# Patient Record
Sex: Male | Born: 1935 | Race: White | Hispanic: No | Marital: Married | Smoking: Former smoker
Health system: Southern US, Community
[De-identification: ages and names within clinical notes are randomized; demographics above are authoritative.]

## PROBLEM LIST (undated history)

## (undated) DIAGNOSIS — I5022 Chronic systolic (congestive) heart failure: Secondary | ICD-10-CM

## (undated) DIAGNOSIS — E785 Hyperlipidemia, unspecified: Secondary | ICD-10-CM

## (undated) DIAGNOSIS — I255 Ischemic cardiomyopathy: Secondary | ICD-10-CM

## (undated) DIAGNOSIS — N183 Chronic kidney disease, stage 3 unspecified: Secondary | ICD-10-CM

## (undated) DIAGNOSIS — M1990 Unspecified osteoarthritis, unspecified site: Secondary | ICD-10-CM

## (undated) DIAGNOSIS — Z95810 Presence of automatic (implantable) cardiac defibrillator: Secondary | ICD-10-CM

## (undated) DIAGNOSIS — E119 Type 2 diabetes mellitus without complications: Secondary | ICD-10-CM

## (undated) DIAGNOSIS — I2510 Atherosclerotic heart disease of native coronary artery without angina pectoris: Secondary | ICD-10-CM

## (undated) DIAGNOSIS — D649 Anemia, unspecified: Secondary | ICD-10-CM

## (undated) DIAGNOSIS — K635 Polyp of colon: Secondary | ICD-10-CM

## (undated) DIAGNOSIS — J449 Chronic obstructive pulmonary disease, unspecified: Secondary | ICD-10-CM

## (undated) DIAGNOSIS — T8859XA Other complications of anesthesia, initial encounter: Secondary | ICD-10-CM

## (undated) DIAGNOSIS — I10 Essential (primary) hypertension: Secondary | ICD-10-CM

## (undated) DIAGNOSIS — T4145XA Adverse effect of unspecified anesthetic, initial encounter: Secondary | ICD-10-CM

## (undated) DIAGNOSIS — I4891 Unspecified atrial fibrillation: Secondary | ICD-10-CM

## (undated) DIAGNOSIS — K219 Gastro-esophageal reflux disease without esophagitis: Secondary | ICD-10-CM

## (undated) DIAGNOSIS — I340 Nonrheumatic mitral (valve) insufficiency: Secondary | ICD-10-CM

## (undated) HISTORY — DX: Chronic kidney disease, stage 3 (moderate): N18.3

## (undated) HISTORY — DX: Ischemic cardiomyopathy: I25.5

## (undated) HISTORY — DX: Chronic kidney disease, stage 3 unspecified: N18.30

## (undated) HISTORY — DX: Unspecified atrial fibrillation: I48.91

## (undated) HISTORY — PX: CARDIAC DEFIBRILLATOR PLACEMENT: SHX171

## (undated) HISTORY — PX: INGUINAL HERNIA REPAIR: SUR1180

## (undated) HISTORY — DX: Atherosclerotic heart disease of native coronary artery without angina pectoris: I25.10

## (undated) HISTORY — DX: Gastro-esophageal reflux disease without esophagitis: K21.9

## (undated) HISTORY — PX: MITRAL VALVE REPAIR: SHX2039

## (undated) HISTORY — DX: Polyp of colon: K63.5

## (undated) HISTORY — DX: Chronic systolic (congestive) heart failure: I50.22

## (undated) HISTORY — PX: CERVICAL DISCECTOMY: SHX98

## (undated) HISTORY — DX: Hyperlipidemia, unspecified: E78.5

## (undated) HISTORY — DX: Anemia, unspecified: D64.9

## (undated) HISTORY — PX: ANTERIOR FUSION CERVICAL SPINE: SUR626

## (undated) HISTORY — PX: EXCISIONAL HEMORRHOIDECTOMY: SHX1541

---

## 1993-03-06 HISTORY — PX: CORONARY ARTERY BYPASS GRAFT: SHX141

## 1999-06-03 ENCOUNTER — Ambulatory Visit: Admission: RE | Admit: 1999-06-03 | Discharge: 1999-06-03 | Payer: Self-pay | Admitting: Cardiology

## 2000-03-06 HISTORY — PX: CORONARY ARTERY BYPASS GRAFT: SHX141

## 2000-03-27 ENCOUNTER — Ambulatory Visit (HOSPITAL_COMMUNITY): Admission: RE | Admit: 2000-03-27 | Discharge: 2000-03-28 | Payer: Self-pay | Admitting: Cardiology

## 2000-03-30 ENCOUNTER — Ambulatory Visit (HOSPITAL_COMMUNITY): Admission: RE | Admit: 2000-03-30 | Discharge: 2000-03-31 | Payer: Self-pay | Admitting: Cardiology

## 2001-05-30 ENCOUNTER — Inpatient Hospital Stay (HOSPITAL_COMMUNITY): Admission: AD | Admit: 2001-05-30 | Discharge: 2001-06-02 | Payer: Self-pay | Admitting: Cardiology

## 2001-11-04 ENCOUNTER — Inpatient Hospital Stay (HOSPITAL_COMMUNITY): Admission: EM | Admit: 2001-11-04 | Discharge: 2001-11-08 | Payer: Self-pay | Admitting: Emergency Medicine

## 2001-11-04 ENCOUNTER — Encounter: Payer: Self-pay | Admitting: Emergency Medicine

## 2001-11-06 ENCOUNTER — Encounter: Payer: Self-pay | Admitting: Cardiology

## 2001-12-11 ENCOUNTER — Ambulatory Visit (HOSPITAL_COMMUNITY): Admission: RE | Admit: 2001-12-11 | Discharge: 2001-12-12 | Payer: Self-pay | Admitting: Internal Medicine

## 2001-12-12 ENCOUNTER — Encounter: Payer: Self-pay | Admitting: Internal Medicine

## 2002-02-03 ENCOUNTER — Ambulatory Visit (HOSPITAL_COMMUNITY): Admission: RE | Admit: 2002-02-03 | Discharge: 2002-02-03 | Payer: Self-pay | Admitting: Cardiology

## 2002-02-03 ENCOUNTER — Encounter: Payer: Self-pay | Admitting: Cardiology

## 2002-08-19 ENCOUNTER — Ambulatory Visit (HOSPITAL_COMMUNITY): Admission: RE | Admit: 2002-08-19 | Discharge: 2002-08-19 | Payer: Self-pay | Admitting: Cardiology

## 2002-08-19 ENCOUNTER — Encounter: Payer: Self-pay | Admitting: Cardiology

## 2003-03-31 ENCOUNTER — Ambulatory Visit (HOSPITAL_COMMUNITY): Admission: RE | Admit: 2003-03-31 | Discharge: 2003-03-31 | Payer: Self-pay | Admitting: Cardiology

## 2003-06-04 ENCOUNTER — Inpatient Hospital Stay (HOSPITAL_COMMUNITY): Admission: EM | Admit: 2003-06-04 | Discharge: 2003-06-06 | Payer: Self-pay | Admitting: Emergency Medicine

## 2003-10-01 ENCOUNTER — Other Ambulatory Visit: Payer: Self-pay

## 2003-10-22 ENCOUNTER — Encounter: Admission: RE | Admit: 2003-10-22 | Discharge: 2003-10-22 | Payer: Self-pay | Admitting: Emergency Medicine

## 2004-04-07 ENCOUNTER — Ambulatory Visit: Payer: Self-pay | Admitting: Cardiology

## 2004-04-07 ENCOUNTER — Ambulatory Visit: Payer: Self-pay | Admitting: Internal Medicine

## 2004-06-08 ENCOUNTER — Ambulatory Visit: Payer: Self-pay | Admitting: Cardiology

## 2004-06-08 ENCOUNTER — Inpatient Hospital Stay (HOSPITAL_COMMUNITY): Admission: EM | Admit: 2004-06-08 | Discharge: 2004-06-20 | Payer: Self-pay | Admitting: *Deleted

## 2004-06-10 ENCOUNTER — Encounter: Payer: Self-pay | Admitting: Cardiology

## 2004-06-21 ENCOUNTER — Ambulatory Visit: Payer: Self-pay | Admitting: Cardiology

## 2004-06-23 ENCOUNTER — Ambulatory Visit: Payer: Self-pay | Admitting: Cardiology

## 2004-06-28 ENCOUNTER — Ambulatory Visit: Payer: Self-pay | Admitting: *Deleted

## 2004-06-29 ENCOUNTER — Ambulatory Visit: Payer: Self-pay | Admitting: Cardiology

## 2004-07-07 ENCOUNTER — Ambulatory Visit: Payer: Self-pay | Admitting: Internal Medicine

## 2004-07-14 ENCOUNTER — Inpatient Hospital Stay (HOSPITAL_COMMUNITY): Admission: EM | Admit: 2004-07-14 | Discharge: 2004-07-19 | Payer: Self-pay | Admitting: Emergency Medicine

## 2004-07-14 ENCOUNTER — Ambulatory Visit: Payer: Self-pay | Admitting: Internal Medicine

## 2004-07-15 ENCOUNTER — Ambulatory Visit: Payer: Self-pay | Admitting: Cardiology

## 2004-07-15 ENCOUNTER — Encounter: Payer: Self-pay | Admitting: Cardiology

## 2004-07-25 ENCOUNTER — Ambulatory Visit: Payer: Self-pay | Admitting: Cardiology

## 2004-08-18 ENCOUNTER — Ambulatory Visit: Payer: Self-pay | Admitting: Internal Medicine

## 2004-09-26 ENCOUNTER — Ambulatory Visit: Payer: Self-pay | Admitting: Cardiology

## 2004-12-22 ENCOUNTER — Ambulatory Visit: Payer: Self-pay | Admitting: Internal Medicine

## 2004-12-22 ENCOUNTER — Ambulatory Visit: Payer: Self-pay | Admitting: Cardiology

## 2005-04-25 ENCOUNTER — Ambulatory Visit: Payer: Self-pay | Admitting: Internal Medicine

## 2005-05-18 ENCOUNTER — Ambulatory Visit: Payer: Self-pay | Admitting: Cardiology

## 2005-06-13 ENCOUNTER — Ambulatory Visit (HOSPITAL_COMMUNITY): Admission: RE | Admit: 2005-06-13 | Discharge: 2005-06-13 | Payer: Self-pay | Admitting: Neurosurgery

## 2005-06-15 ENCOUNTER — Ambulatory Visit (HOSPITAL_COMMUNITY): Admission: RE | Admit: 2005-06-15 | Discharge: 2005-06-15 | Payer: Self-pay | Admitting: Neurosurgery

## 2005-07-10 ENCOUNTER — Ambulatory Visit: Payer: Self-pay | Admitting: Cardiology

## 2005-07-19 ENCOUNTER — Ambulatory Visit: Payer: Self-pay | Admitting: Cardiology

## 2005-07-28 ENCOUNTER — Ambulatory Visit (HOSPITAL_COMMUNITY): Admission: RE | Admit: 2005-07-28 | Discharge: 2005-07-28 | Payer: Self-pay | Admitting: Internal Medicine

## 2005-08-02 ENCOUNTER — Ambulatory Visit: Payer: Self-pay | Admitting: Internal Medicine

## 2005-08-02 ENCOUNTER — Inpatient Hospital Stay (HOSPITAL_COMMUNITY): Admission: RE | Admit: 2005-08-02 | Discharge: 2005-08-02 | Payer: Self-pay | Admitting: Internal Medicine

## 2005-08-21 ENCOUNTER — Ambulatory Visit: Payer: Self-pay | Admitting: Internal Medicine

## 2005-11-22 ENCOUNTER — Ambulatory Visit: Payer: Self-pay | Admitting: Cardiology

## 2006-03-01 ENCOUNTER — Ambulatory Visit: Payer: Self-pay | Admitting: Internal Medicine

## 2006-03-01 ENCOUNTER — Ambulatory Visit: Payer: Self-pay | Admitting: Cardiology

## 2006-03-08 ENCOUNTER — Ambulatory Visit: Payer: Self-pay | Admitting: Cardiology

## 2006-03-12 ENCOUNTER — Ambulatory Visit: Payer: Self-pay | Admitting: Cardiology

## 2006-03-19 ENCOUNTER — Ambulatory Visit: Payer: Self-pay | Admitting: Internal Medicine

## 2006-03-19 ENCOUNTER — Ambulatory Visit: Payer: Self-pay

## 2006-03-27 ENCOUNTER — Ambulatory Visit: Payer: Self-pay | Admitting: Internal Medicine

## 2006-03-27 ENCOUNTER — Ambulatory Visit: Payer: Self-pay | Admitting: *Deleted

## 2006-04-10 ENCOUNTER — Ambulatory Visit: Payer: Self-pay | Admitting: Cardiology

## 2006-04-10 ENCOUNTER — Ambulatory Visit: Payer: Self-pay | Admitting: Internal Medicine

## 2006-04-10 LAB — CONVERTED CEMR LAB
CO2: 29 meq/L (ref 19–32)
Chloride: 108 meq/L (ref 96–112)
Creatinine, Ser: 1.2 mg/dL (ref 0.4–1.5)
Eosinophils Relative: 1.2 % (ref 0.0–5.0)
Glucose, Bld: 160 mg/dL — ABNORMAL HIGH (ref 70–99)
HCT: 46.1 % (ref 39.0–52.0)
Hemoglobin: 15.7 g/dL (ref 13.0–17.0)
Neutrophils Relative %: 68 % (ref 43.0–77.0)
RBC: 4.79 M/uL (ref 4.22–5.81)
RDW: 16.1 % — ABNORMAL HIGH (ref 11.5–14.6)
Sodium: 141 meq/L (ref 135–145)
WBC: 5.8 10*3/uL (ref 4.5–10.5)

## 2006-04-17 ENCOUNTER — Ambulatory Visit (HOSPITAL_COMMUNITY): Admission: RE | Admit: 2006-04-17 | Discharge: 2006-04-17 | Payer: Self-pay | Admitting: Internal Medicine

## 2006-04-24 ENCOUNTER — Ambulatory Visit: Payer: Self-pay | Admitting: Cardiology

## 2006-05-08 ENCOUNTER — Ambulatory Visit: Payer: Self-pay | Admitting: Internal Medicine

## 2006-05-15 ENCOUNTER — Ambulatory Visit: Payer: Self-pay | Admitting: Internal Medicine

## 2006-05-15 ENCOUNTER — Ambulatory Visit (HOSPITAL_COMMUNITY): Admission: RE | Admit: 2006-05-15 | Discharge: 2006-05-15 | Payer: Self-pay | Admitting: Internal Medicine

## 2006-06-20 ENCOUNTER — Ambulatory Visit: Payer: Self-pay | Admitting: Pain Medicine

## 2006-06-26 ENCOUNTER — Ambulatory Visit: Payer: Self-pay | Admitting: Cardiology

## 2006-06-26 LAB — CONVERTED CEMR LAB
CO2: 30 meq/L (ref 19–32)
Creatinine, Ser: 1.2 mg/dL (ref 0.4–1.5)
Glucose, Bld: 133 mg/dL — ABNORMAL HIGH (ref 70–99)
Potassium: 3.9 meq/L (ref 3.5–5.1)
Sodium: 141 meq/L (ref 135–145)

## 2006-07-04 ENCOUNTER — Ambulatory Visit: Payer: Self-pay | Admitting: Pain Medicine

## 2006-07-05 ENCOUNTER — Ambulatory Visit: Payer: Self-pay | Admitting: Internal Medicine

## 2006-07-05 LAB — CONVERTED CEMR LAB
Chloride: 104 meq/L (ref 96–112)
Creatinine, Ser: 1.1 mg/dL (ref 0.4–1.5)
Glucose, Bld: 108 mg/dL — ABNORMAL HIGH (ref 70–99)
Magnesium: 2 mg/dL (ref 1.5–2.5)
Prothrombin Time: 21.5 s — ABNORMAL HIGH (ref 10.0–14.0)
Sodium: 141 meq/L (ref 135–145)

## 2006-07-10 ENCOUNTER — Inpatient Hospital Stay (HOSPITAL_COMMUNITY): Admission: AD | Admit: 2006-07-10 | Discharge: 2006-07-14 | Payer: Self-pay | Admitting: Internal Medicine

## 2006-07-10 ENCOUNTER — Ambulatory Visit: Payer: Self-pay | Admitting: Internal Medicine

## 2006-08-09 ENCOUNTER — Ambulatory Visit: Payer: Self-pay | Admitting: Cardiovascular Disease

## 2006-09-04 ENCOUNTER — Ambulatory Visit: Payer: Self-pay | Admitting: Cardiology

## 2006-10-02 ENCOUNTER — Ambulatory Visit: Payer: Self-pay | Admitting: Cardiology

## 2006-11-06 ENCOUNTER — Ambulatory Visit: Payer: Self-pay | Admitting: Internal Medicine

## 2006-12-24 ENCOUNTER — Ambulatory Visit: Payer: Self-pay | Admitting: Cardiology

## 2007-01-02 ENCOUNTER — Inpatient Hospital Stay (HOSPITAL_COMMUNITY): Admission: AD | Admit: 2007-01-02 | Discharge: 2007-01-05 | Payer: Self-pay | Admitting: Cardiology

## 2007-01-02 ENCOUNTER — Inpatient Hospital Stay (HOSPITAL_BASED_OUTPATIENT_CLINIC_OR_DEPARTMENT_OTHER): Admission: RE | Admit: 2007-01-02 | Discharge: 2007-01-02 | Payer: Self-pay | Admitting: Cardiology

## 2007-01-02 ENCOUNTER — Ambulatory Visit: Payer: Self-pay | Admitting: Cardiology

## 2007-01-10 ENCOUNTER — Ambulatory Visit: Payer: Self-pay | Admitting: Cardiology

## 2007-01-29 ENCOUNTER — Ambulatory Visit: Payer: Self-pay | Admitting: Cardiology

## 2007-02-12 ENCOUNTER — Ambulatory Visit: Payer: Self-pay | Admitting: Cardiology

## 2007-02-12 LAB — CONVERTED CEMR LAB
INR: 1.4 — ABNORMAL HIGH (ref 0.8–1.0)
Prothrombin Time: 14.4 s — ABNORMAL HIGH (ref 10.9–13.3)

## 2007-02-20 ENCOUNTER — Ambulatory Visit: Payer: Self-pay | Admitting: Cardiology

## 2007-02-20 LAB — CONVERTED CEMR LAB: aPTT: 32.3 s — ABNORMAL HIGH (ref 21.7–29.8)

## 2007-02-25 ENCOUNTER — Ambulatory Visit: Payer: Self-pay

## 2007-03-11 ENCOUNTER — Ambulatory Visit: Payer: Self-pay | Admitting: Cardiology

## 2007-03-29 ENCOUNTER — Ambulatory Visit: Payer: Self-pay | Admitting: Cardiology

## 2007-03-29 LAB — CONVERTED CEMR LAB
Basophils Relative: 0.7 % (ref 0.0–1.0)
CO2: 30 meq/L (ref 19–32)
Eosinophils Relative: 1.4 % (ref 0.0–5.0)
GFR calc Af Amer: 85 mL/min
Glucose, Bld: 153 mg/dL — ABNORMAL HIGH (ref 70–99)
Hemoglobin: 15.3 g/dL (ref 13.0–17.0)
Lymphocytes Relative: 31.8 % (ref 12.0–46.0)
Monocytes Absolute: 0.6 10*3/uL (ref 0.2–0.7)
Monocytes Relative: 12.8 % — ABNORMAL HIGH (ref 3.0–11.0)
Neutro Abs: 2.3 10*3/uL (ref 1.4–7.7)
Potassium: 3.9 meq/L (ref 3.5–5.1)
Prothrombin Time: 20.7 s — ABNORMAL HIGH (ref 10.9–13.3)
Sodium: 136 meq/L (ref 135–145)

## 2007-04-22 ENCOUNTER — Ambulatory Visit: Payer: Self-pay | Admitting: Cardiology

## 2007-05-20 ENCOUNTER — Ambulatory Visit: Payer: Self-pay | Admitting: Cardiology

## 2007-05-20 LAB — CONVERTED CEMR LAB
GFR calc Af Amer: 85 mL/min
GFR calc non Af Amer: 71 mL/min
Glucose, Bld: 141 mg/dL — ABNORMAL HIGH (ref 70–99)
Sodium: 141 meq/L (ref 135–145)

## 2007-07-03 ENCOUNTER — Encounter: Admission: RE | Admit: 2007-07-03 | Discharge: 2007-07-03 | Payer: Self-pay | Admitting: Neurosurgery

## 2007-07-17 ENCOUNTER — Ambulatory Visit (HOSPITAL_COMMUNITY): Admission: RE | Admit: 2007-07-17 | Discharge: 2007-07-17 | Payer: Self-pay | Admitting: Neurosurgery

## 2007-07-24 ENCOUNTER — Ambulatory Visit (HOSPITAL_COMMUNITY): Admission: RE | Admit: 2007-07-24 | Discharge: 2007-07-25 | Payer: Self-pay | Admitting: Neurosurgery

## 2007-08-05 ENCOUNTER — Ambulatory Visit: Payer: Self-pay | Admitting: Cardiology

## 2007-08-19 ENCOUNTER — Ambulatory Visit: Payer: Self-pay | Admitting: Internal Medicine

## 2007-11-05 ENCOUNTER — Ambulatory Visit: Payer: Self-pay | Admitting: Cardiology

## 2007-11-05 LAB — CONVERTED CEMR LAB
Calcium: 9.2 mg/dL (ref 8.4–10.5)
Creatinine, Ser: 1.2 mg/dL (ref 0.4–1.5)
GFR calc non Af Amer: 64 mL/min

## 2007-11-18 ENCOUNTER — Ambulatory Visit: Payer: Self-pay | Admitting: Cardiology

## 2007-12-05 ENCOUNTER — Encounter: Admission: RE | Admit: 2007-12-05 | Discharge: 2007-12-05 | Payer: Self-pay | Admitting: Otolaryngology

## 2007-12-26 ENCOUNTER — Ambulatory Visit: Payer: Self-pay | Admitting: Cardiology

## 2007-12-27 ENCOUNTER — Inpatient Hospital Stay (HOSPITAL_COMMUNITY): Admission: EM | Admit: 2007-12-27 | Discharge: 2007-12-31 | Payer: Self-pay | Admitting: Emergency Medicine

## 2008-01-07 ENCOUNTER — Ambulatory Visit: Payer: Self-pay | Admitting: Cardiology

## 2008-01-07 LAB — CONVERTED CEMR LAB
BUN: 33 mg/dL — ABNORMAL HIGH (ref 6–23)
Chloride: 101 meq/L (ref 96–112)
Eosinophils Absolute: 0.1 10*3/uL (ref 0.0–0.7)
Eosinophils Relative: 1.8 % (ref 0.0–5.0)
GFR calc non Af Amer: 64 mL/min
MCV: 97.1 fL (ref 78.0–100.0)
Monocytes Relative: 9 % (ref 3.0–12.0)
Neutrophils Relative %: 64.3 % (ref 43.0–77.0)
Platelets: 145 10*3/uL — ABNORMAL LOW (ref 150–400)
Potassium: 4.3 meq/L (ref 3.5–5.1)
RDW: 14.3 % (ref 11.5–14.6)
Sodium: 138 meq/L (ref 135–145)
WBC: 4.5 10*3/uL (ref 4.5–10.5)

## 2008-02-17 ENCOUNTER — Ambulatory Visit: Payer: Self-pay | Admitting: Cardiology

## 2008-04-22 ENCOUNTER — Encounter: Payer: Self-pay | Admitting: Internal Medicine

## 2008-05-04 ENCOUNTER — Ambulatory Visit: Payer: Self-pay | Admitting: Internal Medicine

## 2008-07-11 DIAGNOSIS — I2589 Other forms of chronic ischemic heart disease: Secondary | ICD-10-CM | POA: Insufficient documentation

## 2008-07-11 DIAGNOSIS — N259 Disorder resulting from impaired renal tubular function, unspecified: Secondary | ICD-10-CM | POA: Insufficient documentation

## 2008-07-11 DIAGNOSIS — I4891 Unspecified atrial fibrillation: Secondary | ICD-10-CM

## 2008-07-11 DIAGNOSIS — E785 Hyperlipidemia, unspecified: Secondary | ICD-10-CM | POA: Insufficient documentation

## 2008-07-11 DIAGNOSIS — E119 Type 2 diabetes mellitus without complications: Secondary | ICD-10-CM

## 2008-07-21 ENCOUNTER — Telehealth: Payer: Self-pay | Admitting: Cardiology

## 2008-07-22 ENCOUNTER — Encounter: Payer: Self-pay | Admitting: Physician Assistant

## 2008-07-22 ENCOUNTER — Inpatient Hospital Stay (HOSPITAL_COMMUNITY): Admission: AD | Admit: 2008-07-22 | Discharge: 2008-07-24 | Payer: Self-pay | Admitting: Cardiology

## 2008-07-22 ENCOUNTER — Ambulatory Visit: Payer: Self-pay | Admitting: Cardiology

## 2008-07-22 DIAGNOSIS — I25810 Atherosclerosis of coronary artery bypass graft(s) without angina pectoris: Secondary | ICD-10-CM | POA: Insufficient documentation

## 2008-07-23 ENCOUNTER — Encounter: Payer: Self-pay | Admitting: Cardiology

## 2008-08-17 ENCOUNTER — Ambulatory Visit: Payer: Self-pay | Admitting: Internal Medicine

## 2008-08-26 ENCOUNTER — Encounter: Payer: Self-pay | Admitting: Physician Assistant

## 2008-08-26 ENCOUNTER — Ambulatory Visit: Payer: Self-pay | Admitting: Internal Medicine

## 2008-08-27 ENCOUNTER — Encounter: Payer: Self-pay | Admitting: Cardiology

## 2008-09-10 LAB — CONVERTED CEMR LAB
CO2: 30 meq/L (ref 19–32)
Chloride: 102 meq/L (ref 96–112)
Creatinine, Ser: 1.4 mg/dL (ref 0.4–1.5)
Leukocytes, UA: NEGATIVE
Nitrite: NEGATIVE
Potassium: 4.4 meq/L (ref 3.5–5.1)
Pro B Natriuretic peptide (BNP): 161 pg/mL — ABNORMAL HIGH (ref 0.0–100.0)
Specific Gravity, Urine: 1.015 (ref 1.000–1.030)
Total Protein, Urine: NEGATIVE mg/dL
pH: 5 (ref 5.0–8.0)

## 2008-10-08 ENCOUNTER — Ambulatory Visit: Payer: Self-pay | Admitting: Cardiology

## 2008-10-13 LAB — CONVERTED CEMR LAB
CO2: 26 meq/L (ref 19–32)
Chloride: 103 meq/L (ref 96–112)
Potassium: 4.3 meq/L (ref 3.5–5.1)
Sodium: 141 meq/L (ref 135–145)

## 2008-11-23 ENCOUNTER — Ambulatory Visit: Payer: Self-pay | Admitting: Internal Medicine

## 2008-11-23 ENCOUNTER — Encounter: Payer: Self-pay | Admitting: Internal Medicine

## 2009-01-02 ENCOUNTER — Emergency Department: Payer: Medicare Other | Admitting: Unknown Physician Specialty

## 2009-01-20 ENCOUNTER — Ambulatory Visit: Payer: Self-pay | Admitting: Cardiology

## 2009-02-15 ENCOUNTER — Encounter: Payer: Self-pay | Admitting: Internal Medicine

## 2009-02-15 ENCOUNTER — Ambulatory Visit: Payer: Self-pay | Admitting: Internal Medicine

## 2009-02-17 LAB — CONVERTED CEMR LAB
BUN: 22 mg/dL (ref 6–23)
Chloride: 105 meq/L (ref 96–112)
GFR calc non Af Amer: 63.43 mL/min (ref >60)
Glucose, Bld: 258 mg/dL — ABNORMAL HIGH (ref 70–99)
Potassium: 4.1 meq/L (ref 3.5–5.1)
Sodium: 141 meq/L (ref 135–145)

## 2009-04-29 ENCOUNTER — Ambulatory Visit: Payer: Self-pay | Admitting: Cardiology

## 2009-05-11 LAB — CONVERTED CEMR LAB
CO2: 30 meq/L (ref 19–32)
Calcium: 9.6 mg/dL (ref 8.4–10.5)
Chloride: 102 meq/L (ref 96–112)
Creatinine, Ser: 1.2 mg/dL (ref 0.4–1.5)
Glucose, Bld: 178 mg/dL — ABNORMAL HIGH (ref 70–99)

## 2009-05-24 ENCOUNTER — Encounter: Payer: Self-pay | Admitting: Internal Medicine

## 2009-05-24 ENCOUNTER — Ambulatory Visit: Payer: Self-pay | Admitting: Internal Medicine

## 2009-07-28 ENCOUNTER — Ambulatory Visit: Payer: Self-pay | Admitting: Cardiology

## 2009-08-25 ENCOUNTER — Ambulatory Visit: Payer: Self-pay | Admitting: Internal Medicine

## 2009-08-25 DIAGNOSIS — I5022 Chronic systolic (congestive) heart failure: Secondary | ICD-10-CM

## 2009-11-16 ENCOUNTER — Encounter: Payer: Self-pay | Admitting: Internal Medicine

## 2009-11-16 ENCOUNTER — Ambulatory Visit: Payer: Self-pay | Admitting: Cardiology

## 2009-11-26 LAB — CONVERTED CEMR LAB
BUN: 19 mg/dL (ref 6–23)
Basophils Absolute: 0 10*3/uL (ref 0.0–0.1)
CO2: 29 meq/L (ref 19–32)
Calcium: 8.9 mg/dL (ref 8.4–10.5)
Eosinophils Absolute: 0.1 10*3/uL (ref 0.0–0.7)
GFR calc non Af Amer: 71.46 mL/min (ref >60)
Glucose, Bld: 189 mg/dL — ABNORMAL HIGH (ref 70–99)
HCT: 46.2 % (ref 39.0–52.0)
Hemoglobin: 15.7 g/dL (ref 13.0–17.0)
Lymphs Abs: 1.3 10*3/uL (ref 0.7–4.0)
MCHC: 34 g/dL (ref 30.0–36.0)
Neutro Abs: 3.7 10*3/uL (ref 1.4–7.7)
Platelets: 146 10*3/uL — ABNORMAL LOW (ref 150.0–400.0)
Potassium: 4.1 meq/L (ref 3.5–5.1)
RDW: 14.3 % (ref 11.5–14.6)
Sodium: 140 meq/L (ref 135–145)

## 2009-12-07 ENCOUNTER — Ambulatory Visit: Payer: Medicare Other | Admitting: Otolaryngology

## 2010-02-04 ENCOUNTER — Ambulatory Visit: Payer: Self-pay | Admitting: Internal Medicine

## 2010-02-04 ENCOUNTER — Encounter: Payer: Self-pay | Admitting: Internal Medicine

## 2010-02-18 ENCOUNTER — Encounter: Payer: Self-pay | Admitting: Cardiology

## 2010-02-18 ENCOUNTER — Ambulatory Visit: Payer: Self-pay | Admitting: Cardiology

## 2010-02-21 LAB — CONVERTED CEMR LAB
Basophils Absolute: 0 10*3/uL (ref 0.0–0.1)
CO2: 31 meq/L (ref 19–32)
Calcium: 9.4 mg/dL (ref 8.4–10.5)
Chloride: 96 meq/L (ref 96–112)
Eosinophils Relative: 1.9 % (ref 0.0–5.0)
Lymphocytes Relative: 19.3 % (ref 12.0–46.0)
Monocytes Relative: 6.9 % (ref 3.0–12.0)
Neutrophils Relative %: 71.6 % (ref 43.0–77.0)
Platelets: 167 10*3/uL (ref 150.0–400.0)
Potassium: 5 meq/L (ref 3.5–5.1)
RDW: 13.8 % (ref 11.5–14.6)
Sodium: 135 meq/L (ref 135–145)
WBC: 8.1 10*3/uL (ref 4.5–10.5)

## 2010-03-26 ENCOUNTER — Encounter: Payer: Self-pay | Admitting: Cardiology

## 2010-03-28 ENCOUNTER — Encounter: Payer: Self-pay | Admitting: Cardiology

## 2010-03-28 ENCOUNTER — Ambulatory Visit
Admission: RE | Admit: 2010-03-28 | Discharge: 2010-03-28 | Payer: Self-pay | Source: Home / Self Care | Attending: Cardiology | Admitting: Cardiology

## 2010-03-28 ENCOUNTER — Other Ambulatory Visit: Payer: Self-pay | Admitting: Cardiology

## 2010-03-28 LAB — BASIC METABOLIC PANEL
Calcium: 9.2 mg/dL (ref 8.4–10.5)
Chloride: 94 mEq/L — ABNORMAL LOW (ref 96–112)
Creatinine, Ser: 1.3 mg/dL (ref 0.4–1.5)
Sodium: 133 mEq/L — ABNORMAL LOW (ref 135–145)

## 2010-04-07 NOTE — Assessment & Plan Note (Signed)
Summary: f69m   Visit Type:  Follow-up Primary Provider:  Mila Merry  CC:  no complaints.  History of Present Illness: Doing alot better.  No chest pain.  Losing some weight.  Not sure why that is.  Has seen Dr. Mila Merry at [facility].  Has some throat pain, but that has improved.   Sometimes stays thirsty.  He does not check his sugars very much.  Not drinking alcohol in over twelve years.    Current Medications (verified): 1)  Benazepril Hcl 20 Mg Tabs (Benazepril Hcl) .... Take One Daily 2)  Furosemide 40 Mg Tabs (Furosemide) .... Take Two Times A Day 3)  Tikosyn 500 Mcg Caps (Dofetilide) .... Take Two Times A Day 4)  Klor-Con M20 20 Meq Cr-Tabs (Potassium Chloride Crys Cr) .... Take 11/2 Daily 5)  Digoxin 0.25 Mg Tabs (Digoxin) .... Take One Daily 6)  Glimepiride 4 Mg Tabs (Glimepiride) .... Take One Daily 7)  Isosorbide Dinitrate 30 Mg Tabs (Isosorbide Dinitrate) .... 1/2 By Mouth Daily 8)  Plavix 75 Mg Tabs (Clopidogrel Bisulfate) .... Take One Daily 9)  Bayer Aspirin 325 Mg Tabs (Aspirin) .... Take One Daily 10)  Metoprolol Tartrate 50 Mg Tabs (Metoprolol Tartrate) .Marland Kitchen.. 1 By Mouth Daily 11)  Triazolam 0.25 Mg Tabs (Triazolam) .... As Needed  Allergies (verified): 1)  ! Loni Muse  Past History:  Past Medical History: Last updated: 07/11/2008 ICD - IN SITU (ICD-V45.02) HYPERLIPIDEMIA-MIXED (ICD-272.4) DIABETES MELLITUS (ICD-250.00) RENAL INSUFFICIENCY (ICD-588.9) ATRIAL FIBRILLATION (ICD-427.31) CARDIOMYOPATHY, ISCHEMIC (ICD-414.8)    Vital Signs:  Patient profile:   75 year old male Height:      69 inches Weight:      196 pounds BMI:     29.05 Pulse rate:   59 / minute Resp:     16 per minute BP sitting:   144 / 88  (left arm)  Vitals Entered By: Marrion Coy, CNA (Jul 28, 2009 10:08 AM)  Physical Exam  General:  Well developed, well nourished, in no acute distress. Head:  normocephalic and atraumatic Eyes:  PERRLA/EOM intact; conjunctiva and  lids normal. Lungs:  Clear bilaterally to auscultation and percussion. Heart:  PMI.  Normal S1 and S2.  Pos S4.  No rub.  No sig murmur.   Abdomen:  Bowel sounds positive; abdomen soft and non-tender without masses, organomegaly, or hernias noted. No hepatosplenomegaly. Pulses:  pulses normal in all 4 extremities Extremities:  No clubbing or cyanosis.  No edema. Neurologic:  Alert and oriented x 3.   EKG  Procedure date:  07/28/2009  Findings:      atrioventricular pacing.   ICD Specifications Following MD:  Lewayne Bunting, MD     ICD Vendor:  [facility] [employer]     ICD Model Number:  [phone]     ICD Serial Number:  [id number] ICD DOI:  08/02/2005     ICD Implanting MD:  Lewayne Bunting, MD  Lead 1:    Location: RA     DOI: 12/11/2001     Model #: [id number]     Serial #: [id number]     Status: active Lead 2:    Location: RV     DOI: 12/11/2001     Model #: 3086     Serial #: [id number]     Status: active Lead 3:    Location: LV     DOI: 12/11/2001     Model #: 6295     Serial #: [id number]     Status: active  Indications::  ICM, CHF  Explantation Comments: 08-02-05 BSX H135 EXPLANTED  ICD Follow Up ICD Dependent:  No      Episodes Coumadin:  No  Brady Parameters Mode DDD     Lower Rate Limit:  60     Upper Rate Limit 115 PAV 160      Tachy Zones VF:  210     VT:  170     VT1:  130     Impression & Recommendations:  Problem # 1:  CAD, ARTERY BYPASS GRAFT (ICD-414.04) Symptoms better controlled at this point.    Denies chest pain.  Tolerating medications. His updated medication list for this problem includes:    Benazepril Hcl 20 Mg Tabs (Benazepril hcl) .Marland Kitchen... Take one daily    Isosorbide Dinitrate 30 Mg Tabs (Isosorbide dinitrate) .Marland Kitchen... 1/2 by mouth daily    Plavix 75 Mg Tabs (Clopidogrel bisulfate) .Marland Kitchen... Take one daily    Bayer Aspirin 325 Mg Tabs (Aspirin) .Marland Kitchen... Take one daily    Metoprolol Tartrate 50 Mg Tabs (Metoprolol tartrate) .Marland Kitchen... 1 by mouth daily  Problem # 2:  ATRIAL FIBRILLATION  (ICD-427.31) controlled at present time.  In NSR.  Refuses warfarin.  On DAPT.  Dr [name] controls Tikosyn. His updated medication list for this problem includes:    Tikosyn 500 Mcg Caps (Dofetilide) .Marland Kitchen... Take two times a day    Digoxin 0.25 Mg Tabs (Digoxin) .Marland Kitchen... Take one daily    Plavix 75 Mg Tabs (Clopidogrel bisulfate) .Marland Kitchen... Take one daily    Bayer Aspirin 325 Mg Tabs (Aspirin) .Marland Kitchen... Take one daily    Metoprolol Tartrate 50 Mg Tabs (Metoprolol tartrate) .Marland Kitchen... 1 by mouth daily  Orders: EKG w/ Interpretation (93000)  Problem # 3:  HYPERLIPIDEMIA-MIXED (ICD-272.4) Not on treatment.    Problem # 4:  DIABETES MELLITUS (ICD-250.00) Not well controlled, and patient not really on top of treatment.  May be moderately hyperglycemic.   His updated medication list for this problem includes:    Benazepril Hcl 20 Mg Tabs (Benazepril hcl) .Marland Kitchen... Take one daily    Glimepiride 4 Mg Tabs (Glimepiride) .Marland Kitchen... Take one daily    Bayer Aspirin 325 Mg Tabs (Aspirin) .Marland Kitchen... Take one daily  Patient Instructions: 1)  Your physician recommends that you schedule a follow-up appointment in: 4 months with Dr.Lorenia Hoston 2)  Dr.Taylor in [place] in [place] for AICD check.

## 2010-04-07 NOTE — Cardiovascular Report (Signed)
Summary: Office Visit   Office Visit   Imported By: Roderic Ovens 11/22/2009 15:48:51  _____________________________________________________________________  External Attachment:    Type:   Image     Comment:   External Document

## 2010-04-07 NOTE — Assessment & Plan Note (Signed)
Summary: f1y   Visit Type:  Follow-up Primary Provider:  Mila Merry   History of Present Illness: Corey [name] returns today for followup.  He is a very pleasant male with ischemic cardiomyopathy, congestive heart failure, and VT.  He has a history of ICD implantation, status post BiV ICD insertion most recently 3 years ago.  The patient returns today for followup. His only complaint today is that he is experiencing erectile dysfunction which was made worse with initiation of Isosorbide.   He has had no intercurrent ICD therapies.  Current Medications (verified): 1)  Benazepril Hcl 20 Mg Tabs (Benazepril Hcl) .... Take One Daily 2)  Furosemide 40 Mg Tabs (Furosemide) .... Take Two Times A Day 3)  Tikosyn 500 Mcg Caps (Dofetilide) .... Take Two Times A Day 4)  Klor-Con M20 20 Meq Cr-Tabs (Potassium Chloride Crys Cr) .... Take 11/2 Daily 5)  Digoxin 0.25 Mg Tabs (Digoxin) .... Take One Daily 6)  Glimepiride 4 Mg Tabs (Glimepiride) .... Take One Daily 7)  Isosorbide Dinitrate 30 Mg Tabs (Isosorbide Dinitrate) .... 1/2 By Mouth Daily 8)  Plavix 75 Mg Tabs (Clopidogrel Bisulfate) .... Take One Daily 9)  Aspirin 81 Mg Tbec (Aspirin) 10)  Metoprolol Tartrate 50 Mg Tabs (Metoprolol Tartrate) .... 1/2 Two Times A Day 11)  Triazolam 0.25 Mg Tabs (Triazolam) .... As Needed  Allergies (verified): 1)  ! Loni Muse  Past History:  Past Medical History: Last updated: 07/11/2008 ICD - IN SITU (ICD-V45.02) HYPERLIPIDEMIA-MIXED (ICD-272.4) DIABETES MELLITUS (ICD-250.00) RENAL INSUFFICIENCY (ICD-588.9) ATRIAL FIBRILLATION (ICD-427.31) CARDIOMYOPATHY, ISCHEMIC (ICD-414.8)    Past Surgical History: Last updated: 07/11/2008 CABG redo CABG mitral repair with an angioplasty ring inguinal hernia repair C3-C4 diskectomy.  ICD - Guidant - 07/2005  Family History: Last updated: 07/11/2008 Family History of Hypertension:   Social History: Last updated: 07/11/2008 Retired  Single  Tobacco  Use - Former.  Alcohol Use - no  Risk Factors: Smoking Status: quit (07/11/2008)  Review of Systems  The patient denies chest pain, syncope, dyspnea on exertion, and peripheral edema.    Vital Signs:  Patient profile:   75 year old male Height:      69 inches Pulse rate:   59 / minute BP sitting:   110 / 72  (left arm) Cuff size:   regular  Vitals Entered By: Bishop Dublin, CMA (August 25, 2009 12:04 PM)  Physical Exam  General:  Well developed, well nourished, in no acute distress. Head:  normocephalic and atraumatic Eyes:  PERRLA/EOM intact; conjunctiva and lids normal. Neck:  Some tenderness over posterior back.   Chest Wall:  MS scar. Well healed ICD incision. Lungs:  Clear bilaterally to auscultation with no wheezes, rales, or rhonchi. Heart:  PMI.  Normal S1 and S2.  Pos S4.  No rub.  No sig murmur.   Abdomen:  Bowel sounds positive; abdomen soft and non-tender without masses, organomegaly, or hernias noted. No hepatosplenomegaly. Msk:  Back normal, normal gait. Muscle strength and tone normal. Pulses:  pulses normal in all 4 extremities Extremities:  No clubbing or cyanosis.  No edema. Neurologic:  Alert and oriented x 3.    ICD Specifications Following MD:  Lewayne Bunting, MD     ICD Vendor:  [facility] [employer]     ICD Model Number:  [phone]     ICD Serial Number:  [id number] ICD DOI:  08/02/2005     ICD Implanting MD:  Lewayne Bunting, MD  Lead 1:    Location: RA  DOI: 12/11/2001     Model #: 1610     Serial #: [id number]     Status: active Lead 2:    Location: RV     DOI: 12/11/2001     Model #: 0981     Serial #: [id number]     Status: active Lead 3:    Location: LV     DOI: 12/11/2001     Model #: 4513     Serial #: [id number]     Status: active  Indications::  ICM, CHF  Explantation Comments: 08-02-05 BSX H135 EXPLANTED  ICD Follow Up Remote Check?  No Battery Voltage:  2.58 V     Charge Time:  8.7 seconds     Battery Est. Longevity:  MOL2 Underlying rhythm:  SR ICD  Dependent:  No       ICD Device Measurements Atrium:  Amplitude: 5.0 mV, Impedance: 646 ohms, Threshold: 1.4 V at 0.4 msec Right Ventricle:  Amplitude: 20.1 mV, Impedance: 845 ohms, Threshold: 0.4 V at 0.4 msec Left Ventricle:  Amplitude: 13.3 mV, Impedance: 819 ohms, Threshold: 2.4 V at 1.0 msec Shock Impedance: 49 ohms   Episodes MS Episodes:  0     Percent Mode Switch:  0     Coumadin:  No Shock:  0     ATP:  0     Nonsustained:  1     Atrial Pacing:  35%     Ventricular Pacing:  98%  Brady Parameters Mode DDD     Lower Rate Limit:  60     Upper Rate Limit 115 PAV 160      Tachy Zones VF:  210     VT:  170     VT1:  130     Tech Comments:  No parameter changes. Device function normal.  VT1 episodes 1:1.  ROV 3 months [facility]. Altha Harm, LPN  August 25, 2009 12:01 PM  MD Comments:  Agree with above.  Impression & Recommendations:  Problem # 1:  ICD - IN SITU (ICD-V45.02) His device is working normally and is at [employer]  Will recheck in several months.  Problem # 2:  CAD, ARTERY BYPASS GRAFT (ICD-414.04) He denies anginal symptoms.  Continue meds as below.  I will discuss continuation of Isosorbide with Dr. [name]. His updated medication list for this problem includes:    Benazepril Hcl 20 Mg Tabs (Benazepril hcl) .Marland Kitchen... Take one daily    Isosorbide Dinitrate 30 Mg Tabs (Isosorbide dinitrate) .Marland Kitchen... 1/2 by mouth daily    Plavix 75 Mg Tabs (Clopidogrel bisulfate) .Marland Kitchen... Take one daily    Aspirin 81 Mg Tbec (Aspirin)    Metoprolol Tartrate 50 Mg Tabs (Metoprolol tartrate) .Marland Kitchen... 1/2 two times a day  Problem # 3:  HYPERLIPIDEMIA-MIXED (ICD-272.4) Continue a low fat diet.  Problem # 4:  CHRONIC SYSTOLIC HEART FAILURE (ICD-428.22) He remains class 2 on meds below.  A low sodium diet is recommended. His updated medication list for this problem includes:    Benazepril Hcl 20 Mg Tabs (Benazepril hcl) .Marland Kitchen... Take one daily    Furosemide 40 Mg Tabs (Furosemide) .Marland Kitchen... Take two  times a day    Tikosyn 500 Mcg Caps (Dofetilide) .Marland Kitchen... Take two times a day    Digoxin 0.25 Mg Tabs (Digoxin) .Marland Kitchen... Take one daily    Isosorbide Dinitrate 30 Mg Tabs (Isosorbide dinitrate) .Marland Kitchen... 1/2 by mouth daily    Plavix 75 Mg Tabs (Clopidogrel bisulfate) .Marland Kitchen... Take one daily  Aspirin 81 Mg Tbec (Aspirin)    Metoprolol Tartrate 50 Mg Tabs (Metoprolol tartrate) .Marland Kitchen... 1/2 two times a day  Problem # 5:  ATRIAL FIBRILLATION (ICD-427.31) Continue Tikosyn and other meds.  He has maintained NSR on Tikosyn. His updated medication list for this problem includes:    Tikosyn 500 Mcg Caps (Dofetilide) .Marland Kitchen... Take two times a day    Digoxin 0.25 Mg Tabs (Digoxin) .Marland Kitchen... Take one daily    Plavix 75 Mg Tabs (Clopidogrel bisulfate) .Marland Kitchen... Take one daily    Aspirin 81 Mg Tbec (Aspirin)    Metoprolol Tartrate 50 Mg Tabs (Metoprolol tartrate) .Marland Kitchen... 1/2 two times a day

## 2010-04-07 NOTE — Procedures (Signed)
Summary: [facility]   Current Medications (verified): 1)  Benazepril Hcl 20 Mg Tabs (Benazepril Hcl) .... Take One Daily 2)  Furosemide 40 Mg Tabs (Furosemide) .... Take Two Times A Day 3)  Tikosyn 500 Mcg Caps (Dofetilide) .... Take Two Times A Day 4)  Klor-Con M20 20 Meq Cr-Tabs (Potassium Chloride Crys Cr) .... Take 11/2 Daily 5)  Digoxin 0.25 Mg Tabs (Digoxin) .... Take One Daily 6)  Glimepiride 4 Mg Tabs (Glimepiride) .... Take One Daily 7)  Plavix 75 Mg Tabs (Clopidogrel Bisulfate) .... Take One Daily 8)  Aspirin 81 Mg Tbec (Aspirin) 9)  Metoprolol Tartrate 50 Mg Tabs (Metoprolol Tartrate) .... 1/2 Two Times A Day 10)  Triazolam 0.25 Mg Tabs (Triazolam) .... As Needed  Allergies (verified): 1)  ! Loni Muse    ICD Specifications Following MD:  Lewayne Bunting, MD     ICD Vendor:  [facility] [employer]     ICD Model Number:  [phone]     ICD Serial Number:  [id number] ICD DOI:  08/02/2005     ICD Implanting MD:  Lewayne Bunting, MD  Lead 1:    Location: RA     DOI: 12/11/2001     Model #: 4401     Serial #: [id number]     Status: active Lead 2:    Location: RV     DOI: 12/11/2001     Model #: 0158     Serial #: [id number]     Status: active Lead 3:    Location: LV     DOI: 12/11/2001     Model #: 4513     Serial #: [id number]     Status: active  Indications::  ICM, CHF  Explantation Comments: 08-02-05 BSX H135 EXPLANTED  ICD Follow Up Remote Check?  No Battery Voltage:  2.57 V     Charge Time:  8.7 seconds     Battery Est. Longevity:  MOL2 Underlying rhythm:  SR ICD Dependent:  No       ICD Device Measurements Atrium:  Amplitude: 2.6 mV, Impedance: 629 ohms, Threshold: 1.4 V at 0.4 msec Right Ventricle:  Amplitude: 24 mV, Impedance: 791 ohms, Threshold: 0.4 V at 0.4 msec Left Ventricle:  Amplitude: 13.3 mV, Impedance: 793 ohms, Threshold: 2.4 V at 1.0 msec  Episodes MS Episodes:  0     Percent Mode Switch:  0     Coumadin:  No Shock:  0     ATP:  0     Nonsustained:  1     Atrial  Pacing:  38%     Ventricular Pacing:  98%  Brady Parameters Mode DDD     Lower Rate Limit:  60     Upper Rate Limit 115 PAV 160      Tachy Zones VF:  210     VT:  170     VT1:  130     Next Cardiology Appt Due:  05/05/2010 Tech Comments:  No parameter changes.  Device function normal.  ROV 3 months  clinic. Altha Harm, LPN  February 04, 2010 4:14 PM

## 2010-04-07 NOTE — Assessment & Plan Note (Signed)
Summary: f10m   Visit Type:  Follow-up Primary Provider:  Mila Merry  CC:  No complaints.  History of Present Illness: Corey [name] is doing pretty well.  He has minimal pain in the throat, his historic complaint over time.  In general, he is doing well.    Problems Prior to Update: 1)  Sinusitis, Acute  (ICD-461.9) 2)  Chronic Systolic Heart Failure  (ICD-428.22) 3)  Other Specified Urticaria  (ICD-708.8) 4)  Dysuria  (ICD-788.1) 5)  Cad, Artery Bypass Graft  (ICD-414.04) 6)  Tick Bite  (ICD-E906.4) 7)  Icd - in Situ  (ICD-V45.02) 8)  Hyperlipidemia-mixed  (ICD-272.4) 9)  Diabetes Mellitus  (ICD-250.00) 10)  Renal Insufficiency  (ICD-588.9) 11)  Atrial Fibrillation  (ICD-427.31) 12)  Cardiomyopathy, Ischemic  (ICD-414.8)  Current Medications (verified): 1)  Benazepril Hcl 20 Mg Tabs (Benazepril Hcl) .... Take One Daily 2)  Furosemide 40 Mg Tabs (Furosemide) .... Take Two Times A Day 3)  Tikosyn 500 Mcg Caps (Dofetilide) .... Take Two Times A Day 4)  Klor-Con M20 20 Meq Cr-Tabs (Potassium Chloride Crys Cr) .... Take One Tablet By Mouth Daily 5)  Digoxin 0.25 Mg Tabs (Digoxin) .... Take One Daily 6)  Glimepiride 4 Mg Tabs (Glimepiride) .... Take One Daily 7)  Plavix 75 Mg Tabs (Clopidogrel Bisulfate) .... Take One Daily 8)  Aspirin 81 Mg Tbec (Aspirin) 9)  Metoprolol Tartrate 50 Mg Tabs (Metoprolol Tartrate) .... 1/2 Two Times A Day 10)  Triazolam 0.25 Mg Tabs (Triazolam) .... As Needed  Allergies: 1)  ! * Anesthia  Vital Signs:  Patient profile:   75 year old male Height:      69 inches Weight:      201.50 pounds BMI:     29.86 Pulse rate:   60 / minute Pulse rhythm:   regular Resp:     18 per minute BP sitting:   130 / 67  (left arm) Cuff size:   large  Vitals Entered By: Vikki Ports (March 28, 2010 9:07 AM)  Physical Exam  General:  Well developed, well nourished, in no acute distress. Head:  normocephalic and atraumatic Eyes:  PERRLA/EOM intact;  conjunctiva and lids normal. Lungs:  Clear bilaterally to auscultation and percussion. Heart:  Normal S1 and S2.  Apical murmur 1-2/6.  No significant SEM.   Abdomen:  Bowel sounds positive; abdomen soft and non-tender without masses, organomegaly, or hernias noted. No hepatosplenomegaly. Pulses:  pulses normal in all 4 extremities Extremities:  No clubbing or cyanosis. Neurologic:  Alert and oriented x 3.   EKG  Procedure date:  03/28/2010  Findings:      atrial tracking and ventricular pacing.   ICD Specifications Following MD:  Lewayne Bunting, MD     ICD Vendor:  [facility] [employer]     ICD Model Number:  [phone]     ICD Serial Number:  [id number] ICD DOI:  08/02/2005     ICD Implanting MD:  Lewayne Bunting, MD  Lead 1:    Location: RA     DOI: 12/11/2001     Model #: 0347     Serial #: [id number]     Status: active Lead 2:    Location: RV     DOI: 12/11/2001     Model #: 3875     Serial #: [id number]     Status: active Lead 3:    Location: LV     DOI: 12/11/2001     Model #: 5188  Serial #: [id number]     Status: active  Indications::  ICM, CHF  Explantation Comments: 08-02-05 BSX H135 EXPLANTED  ICD Follow Up ICD Dependent:  No      Episodes Coumadin:  No  Brady Parameters Mode DDD     Lower Rate Limit:  60     Upper Rate Limit 115 PAV 160      Tachy Zones VF:  210     VT:  170     VT1:  130     Impression & Recommendations:  Problem # 1:  CAD, ARTERY BYPASS GRAFT (ICD-414.04) Continues to remain stable.  Very little symptoms at this point in time.  Will continue medical therapy.   His updated medication list for this problem includes:    Benazepril Hcl 20 Mg Tabs (Benazepril hcl) .Marland Kitchen... Take one daily    Plavix 75 Mg Tabs (Clopidogrel bisulfate) .Marland Kitchen... Take one daily    Aspirin 81 Mg Tbec (Aspirin)    Metoprolol Tartrate 50 Mg Tabs (Metoprolol tartrate) .Marland Kitchen... 1/2 two times a day  Orders: EKG w/ Interpretation (93000) TLB-BMP (Basic Metabolic Panel-BMET) (80048-METABOL)  Problem # 2:   CARDIOMYOPATHY, ISCHEMIC (ICD-414.8) remains on medical therapy, and stable. His updated medication list for this problem includes:    Benazepril Hcl 20 Mg Tabs (Benazepril hcl) .Marland Kitchen... Take one daily    Furosemide 40 Mg Tabs (Furosemide) .Marland Kitchen... Take two times a day    Tikosyn 500 Mcg Caps (Dofetilide) .Marland Kitchen... Take two times a day    Digoxin 0.25 Mg Tabs (Digoxin) .Marland Kitchen... Take one daily    Plavix 75 Mg Tabs (Clopidogrel bisulfate) .Marland Kitchen... Take one daily    Aspirin 81 Mg Tbec (Aspirin)    Metoprolol Tartrate 50 Mg Tabs (Metoprolol tartrate) .Marland Kitchen... 1/2 two times a day  Problem # 3:  ATRIAL FIBRILLATION (ICD-427.31) currently takes Tikosyn.  Refuses warfarin.  Did agree to DAPT, particularly in light of CAD. His updated medication list for this problem includes:    Tikosyn 500 Mcg Caps (Dofetilide) .Marland Kitchen... Take two times a day    Digoxin 0.25 Mg Tabs (Digoxin) .Marland Kitchen... Take one daily    Plavix 75 Mg Tabs (Clopidogrel bisulfate) .Marland Kitchen... Take one daily    Aspirin 81 Mg Tbec (Aspirin)    Metoprolol Tartrate 50 Mg Tabs (Metoprolol tartrate) .Marland Kitchen... 1/2 two times a day  Problem # 4:  RENAL INSUFFICIENCY (ICD-588.9) need to monitor carefully. Will check BMET.   Patient Instructions: 1)  Your physician recommends that you have lab work today: BMP 2)  Your physician recommends that you continue on your current medications as directed. Please refer to the Current Medication list given to you today. 3)  Your physician wants you to follow-up in:  3 MONTHS.  You will receive a reminder letter in the mail two months in advance. If you don't receive a letter, please call our office to schedule the follow-up appointment.

## 2010-04-07 NOTE — Cardiovascular Report (Signed)
Summary: Office Visit   Office Visit   Imported By: Roderic Ovens 02/14/2010 14:48:32  _____________________________________________________________________  External Attachment:    Type:   Image     Comment:   External Document

## 2010-04-07 NOTE — Assessment & Plan Note (Signed)
Summary: 3 month rov   Visit Type:  3 months follow up  CC:  No cardiac complains.  History of Present Illness: Overall patient has good days and bad days. He has some hurting in the throat, but otherwise has remained stable. Despite that, he would not want a cath at this point, and thinks that there are other things involved.  Denies any particular symptoms.               Current Medications (verified): 1)  Benazepril Hcl 20 Mg Tabs (Benazepril Hcl) .... Take One Daily 2)  Furosemide 40 Mg Tabs (Furosemide) .... Take Two Times A Day 3)  Tikosyn 500 Mcg Caps (Dofetilide) .... Take Two Times A Day 4)  Klor-Con M20 20 Meq Cr-Tabs (Potassium Chloride Crys Cr) .... Take 11/2 Daily 5)  Digoxin 0.25 Mg Tabs (Digoxin) .... Take One Daily 6)  Glimepiride 4 Mg Tabs (Glimepiride) .... Take One Daily 7)  Isosorbide Dinitrate 30 Mg Tabs (Isosorbide Dinitrate) .... Take 1/2 Daily Two Times A Day 8)  Plavix 75 Mg Tabs (Clopidogrel Bisulfate) .... Take One Daily 9)  Bayer Aspirin 325 Mg Tabs (Aspirin) .... Take One Daily 10)  Metoprolol Tartrate 50 Mg Tabs (Metoprolol Tartrate) .... 1/2 Tablet Once A Day 11)  Fish Oil 1000 Mg Caps (Omega-3 Fatty Acids) .... Take 1 Capsule By Mouth Once A Day  Allergies: 1)  ! * Anesthia  Vital Signs:  Patient profile:   75 year old male Height:      69 inches Weight:      210.25 pounds BMI:     31.16 Pulse rate:   60 / minute Pulse rhythm:   regular Resp:     18 per minute BP sitting:   132 / 76  (left arm) Cuff size:   large  Vitals Entered By: Vikki Ports (April 29, 2009 8:42 AM) CC: No cardiac complains Comments Pt. states he takes  Metoprolol tartrate 50mg  1/2 tablet daily   Physical Exam  General:  Well developed, well nourished, in no acute distress. Head:  normocephalic and atraumatic Eyes:  PERRLA/EOM intact; conjunctiva and lids normal. Neck:  Some tenderness over posterior back.   Chest Wall:  MS scar Lungs:  Clear bilaterally  to auscultation and percussion. Heart:  Mild SEM.  Normal S1 and S2.  Prom S4 gallop.  No S3 or rub Abdomen:  slight tenderness epigastrium.  No masses. Extremities:  No clubbing or cyanosis. Neurologic:  Alert and oriented x 3.   EKG  Procedure date:  04/29/2009  Findings:      atrial tracking and ventricular pacing   ICD Specifications Following MD:  Lewayne Bunting, MD     ICD Vendor:  [facility] [employer]     ICD Model Number:  [phone]     ICD Serial Number:  [id number] ICD DOI:  08/02/2005     ICD Implanting MD:  Lewayne Bunting, MD  Lead 1:    Location: RA     DOI: 12/11/2001     Model #: 0981     Serial #: [id number]     Status: active Lead 2:    Location: RV     DOI: 12/11/2001     Model #: 2956     Serial #: [id number]     Status: active Lead 3:    Location: LV     DOI: 12/11/2001     Model #: 4513     Serial #: [id number]     Status:  active  Indications::  ICM, CHF  Explantation Comments: 08-02-05 BSX H135 EXPLANTED  ICD Follow Up ICD Dependent:  No      Episodes Coumadin:  No  Brady Parameters Mode DDD     Lower Rate Limit:  60     Upper Rate Limit 115 PAV 160      Tachy Zones VF:  210     VT:  170     VT1:  130     Impression & Recommendations:  Problem # 1:  CAD, ARTERY BYPASS GRAFT (ICD-414.04) always difficult to know, dating back to late 80's, if symptoms are ischemic.  He is comfortable for now, and does not wish recath.  Continue to monitor and maintain sound medical regimen. His updated medication list for this problem includes:    Benazepril Hcl 20 Mg Tabs (Benazepril hcl) .Marland Kitchen... Take one daily    Isosorbide Dinitrate 30 Mg Tabs (Isosorbide dinitrate) .Marland Kitchen... Take 1/2 daily two times a day    Plavix 75 Mg Tabs (Clopidogrel bisulfate) .Marland Kitchen... Take one daily    Bayer Aspirin 325 Mg Tabs (Aspirin) .Marland Kitchen... Take one daily    Metoprolol Tartrate 50 Mg Tabs (Metoprolol tartrate) .Marland Kitchen... 1/2 tablet once a day  Orders: EKG w/ Interpretation (93000) TLB-BMP (Basic Metabolic Panel-BMET)  (80048-METABOL) TLB-BNP (B-Natriuretic Peptide) (83880-BNPR)  Problem # 2:  HYPERLIPIDEMIA-MIXED (ICD-272.4) No medical treatment at present.  Would favor repeat labs soon.  Problem # 3:  CARDIOMYOPATHY, ISCHEMIC (ICD-414.8) Multiple meds, currently class II symptoms. His updated medication list for this problem includes:    Benazepril Hcl 20 Mg Tabs (Benazepril hcl) .Marland Kitchen... Take one daily    Furosemide 40 Mg Tabs (Furosemide) .Marland Kitchen... Take two times a day    Tikosyn 500 Mcg Caps (Dofetilide) .Marland Kitchen... Take two times a day    Digoxin 0.25 Mg Tabs (Digoxin) .Marland Kitchen... Take one daily    Isosorbide Dinitrate 30 Mg Tabs (Isosorbide dinitrate) .Marland Kitchen... Take 1/2 daily two times a day    Plavix 75 Mg Tabs (Clopidogrel bisulfate) .Marland Kitchen... Take one daily    Bayer Aspirin 325 Mg Tabs (Aspirin) .Marland Kitchen... Take one daily    Metoprolol Tartrate 50 Mg Tabs (Metoprolol tartrate) .Marland Kitchen... 1/2 tablet once a day  Orders: TLB-BNP (B-Natriuretic Peptide) (83880-BNPR)  Problem # 4:  ATRIAL FIBRILLATION (ICD-427.31) maintaining NSR on maintenance therpay. His updated medication list for this problem includes:    Tikosyn 500 Mcg Caps (Dofetilide) .Marland Kitchen... Take two times a day    Digoxin 0.25 Mg Tabs (Digoxin) .Marland Kitchen... Take one daily    Plavix 75 Mg Tabs (Clopidogrel bisulfate) .Marland Kitchen... Take one daily    Bayer Aspirin 325 Mg Tabs (Aspirin) .Marland Kitchen... Take one daily    Metoprolol Tartrate 50 Mg Tabs (Metoprolol tartrate) .Marland Kitchen... 1/2 tablet once a day  Orders: EKG w/ Interpretation (93000) TLB-BMP (Basic Metabolic Panel-BMET) (80048-METABOL) TLB-BNP (B-Natriuretic Peptide) (83880-BNPR)  Patient Instructions: 1)  Your physician recommends that you schedule a follow-up appointment in: 3 MONTHS 2)  Your physician recommends that you have lab work today: BMP, BNP 3)  Your physician recommends that you continue on your current medications as directed. Please refer to the Current Medication list given to you today.

## 2010-04-07 NOTE — Assessment & Plan Note (Signed)
Summary: ROV   Visit Type:  Follow-up Primary Provider:  Mila Merry  CC:  Tiredness.  History of Present Illness: Patient is in for followup .  He is having some more hurting in the throat.  He had two bad spells, and took nitro.  He does not want to wait too long, but also does not want another cath.  Results of 2010 procedure reviewed in detail.  Dr. Excell Seltzer did IVUS with non obstruction changes.    Problems Prior to Update: 1)  Sinusitis, Acute  (ICD-461.9) 2)  Chronic Systolic Heart Failure  (ICD-428.22) 3)  Other Specified Urticaria  (ICD-708.8) 4)  Dysuria  (ICD-788.1) 5)  Cad, Artery Bypass Graft  (ICD-414.04) 6)  Tick Bite  (ICD-E906.4) 7)  Icd - in Situ  (ICD-V45.02) 8)  Hyperlipidemia-mixed  (ICD-272.4) 9)  Diabetes Mellitus  (ICD-250.00) 10)  Renal Insufficiency  (ICD-588.9) 11)  Atrial Fibrillation  (ICD-427.31) 12)  Cardiomyopathy, Ischemic  (ICD-414.8)  Current Medications (verified): 1)  Benazepril Hcl 20 Mg Tabs (Benazepril Hcl) .... Take One Daily 2)  Furosemide 40 Mg Tabs (Furosemide) .... Take Two Times A Day 3)  Tikosyn 500 Mcg Caps (Dofetilide) .... Take Two Times A Day 4)  Klor-Con M20 20 Meq Cr-Tabs (Potassium Chloride Crys Cr) .... Take 11/2 Daily 5)  Digoxin 0.25 Mg Tabs (Digoxin) .... Take One Daily 6)  Glimepiride 4 Mg Tabs (Glimepiride) .... Take One Daily 7)  Plavix 75 Mg Tabs (Clopidogrel Bisulfate) .... Take One Daily 8)  Aspirin 81 Mg Tbec (Aspirin) 9)  Metoprolol Tartrate 50 Mg Tabs (Metoprolol Tartrate) .... 1/2 Two Times A Day 10)  Triazolam 0.25 Mg Tabs (Triazolam) .... As Needed  Allergies: 1)  ! * Anesthia  Vital Signs:  Patient profile:   75 year old male Height:      69 inches Weight:      200.50 pounds BMI:     29.72 Pulse rate:   60 / minute Pulse rhythm:   irregular Resp:     18 per minute BP sitting:   118 / 80  (left arm) Cuff size:   large  Vitals Entered By: Vikki Ports (February 18, 2010 8:50 AM)  Physical  Exam  General:  Well developed, well nourished, in no acute distress. Head:  normocephalic and atraumatic Eyes:  PERRLA/EOM intact; conjunctiva and lids normal. Lungs:  Clear bilaterally to auscultation and percussion. Heart:  PMI non displaced.  S4 gallop.  Soft lateral murmur.  No rub.  No S3.   Abdomen:  No hepatosplenomegaly.   Pulses:  pulses normal in all 4 extremities Extremities:  No clubbing or cyanosis.  Absolutely no edema.  Neurologic:  Alert and oriented x 3.   EKG  Procedure date:  02/18/2010  Findings:      atrial tracing and ventricular pacing.   ICD Specifications Following MD:  Lewayne Bunting, MD     ICD Vendor:  [facility] [employer]     ICD Model Number:  [phone]     ICD Serial Number:  [id number] ICD DOI:  08/02/2005     ICD Implanting MD:  Lewayne Bunting, MD  Lead 1:    Location: RA     DOI: 12/11/2001     Model #: 0981     Serial #: [id number]     Status: active Lead 2:    Location: RV     DOI: 12/11/2001     Model #: 2956     Serial #: [id number]  Status: active Lead 3:    Location: LV     DOI: 12/11/2001     Model #: 1610     Serial #: [id number]     Status: active  Indications::  ICM, CHF  Explantation Comments: 08-02-05 BSX H135 EXPLANTED  ICD Follow Up ICD Dependent:  No      Episodes Coumadin:  No  Brady Parameters Mode DDD     Lower Rate Limit:  60     Upper Rate Limit 115 PAV 160      Tachy Zones VF:  210     VT:  170     VT1:  130     Impression & Recommendations:  Problem # 1:  CAD, ARTERY BYPASS GRAFT (ICD-414.04) see prior cath report.  Has significant disease, and graft deterioaration.  Discussed nitrates and recath, but he wants to defer for now.  He will be seen back in one month, or earlier if symptoms progress.  His updated medication list for this problem includes:    Benazepril Hcl 20 Mg Tabs (Benazepril hcl) .Marland Kitchen... Take one daily    Plavix 75 Mg Tabs (Clopidogrel bisulfate) .Marland Kitchen... Take one daily    Aspirin 81 Mg Tbec (Aspirin)    Metoprolol Tartrate  50 Mg Tabs (Metoprolol tartrate) .Marland Kitchen... 1/2 two times a day  Orders: EKG w/ Interpretation (93000) TLB-CBC Platelet - w/Differential (85025-CBCD) TLB-BMP (Basic Metabolic Panel-BMET) (80048-METABOL)  Problem # 2:  CARDIOMYOPATHY, ISCHEMIC (ICD-414.8) volume status looks good.  No hepatomegaly, JVD, or edema.  Looks good.  Check labs. His updated medication list for this problem includes:    Benazepril Hcl 20 Mg Tabs (Benazepril hcl) .Marland Kitchen... Take one daily    Furosemide 40 Mg Tabs (Furosemide) .Marland Kitchen... Take two times a day    Tikosyn 500 Mcg Caps (Dofetilide) .Marland Kitchen... Take two times a day    Digoxin 0.25 Mg Tabs (Digoxin) .Marland Kitchen... Take one daily    Plavix 75 Mg Tabs (Clopidogrel bisulfate) .Marland Kitchen... Take one daily    Aspirin 81 Mg Tbec (Aspirin)    Metoprolol Tartrate 50 Mg Tabs (Metoprolol tartrate) .Marland Kitchen... 1/2 two times a day  Orders: EKG w/ Interpretation (93000) TLB-CBC Platelet - w/Differential (85025-CBCD) TLB-BMP (Basic Metabolic Panel-BMET) (80048-METABOL)  Problem # 3:  HYPERLIPIDEMIA-MIXED (ICD-272.4) not on treatment by request.   Problem # 4:  ATRIAL FIBRILLATION (ICD-427.31) no recurrence, and on Tikosyn at direction of Dr. [name].  His updated medication list for this problem includes:    Tikosyn 500 Mcg Caps (Dofetilide) .Marland Kitchen... Take two times a day    Digoxin 0.25 Mg Tabs (Digoxin) .Marland Kitchen... Take one daily    Plavix 75 Mg Tabs (Clopidogrel bisulfate) .Marland Kitchen... Take one daily    Aspirin 81 Mg Tbec (Aspirin)    Metoprolol Tartrate 50 Mg Tabs (Metoprolol tartrate) .Marland Kitchen... 1/2 two times a day  Orders: EKG w/ Interpretation (93000) TLB-CBC Platelet - w/Differential (85025-CBCD) TLB-BMP (Basic Metabolic Panel-BMET) (80048-METABOL)  Patient Instructions: 1)  Your physician recommends that you schedule a follow-up appointment in: 1 MONTH 2)  Your physician recommends that you have lab work today: BMP and CBC 3)  Your physician recommends that you continue on your current medications as directed.  Please refer to the Current Medication list given to you today.

## 2010-04-07 NOTE — Assessment & Plan Note (Signed)
Summary: ROV   Visit Type:  Follow-up Primary [name]:  Corey [name]  CC:  No cardiac complains.  History of Present Illness: Doing well except for his sinus issues.  He gets lightheaded, which he thinks is from sinus.  He saw ENT yesterday, and saw Dr. Chestine Spore at [facility].  He is taking antibiotics.  He got a spray, and amoxacillin.  He has been staying at the house.  He also has severe neck pain posteriorly.  His nose runs continuously.  He was told he might have to have surgery.  He does not get lightheaded when he stands up, and no defib discharges.  He says this is all his sinuses, with head pain, pressure, etc.    Current Medications (verified): 1)  Benazepril Hcl 20 Mg Tabs (Benazepril Hcl) .... Take One Daily 2)  Furosemide 40 Mg Tabs (Furosemide) .... Take Two Times A Day 3)  Tikosyn 500 Mcg Caps (Dofetilide) .... Take Two Times A Day 4)  Klor-Con M20 20 Meq Cr-Tabs (Potassium Chloride Crys Cr) .... Take 11/2 Daily 5)  Digoxin 0.25 Mg Tabs (Digoxin) .... Take One Daily 6)  Glimepiride 4 Mg Tabs (Glimepiride) .... Take One Daily 7)  Isosorbide Dinitrate 30 Mg Tabs (Isosorbide Dinitrate) .... 1/2 Tablet Two Times A Day 8)  Plavix 75 Mg Tabs (Clopidogrel Bisulfate) .... Take One Daily 9)  Aspirin 81 Mg Tbec (Aspirin) 10)  Metoprolol Tartrate 50 Mg Tabs (Metoprolol Tartrate) .... 1/2 Two Times A Day 11)  Triazolam 0.25 Mg Tabs (Triazolam) .... As Needed  Allergies: 1)  ! * Anesthia  Vital Signs:  Patient profile:   75 year old male Height:      69 inches Weight:      200 pounds BMI:     29.64 Pulse rate:   60 / minute Pulse rhythm:   regular Resp:     18 per minute BP supine:   140 / 70 BP sitting:   134 / 67  (left arm) Cuff size:   large  Vitals Entered By: Vikki Ports (November 16, 2009 8:53 AM)  Physical Exam  General:  Well developed, well nourished, in no acute distress. Head:  normocephalic and atraumatic Eyes:  PERRLA/EOM intact; conjunctiva and  lids normal. Neck:  tenderness over back without erthyema.  NO carotid bruits.  Chest Wall:  no deformities or breast masses noted Lungs:  Clear bilaterally to auscultation and percussion. Heart:  PMI non displaced. Normal S1 and S2 with soft SEM.  No DM.  Soft apical murmur 1/6 Abdomen:  Bowel sounds positive; abdomen soft and non-tender without masses, organomegaly, or hernias noted. No hepatosplenomegaly. Msk:  See neck. Pulses:  pulses normal in all 4 extremities Extremities:  No clubbing or cyanosis. Neurologic:  Alert and oriented x 3.    ICD Specifications Following MD:  Lewayne Bunting, MD     ICD Vendor:  [facility] [employer]     ICD Model Number:  [phone]     ICD Serial Number:  [id number] ICD DOI:  08/02/2005     ICD Implanting MD:  Lewayne Bunting, MD  Lead 1:    Location: RA     DOI: 12/11/2001     Model #: 0981     Serial #: [id number]     Status: active Lead 2:    Location: RV     DOI: 12/11/2001     Model #: 2956     Serial #: [id number]     Status: active Lead 3:  Location: LV     DOI: 12/11/2001     Model #: 4513     Serial #: [id number]     Status: active  Indications::  ICM, CHF  Explantation Comments: 08-02-05 BSX H135 EXPLANTED  ICD Follow Up ICD Dependent:  No      Episodes Coumadin:  No  Brady Parameters Mode DDD     Lower Rate Limit:  60     Upper Rate Limit 115 PAV 160      Tachy Zones VF:  210     VT:  170     VT1:  130     Impression & Recommendations:  Problem # 1:  CAD, ARTERY BYPASS GRAFT (ICD-414.04)  no chest pain.  Doing well overall.  No chest pain or significant burning at this point The following medications were removed from the medication list:    Isosorbide Dinitrate 30 Mg Tabs (Isosorbide dinitrate) .Marland Kitchen... 1/2 tablet two times a day His updated medication list for this problem includes:    Benazepril Hcl 20 Mg Tabs (Benazepril hcl) .Marland Kitchen... Take one daily    Plavix 75 Mg Tabs (Clopidogrel bisulfate) .Marland Kitchen... Take one daily    Aspirin 81 Mg Tbec (Aspirin)     Metoprolol Tartrate 50 Mg Tabs (Metoprolol tartrate) .Marland Kitchen... 1/2 two times a day  Orders: EKG w/ Interpretation (93000) TLB-BMP (Basic Metabolic Panel-BMET) (80048-METABOL) TLB-CBC Platelet - w/Differential (85025-CBCD)  Problem # 2:  CHRONIC SYSTOLIC HEART FAILURE (ICD-428.22)  hemodynamically stable The following medications were removed from the medication list:    Isosorbide Dinitrate 30 Mg Tabs (Isosorbide dinitrate) .Marland Kitchen... 1/2 tablet two times a day His updated medication list for this problem includes:    Benazepril Hcl 20 Mg Tabs (Benazepril hcl) .Marland Kitchen... Take one daily    Furosemide 40 Mg Tabs (Furosemide) .Marland Kitchen... Take two times a day    Tikosyn 500 Mcg Caps (Dofetilide) .Marland Kitchen... Take two times a day    Digoxin 0.25 Mg Tabs (Digoxin) .Marland Kitchen... Take one daily    Plavix 75 Mg Tabs (Clopidogrel bisulfate) .Marland Kitchen... Take one daily    Aspirin 81 Mg Tbec (Aspirin)    Metoprolol Tartrate 50 Mg Tabs (Metoprolol tartrate) .Marland Kitchen... 1/2 two times a day  Orders: EKG w/ Interpretation (93000) TLB-BMP (Basic Metabolic Panel-BMET) (80048-METABOL) TLB-CBC Platelet - w/Differential (85025-CBCD)  Problem # 3:  ICD - IN SITU (ICD-V45.02) followed by Dr. [name].  Check done today, and ok.  Problem # 4:  HYPERLIPIDEMIA-MIXED (ICD-272.4) no meds  Problem # 5:  ATRIAL FIBRILLATION (ICD-427.31) Currently stable on Tikosyn. His updated medication list for this problem includes:    Tikosyn 500 Mcg Caps (Dofetilide) .Marland Kitchen... Take two times a day    Digoxin 0.25 Mg Tabs (Digoxin) .Marland Kitchen... Take one daily    Plavix 75 Mg Tabs (Clopidogrel bisulfate) .Marland Kitchen... Take one daily    Aspirin 81 Mg Tbec (Aspirin)    Metoprolol Tartrate 50 Mg Tabs (Metoprolol tartrate) .Marland Kitchen... 1/2 two times a day  Problem # 6:  SINUSITIS, ACUTE (ICD-461.9) Has alot of symptoms from this.    Patient Instructions: 1)  Your physician recommends that you schedule a follow-up appointment in: 3 MONTHS 2)  Your physician recommends that you have lab work  today: CBC, BMP 3)  Your physician has recommended you make the following change in your medication: STOP Isosorbide dinitrate 4)  Next device check in 3 MONTHS at [facility] office

## 2010-04-07 NOTE — Procedures (Signed)
Summary: pacer   Current Medications (verified): 1)  Benazepril Hcl 20 Mg Tabs (Benazepril Hcl) .... Take One Daily 2)  Furosemide 40 Mg Tabs (Furosemide) .... Take Two Times A Day 3)  Tikosyn 500 Mcg Caps (Dofetilide) .... Take Two Times A Day 4)  Klor-Con M20 20 Meq Cr-Tabs (Potassium Chloride Crys Cr) .... Take 11/2 Daily 5)  Digoxin 0.25 Mg Tabs (Digoxin) .... Take One Daily 6)  Glimepiride 4 Mg Tabs (Glimepiride) .... Take One Daily 7)  Isosorbide Dinitrate 30 Mg Tabs (Isosorbide Dinitrate) .... Take 1/2 Daily Two Times A Day 8)  Plavix 75 Mg Tabs (Clopidogrel Bisulfate) .... Take One Daily 9)  Bayer Aspirin 325 Mg Tabs (Aspirin) .... Take One Daily 10)  Metoprolol Tartrate 50 Mg Tabs (Metoprolol Tartrate) .... 1/2 Tablet Once A Day 11)  Fish Oil 1000 Mg Caps (Omega-3 Fatty Acids) .... Take 1 Capsule By Mouth Once A Day  Allergies (verified): 1)  ! Loni Muse    ICD Specifications Following MD:  Lewayne Bunting, MD     ICD Vendor:  [facility] [employer]     ICD Model Number:  [phone]     ICD Serial Number:  [id number] ICD DOI:  08/02/2005     ICD Implanting MD:  Lewayne Bunting, MD  Lead 1:    Location: RA     DOI: 12/11/2001     Model #: 5784     Serial #: [id number]     Status: active Lead 2:    Location: RV     DOI: 12/11/2001     Model #: 0158     Serial #: [id number]     Status: active Lead 3:    Location: LV     DOI: 12/11/2001     Model #: 4513     Serial #: [id number]     Status: active  Indications::  ICM, CHF  Explantation Comments: 08-02-05 BSX H135 EXPLANTED  ICD Follow Up Remote Check?  No Battery Voltage:  2.59 V     Charge Time:  9.2 seconds     Battery Est. Longevity:  MOL2 Underlying rhythm:  Brady @ 52 ICD Dependent:  No       ICD Device Measurements Atrium:  Amplitude: 6.5 mV, Impedance: 664 ohms, Threshold: 1.4 V at 0.4 msec Right Ventricle:  Amplitude: 24 mV, Impedance: 907 ohms, Threshold: 0.4 V at 0.4 msec Left Ventricle:  Amplitude: 12.4 mV, Impedance: 848 ohms, Threshold:  2.1 V at 1.0 msec  Episodes MS Episodes:  1     Percent Mode Switch:  2%     Coumadin:  No Shock:  0     ATP:  0     Nonsustained:  1     Atrial Pacing:  50%     Ventricular Pacing:  98%  Brady Parameters Mode DDD     Lower Rate Limit:  60     Upper Rate Limit 115 PAV 160      Tachy Zones VF:  210     VT:  170     VT1:  130     Next Cardiology Appt Due:  08/04/2009 Tech Comments:  RV 2.5@0 .4.  A-fib, total 2%, -coumadin, + plavix.  ROV 3 months with Dr. [name] in [place]. Altha Harm, LPN  May 24, 2009 9:29 AM  MD Comments:  Agree with above.

## 2010-04-07 NOTE — Cardiovascular Report (Signed)
Summary: Office Visit   Office Visit   Imported By: Roderic Ovens 06/03/2009 11:09:00  _____________________________________________________________________  External Attachment:    Type:   Image     Comment:   External Document

## 2010-04-07 NOTE — Procedures (Signed)
Summary: Cardiology Device Clinic   Allergies: 1)  ! Loni Muse   ICD Specifications Following MD:  Lewayne Bunting, MD     ICD Vendor:  [facility] [employer]     ICD Model Number:  [phone]     ICD Serial Number:  [id number] ICD DOI:  08/02/2005     ICD Implanting MD:  Lewayne Bunting, MD  Lead 1:    Location: RA     DOI: 12/11/2001     Model #: 0981     Serial #: [id number]     Status: active Lead 2:    Location: RV     DOI: 12/11/2001     Model #: 0158     Serial #: [id number]     Status: active Lead 3:    Location: LV     DOI: 12/11/2001     Model #: 4513     Serial #: [id number]     Status: active  Indications::  ICM, CHF  Explantation Comments: 08-02-05 BSX H135 EXPLANTED  ICD Follow Up Remote Check?  No Battery Voltage:  2.57 V     Charge Time:  8.7 seconds     Battery Est. Longevity:  MOL2 Underlying rhythm:  SR ICD Dependent:  No       ICD Device Measurements Atrium:  Amplitude: 4.6 mV, Impedance: 638 ohms, Threshold: 1.4 V at 0.4 msec Right Ventricle:  Amplitude: 22.8 mV, Impedance: 803 ohms, Threshold: 0.4 V at 0.4 msec Left Ventricle:  Amplitude: 12.8 mV, Impedance: 819 ohms, Threshold: 2.4 V at 1.0 msec  Episodes MS Episodes:  0     Percent Mode Switch:  0     Coumadin:  No Shock:  0     ATP:  0     Nonsustained:  2     Atrial Pacing:  32%     Ventricular Pacing:  99%  Brady Parameters Mode DDD     Lower Rate Limit:  60     Upper Rate Limit 115 PAV 160      Tachy Zones VF:  210     VT:  170     VT1:  130     Next Cardiology Appt Due:  02/03/2010 Tech Comments:  Corey [name] was checked today during his routine visit with Dr. [name].  LV reprogrammed 3.0@1 .0.  Device function normal.  ROV 3 months [place] clinic. Altha Harm, LPN  November 16, 2009 10:59 AM

## 2010-04-07 NOTE — Assessment & Plan Note (Signed)
Summary: PC/PAULA ONLY/GLC   Allergies: 1)  ! Loni Muse    ICD Specifications Following MD:  Lewayne Bunting, MD     ICD Vendor:  [facility] [employer]     ICD Model Number:  [phone]     ICD Serial Number:  [id number] ICD DOI:  08/02/2005     ICD Implanting MD:  Lewayne Bunting, MD  Lead 1:    Location: RA     DOI: 12/11/2001     Model #: 4087     Serial #: [id number]     Status: active Lead 2:    Location: RV     DOI: 12/11/2001     Model #: 0158     Serial #: [id number]     Status: active Lead 3:    Location: LV     DOI: 12/11/2001     Model #: 4513     Serial #: [id number]     Status: active  Indications::  ICM, CHF  Explantation Comments: 08-02-05 BSX H135 EXPLANTED  ICD Follow Up ICD Dependent:  No      Episodes Coumadin:  No  Brady Parameters Mode DDD     Lower Rate Limit:  60     Upper Rate Limit 115 PAV 160      Tachy Zones VF:  210     VT:  170     VT1:  130

## 2010-04-07 NOTE — Cardiovascular Report (Signed)
Summary: Office Visit   Office Visit   Imported By: Roderic Ovens 03/11/2009 14:16:18  _____________________________________________________________________  External Attachment:    Type:   Image     Comment:   External Document

## 2010-06-14 LAB — COMPREHENSIVE METABOLIC PANEL
AST: 24 U/L (ref 0–37)
Albumin: 3.8 g/dL (ref 3.5–5.2)
Alkaline Phosphatase: 38 U/L — ABNORMAL LOW (ref 39–117)
BUN: 25 mg/dL — ABNORMAL HIGH (ref 6–23)
CO2: 28 mEq/L (ref 19–32)
Chloride: 101 mEq/L (ref 96–112)
GFR calc non Af Amer: 58 mL/min — ABNORMAL LOW (ref >60)
Potassium: 4 mEq/L (ref 3.5–5.1)
Total Bilirubin: 0.8 mg/dL (ref 0.3–1.2)

## 2010-06-14 LAB — DIFFERENTIAL
Basophils Absolute: 0 10*3/uL (ref 0.0–0.1)
Basophils Relative: 1 % (ref 0–1)
Eosinophils Relative: 2 % (ref 0–5)
Monocytes Absolute: 0.4 10*3/uL (ref 0.1–1.0)
Neutro Abs: 2.6 10*3/uL (ref 1.7–7.7)

## 2010-06-14 LAB — GLUCOSE, CAPILLARY
Glucose-Capillary: 111 mg/dL — ABNORMAL HIGH (ref 70–99)
Glucose-Capillary: 124 mg/dL — ABNORMAL HIGH (ref 70–99)
Glucose-Capillary: 152 mg/dL — ABNORMAL HIGH (ref 70–99)
Glucose-Capillary: 191 mg/dL — ABNORMAL HIGH (ref 70–99)
Glucose-Capillary: 86 mg/dL (ref 70–99)

## 2010-06-14 LAB — CBC
HCT: 45.2 % (ref 39.0–52.0)
MCV: 95.8 fL (ref 78.0–100.0)
Platelets: 136 10*3/uL — ABNORMAL LOW (ref 150–400)
RBC: 4.7 MIL/uL (ref 4.22–5.81)
RBC: 4.8 MIL/uL (ref 4.22–5.81)
WBC: 4.6 10*3/uL (ref 4.0–10.5)
WBC: 5.7 10*3/uL (ref 4.0–10.5)

## 2010-06-14 LAB — HEPARIN LEVEL (UNFRACTIONATED): Heparin Unfractionated: <0.1 IU/mL — ABNORMAL LOW (ref 0.30–0.70)

## 2010-07-09 ENCOUNTER — Encounter: Payer: Self-pay | Admitting: Cardiology

## 2010-07-12 ENCOUNTER — Other Ambulatory Visit: Payer: Self-pay | Admitting: Internal Medicine

## 2010-07-12 ENCOUNTER — Ambulatory Visit (INDEPENDENT_AMBULATORY_CARE_PROVIDER_SITE_OTHER): Payer: Medicare Other | Admitting: Cardiology

## 2010-07-12 ENCOUNTER — Encounter: Payer: Self-pay | Admitting: Cardiology

## 2010-07-12 ENCOUNTER — Ambulatory Visit (INDEPENDENT_AMBULATORY_CARE_PROVIDER_SITE_OTHER): Payer: Medicare Other | Admitting: *Deleted

## 2010-07-12 DIAGNOSIS — I4891 Unspecified atrial fibrillation: Secondary | ICD-10-CM

## 2010-07-12 DIAGNOSIS — Z95810 Presence of automatic (implantable) cardiac defibrillator: Secondary | ICD-10-CM

## 2010-07-12 DIAGNOSIS — I428 Other cardiomyopathies: Secondary | ICD-10-CM

## 2010-07-12 DIAGNOSIS — I2589 Other forms of chronic ischemic heart disease: Secondary | ICD-10-CM

## 2010-07-12 DIAGNOSIS — I25810 Atherosclerosis of coronary artery bypass graft(s) without angina pectoris: Secondary | ICD-10-CM

## 2010-07-12 DIAGNOSIS — E785 Hyperlipidemia, unspecified: Secondary | ICD-10-CM

## 2010-07-12 LAB — BASIC METABOLIC PANEL
Chloride: 101 mEq/L (ref 96–112)
GFR: 85.83 mL/min (ref >60.00)
Potassium: 4.2 mEq/L (ref 3.5–5.1)
Sodium: 137 mEq/L (ref 135–145)

## 2010-07-12 NOTE — Progress Notes (Signed)
icd check in clinic  

## 2010-07-12 NOTE — Patient Instructions (Addendum)
Your physician recommends that you continue on your current medications as directed. Please refer to the Current Medication list given to you today.  Your physician recommends that you have lab work today: [facility]   Your physician recommends that you schedule a follow-up appointment in: 3 MONTHS with Dr [name] and 3 Months with device nurses

## 2010-07-13 NOTE — Progress Notes (Signed)
HPI:  Patient is doing pretty well.  Throat is not hurting so much as in the past.  His neck still bothers him, but otherwise he is doing pretty well.  Current Outpatient Prescriptions  Medication Sig Dispense Refill  . aspirin 81 MG tablet Take 81 mg by mouth daily.        . benazepril (LOTENSIN) 20 MG tablet Take 20 mg by mouth daily.        . clopidogrel (PLAVIX) 75 MG tablet Take 75 mg by mouth daily.        . digoxin (LANOXIN) 0.25 MG tablet Take 250 mcg by mouth daily.        Marland Kitchen dofetilide (TIKOSYN) 500 MCG capsule Take 500 mcg by mouth 2 (two) times daily.        . furosemide (LASIX) 40 MG tablet Take 40 mg by mouth 2 (two) times daily.        Marland Kitchen glimepiride (AMARYL) 4 MG tablet Take 4 mg by mouth daily before breakfast.        . metoprolol (LOPRESSOR) 50 MG tablet Take 25 mg by mouth. Takes 1 by mouth every day      . potassium chloride SA (K-DUR,KLOR-CON) 20 MEQ tablet Take 20 mEq by mouth daily.        . triazolam (HALCION) 0.25 MG tablet Take 0.25 mg by mouth at bedtime as needed.          Allergies  Allergen Reactions  . Ativan     Past Medical History  Diagnosis Date  . Atrial fibrillation   . CAD, ARTERY BYPASS GRAFT   . CARDIOMYOPATHY, ISCHEMIC   . Chronic systolic heart failure   . DIABETES MELLITUS   . Dysuria   . HYPERLIPIDEMIA-MIXED   . ICD - IN SITU   . RENAL INSUFFICIENCY     Past Surgical History  Procedure Date  . Coronary artery bypass graft   . Redo cabg   . Mitral valve repair     with an angioplasty ring  . Inguinal hernia repair   . Cervical discectomy     C3-C4  . Cardiac defibrillator placement     Guidant    Family History  Problem Relation Age of Onset  . Hypertension Other     History   Social History  . Marital Status: Widowed    Spouse Name: N/A    Number of Children: N/A  . Years of Education: N/A   Occupational History  . Not on file.   Social History Main Topics  . Smoking status: Former Smoker -- 1.5 packs/day for 40  years    Types: Cigarettes, Pipe    Quit date: 03/06/1985  . Smokeless tobacco: Not on file  . Alcohol Use: Not on file  . Drug Use: Not on file  . Sexually Active: Not on file   Other Topics Concern  . Not on file   Social History Narrative  . No narrative on file    ROS: Please see the HPI.  All other systems reviewed and negative.  PHYSICAL EXAM:  BP 130/78  Pulse 62  Resp 16  Ht 5\' 9"  (1.753 m)  Wt 202 lb (91.627 kg)  BMI 29.83 kg/m2  General: Well developed, well nourished, in no acute distress. Head:  Normocephalic and atraumatic. Median sternotomy well healed Neck: no JVD Lungs: Clear to auscultation and percussion. Heart: Normal S1 and S2.  No rub.  Apical murmur.  Abdomen:  Normal bowel sounds; soft; non  tender; no organomegaly Pulses: Pulses normal in all 4 extremities. Extremities: No clubbing or cyanosis. Trace edema Neurologic: Alert and oriented x 3.  EKG:  Atrial tracking, and ventricular pacing.   ASSESSMENT AND PLAN:

## 2010-07-13 NOTE — Assessment & Plan Note (Signed)
Not taking statins at this point in time.  May need further treatment, but he has not wanted to take.

## 2010-07-13 NOTE — Assessment & Plan Note (Signed)
Reasonable hemodynamics on appropriate treatment at this time.

## 2010-07-13 NOTE — Assessment & Plan Note (Signed)
Symptoms are stable at this point in time.  No change in treatment for the present.  Continue medications.

## 2010-07-13 NOTE — Assessment & Plan Note (Signed)
NSR.  Stays on Tikosyn at this point in time.

## 2010-07-19 NOTE — Assessment & Plan Note (Signed)
[facility]                            CARDIOLOGY OFFICE NOTE   Corey [name], Corey [name]                        MRN:          [id number]  DATE:12/24/2006                            DOB:          [birth year]    CHIEF COMPLAINT:  Throat pain.   HISTORY OF PRESENT ILLNESS:  Corey [name] is in for a follow-up visit.  To  briefly summarize he has developed recurrent throat discomfort.  This is  a the same symptom that he had 21 years ago when we first saw him in  clinic.  He says when he gets out and does much, he gets this discomfort  in the throat.  He is pretty sure this is what he has traditionally had  with ischemic issues.  He has not had heavy chest pain.  As a result of  this, he has had increasing symptoms recently and is concerned about  that.  He has also developed a rash, and this has been described as due  to chiggers.  However, the rash has been somewhat migratory and  predominantly is located over the right chest and flank but has been in  the left groin and down the left leg previously.   PAST MEDICAL HISTORY:  1. Anterior wall infarction in 1987 with coronary artery bypass graft      surgery in 1995 and redo CABG in April of 2006 with a mitral valve      annuloplasty at that time.  2. History of atrial fibrillation and atypical atrial flutter.  3. Class III New [place] Heart Association congestive heart failure.  4. Cardioversion in May on Ticotin.  5. Hypertension.  6. Diabetes.  7. 40-60% left internal carotid stenosis.  8. History of Bi-V ICD for monomorphic ventricular tachycardia.   MEDICAL DECISION MAKING:  1. Ticotin 500 mg b.i.d.  2. Klor-Con 20 mEq one daily.  3. Benazepril 20 mg daily.  4. Furosemide 40 mg b.i.d.  5. Digoxin 0.25 mg daily.  6. Actos 30 mg daily.  7. Glimepiride 2 mg daily.  8. Warfarin 7.5 daily.  9. Aspirin 81 mg daily.  10.Metoprolol 50 mg one half tablet p.o. b.i.d.   FAMILY HISTORY:  Father had  pneumonia  and rheumatoid arthritis.  Mother  had hypertension and died of kidney failure.  He has a sister who is  alive and well.   SOCIAL HISTORY:  The patient does not smoke.  I believe he is currently  single.  He is disabled.   PHYSICAL EXAMINATION:  He is alert and oriented, no distress.  Blood pressure 140/88, pulse 65.  LUNGS:  Lung fields clear.  The device side is well-healed.  HEENT:  Examination is unremarkable.  ABDOMEN:  There is a rash over the right flank region which is spotty  and slightly elevated.  There are no pustules.  The abdomen is soft.  EXTREMITIES:  Reveal no edema.  Pulses are intact.   EKG reveals sinus rhythm with Bi-V pacing, so there is a narrow complex  QRS after the pacer spikes.   IMPRESSION:  1. Coronary  artery disease with two prior coronary artery bypass graft      surgeries and recurrent symptomatology.  2. History of ischemic cardiomyopathy.  3. Status post IV pacemaker.  4. New rash.  5. Primary Coumadin therapy for atrial flutter/fibrillation.  6. Diabetes.  7. Hypertension.   PLAN:  We have talked about the various options.  Two things are needed:  1. He has had recurrent symptoms at this point.  My hope is that he is      stable, but I do think he likely needs repeat cardiac      catheterization.  He clearly is symptomatic, worried about this,      and these are similar to what he has had in the past.  2. The etiology of the rash is unclear.  He has seen a dermatologist      previously.  I will review this with Dr. [name], but I am not quite      sure if it is related to a drug or not.  This has been discussed      with the patient and he is agreeable.     Arturo Morton. [name], MD, [facility]  Electronically Signed    TDS/MedQ  DD: 12/24/2006  DT: 12/24/2006  Job #: [id number]

## 2010-07-19 NOTE — Cardiovascular Report (Signed)
Corey [name], Corey [name]                 ACCOUNT NO.:  [id number]   MEDICAL RECORD NO.:  [id number]          PATIENT TYPE:  INP   LOCATION:  6524                         FACILITY:  [facility]   PHYSICIAN:  Veverly Fells. Excell Seltzer, MD  DATE OF BIRTH:  [birth year]   DATE OF PROCEDURE:  12/30/2007  DATE OF DISCHARGE:                            CARDIAC CATHETERIZATION   PROCEDURE:  Percutaneous transluminal coronary angioplasty and stenting  of the saphenous vein graft to posterior descending artery, Angio-Seal  to the right femoral artery.   INDICATION:  Corey [name] is a 75 year old gentleman, who has had two  previous open heart surgeries with coronary artery bypass including  mitral valve repair.  He presented with his typical symptoms of  recurrent angina.  He underwent diagnostic catheterization that showed  moderately severe in-stent restenosis of stented segment in the  saphenous vein graft to the right PDA.  He also had a moderate lesion  just proximal to the stent with a 60% stenosis.  The remaining portions  of the long stented segment in the saphenous vein graft had only mild  areas of restenosis.  In the setting of his typical symptoms, he was  referred for PCI.  The patient has been pretreated with clopidogrel.  Angiomax was used for anticoagulation.   Risks and indications of the procedure were reviewed with the patient.  Informed consent was obtained.  The right groin was prepped, draped, and  anesthetized with 1% lidocaine.  Using modified Seldinger technique, a 6-  French sheath was placed in the right femoral artery.  A 6-French  multipurpose guide catheter was used.  Once the therapeutic ACT was  achieved, the filter wire was passed into the distal vessel and deployed  using normal technique.  I elected to predilate the vessel with a 3.0 x  20-mm balloon which was used mainly to measure lesion length.  The  balloon was inflated to 10 atmospheres.  Following balloon dilatation,  it  appeared that at least 20 mm would be needed to cover the segment of  tandem lesions.  A 3.5 x 23-mm Cypher stent was carefully positioned and  deployed at 16 atmospheres.  The stent appeared well expanded.  Following stenting, I elected to post dilate with a 4.0 x 20-mm Voyager  Mariposa balloon.  This was inflated to 16 atmospheres on two inflations to  cover the entire stented segment.  The patient tolerated the procedure  well.  There was TIMI-3 flow in the bypass graft following PCI.  The  filter was retrieved using the retrieval sheath.  Final angiography  demonstrated an excellent angiographic result.  There was less than 20%  residual stenosis at the area of previous 75% stenosis.  There were no  immediate complications.  The guide catheter was removed and a femoral  angiogram was performed.  This demonstrated a widely patent common  femoral artery with the sheath in the common femoral.  An Angio-Seal  device was used to close the arteriotomy.   ASSESSMENT:  Successful percutaneous coronary intervention of tandem  lesions in the saphenous vein graft to posterior  descending artery using  a 3.5 x 23-mm Cypher drug-eluting stent.   PLAN:  Continue dual antiplatelet therapy with aspirin and Plavix.      Veverly Fells. Excell Seltzer, MD  Electronically Signed     MDC/MEDQ  D:  12/30/2007  T:  12/31/2007  Job:  [id number]   cc:   Arturo Morton. [name], MD, [facility]

## 2010-07-19 NOTE — Assessment & Plan Note (Signed)
[facility]                            CARDIOLOGY OFFICE NOTE   Corey [name]                        MRN:          [id number]  DATE:11/05/2007                            DOB:          [birth year]    [name] is in for followup.  He has had somewhat of a discomfort in  his throat, but he says this is different from the classic throat  discomfort he has had with his angina in the past.  Importantly, the  patient has had neck surgery and he is pretty convinced that it is  coming from the scar from the neck.  He is having trouble swallowing and  he has had some discussion with the neurosurgeon about this.  He says  this is distinctly different from what he has had previously.  Overall,  he has remained stable from a cardiac standpoint.  The patient decided  on his own that he was not going to take Coumadin again nor was he going  to take Plavix and remains on a single aspirin a day.  He is on Tikosyn,  which is prescribed by Dr. [name] for his atrial arrhythmias.   CURRENT MEDICATIONS:  1. Metoprolol tartrate 50 mg one-half tablet p.o. b.i.d.  2. Klor-Con 20 mEq one and a half tablets daily.  3. Vitamin D daily.  4. Aspirin 325 mg daily.  5. Glimepiride 4 mg daily.  6. Tikosyn 500 mg b.i.d.  7. Benazepril 20 mg daily.  8. Furosemide 40 mg b.i.d.  9. Digoxin 0.25 mg daily.  10.Actos 30 mg daily.  11.Glimepiride 2 mg daily.  12.Fish oil 1200 mg.   PHYSICAL EXAMINATION:  GENERAL:  He is alert and oriented.  VITAL SIGNS:  Blood pressure is 140/80, the pulse is 60.  NECK:  Demonstrates a surgical scar in the neck.  LUNGS:  Lung fields are actually clear to auscultation and percussion.  CARDIAC:  Rhythm is regular.  She has soft apical systolic murmur.   The electrocardiogram demonstrates atrioventricular pacing.  The QT is  532 msec, not too dissimilar from before.   IMPRESSION:  1. Coronary artery disease status post coronary artery bypass  graft      surgery and subsequent stenting of the vein grafts.  2. Status post implantation of implantable defibrillator.  3. Status post surgery.   PLAN:  1. Return to clinic in 2 months.  2. Basic metabolic profile.  3. Continue followup in EP Clinic.     Arturo Morton. [name], MD, [facility]  Electronically Signed    TDS/MedQ  DD: 11/05/2007  DT: 11/06/2007  Job #: [id number]

## 2010-07-19 NOTE — Assessment & Plan Note (Signed)
[facility]                            CARDIOLOGY OFFICE NOTE   Cord, Wilczynski ANTHONEE GELIN                        MRN:          [id number]  DATE:01/29/2007                            DOB:          [birth year]    Mr. Langenbach is in for a followup visit.  Briefly summarized, he is  actually doing quite well.  The burning in his throat has completely  resolved.  He denies any ongoing chest pain or shortness of breath.   PHYSICAL EXAMINATION:  Today, on examination, blood pressure is 130/70,  pulse is 76.  The lung fields are clear.  The cardiac rhythm is regular.   Overall he continues to get along well.  We have him on 3.75 mg of  Coumadin daily.  Based on this, he will continue and our goal is to have  the INR just at 2.  We do not want it any higher or any significantly  lower.  He will need to remain on aspirin and Plavix for a minimum of 3  months, and then I will continue aspirin indefinitely and discontinue  the Plavix.  This will coincide with the labeling requirements using the  Cypher stent.  Although I would like to keep him on it for a year, the  risk of bleeding will be substantial.     Arturo Morton. [name], MD, [facility]  Electronically Signed    TDS/MedQ  DD: 01/29/2007  DT: 01/29/2007  Job #: [id number]

## 2010-07-19 NOTE — Op Note (Signed)
NAME:  TYLON, KEMMERLING                 ACCOUNT NO.:  [id number]   MEDICAL RECORD NO.:  [id number]          PATIENT TYPE:  OIB   LOCATION:  3528                         FACILITY:  [facility]   PHYSICIAN:  Hewitt Shorts, M.D.DATE OF BIRTH:  07/25/1935   DATE OF PROCEDURE:  07/24/2007  DATE OF DISCHARGE:                               OPERATIVE REPORT   PREOPERATIVE DIAGNOSES:  1. C3-C4 cervical disk herniation.  2. Cervical spondyloses.  3. Cervical degenerative disease.  4. Cervical stenosis.   POSTOPERATIVE DIAGNOSES:  1. C3-C4 cervical disk herniation.  2. Cervical spondyloses.  3. Cervical degenerative disease.  4. Cervical stenosis.   PROCEDURE:  C3-C4 anterior cervical diskectomy arthrodesis with  allograft and Tether cervical plating.   SURGEON:  Hewitt Shorts, MD   ASSISTANT:  Evern Bio, RN and Coletta Memos, MD   ANESTHESIA:  General endotracheal.   INDICATIONS FOR PROCEDURE:  The patient is a 75 year old man who  presented with incapacitating neck pain and headache.  He has been  studied with x-rays and cervical myelogram and post myelogram CT scan.  He has multilevel spondylosis, degenerative disk disease; however,  myelogram and post myelogram CT scan show significant stenosis at C3-C4  level multifactorial in nature, and it was felt that this was the most  likely source of the neck pain and headache, and therefore, the patient  was admitted now for single-level anterior cervical diskectomy and  fusion.   PROCEDURE:  The patient was brought to the operating room and placed  under general endotracheal anesthesia.  The patient was placed in 10  pounds of Holter traction.  The neck was prepped with Betadine soap and  solution and draped in a sterile fashion.  A horizontal incision was  made in the left side of the neck.  The line of the incision was  infiltrated with local anesthetic with epinephrine.  Dissection was  carried down through the subcutaneous  tissue and platysma.  Bipolar  electrocautery was used to maintain hemostasis.  Dissection was carried  down through an avascular plane leaving the sternocleidomastoid, carotid  artery, and jugular vein laterally and trachea and esophagus medially.  The ventral aspect of the vertebral column was identified and localizing  x-rays were taken, and the C3-C4 intervertebral disk space identified.  Diskectomy was begun with incision of the annulus and continued with  microcurettes and pituitary rongeurs.  Anterior osteophytic overgrowth  was removed using an osteophyte removal tool, and then the microscope  was draped and brought into the field to provide additional navigation,  illumination, and visualization and the remainder of the decompression  was performed using microdissection and microsurgical technique.  The  cauterized endplates were removed using microcurettes along with the X-  Max drill and posterior osteophytic overgrowth was removed using X-Max  drill along with a 2-mm Kerrison punch with a central footplate.  The  posterior longitudinal ligament was carefully removed, and we were able  to decompress the spinal canal and thecal sac.  We then turned our  attention to the neural foramina, and osteophytic overgrowth was  carefully  removed.  We examined Micro-Hook and good decompression was  achieved.  The wound was irrigated with bacitracin solution and checked  for hemostasis.  We measured the height of the intravertebral disk space  and selected an 8-mm interbody implant, it was allograft bone.  We  hydrated with saline solution and then we carefully positioned it in the  intervertebral disk space.  We then discontinued the cervical traction  and selected a 14-mm Tether cervical plate.  It was positioned over the  fusion construct.  It was secured to the vertebra with 4 x 14-mm  variable-angle screws, placing a pair of screws at each level.  The  screws were placed in the  alternating fashion.  The screws holes were  drilled and the screws were placed and then final tightening was done  with all 4 screws.  X-rays showed the interbody graft, plate, and screws  all to be in good position and the alignment was good.  The wound was  irrigated with bacitracin solution, checked for hemostasis which was  confirmed, and once the hemostasis was confirmed, we proceeded with  closure.  The platysma was closed with interrupted inverted 2-0 undyed  Vicryl sutures.  The subcutaneous and subcuticular layer were closed  with interrupted inverted 3-0 undyed Vicryl sutures.  The skin was  reapproximated with Dermabond.  The procedure was tolerated well.  The  estimated blood loss was 25 mL.  Sponge and needle count correct.  Following the surgery, the patient was placed in a soft cervical collar,  to be reversed from the anesthetic, extubated, and transferred to the  recovery room for further care.      Hewitt Shorts, M.D.  Electronically Signed     RWN/MEDQ  D:  07/24/2007  T:  07/25/2007  Job:  [id number]

## 2010-07-19 NOTE — Progress Notes (Signed)
[facility] OFFICE NOTE   NAME:Corey [name], Corey [name]                        MRN:          [id number]  DATE:08/19/2007                            DOB:          1935/06/26    Mr. Knierim returns today for followup.  He is a very pleasant male with  an ischemic cardiomyopathy and congestive heart failure, status post ICD  implantation.  He has a biventricular device in place.  He has a history  of atrial fibrillation and is maintained very nicely in sinus rhythm on  Tikosyn.  He has been reluctant to take Coumadin, though he has had  it  in the past.  This was discontinued several months ago.  He denies any  worsening diabetic symptoms.  He does have dyspnea on exertion, though  he overall has improved.  He denies peripheral edema.   MEDICATIONS:  1. Tikosyn 500 twice daily.  2. Klor-Con 20 mEq 1-1/2 tablet daily.  3. Metoprolol tartrate 50 mg daily.  4. Benazepril 20 mg daily.  5. Furosemide 40 twice daily.  6. Digoxin 0.25 daily.  7. Actos 30 a day.  8. Glyburide 2 mg daily.  9. Multiple vitamins.   PHYSICAL EXAMINATION:  GENERAL:  He is a pleasant well-appearing elderly  man in no distress.  VITAL SIGNS :  Blood pressure was 137/76, the pulse was 67 and regular,  the respirations were 18, and the weight was not recorded.  NECK:  No jugular venous distention.  He is status post anterior  cervical spine surgery with a well-healing scar.  LUNGS:  Clear bilaterally to auscultation.  No wheezes, rales, or  rhonchi are present.  CARDIOVASCULAR:  Regular rate and rhythm.  Normal S1 and S2.  ABDOMEN:  Soft and nontender.  There was no organomegaly.  EXTREMITIES:  Demonstrated trace peripheral edema on the right, none on  the left.  There is no cyanosis or clubbing.  NEUROLOGIC:  Alert and oriented x3.  Cranial nerves intact.   Interrogation of his defibrillator demonstrates a Guidant Contact H-175.  P-waves  were 4.  R-waves 23.  The impedance is 583 in the A, 754 in the  V, and 721 in the LV.  The threshold 1.2 at 0.4 in the A, 0.4 in the RV,  and 2.6 at 1 in the LV.  The battery voltage was 3.1 volts.  There are  no intercurrent ICE therapies.  He has had one episode of Afib.  He is  100% V-paced and 30% A-paced.   IMPRESSION:  1. Ischemic cardiomyopathy.  2. Congestive heart failure.  3. Status post biventricular implantable cardioverter-defibrillator      insertion.  4. Paroxysmal atrial fibrillation.  5. Tikosyn therapy, maintaining sinus rhythm.  6. Coumadin intolerance/refusal.   DISCUSSION:  Mr. Taha is stable.  His heart failure is well  controlled.  He continues to not want to be on Coumadin.  We will plan  to see the patient back in the office for followup in 6 months and 3  months in our device clinic.     [name]. [name], MD  Electronically Signed  GWT/MedQ  DD: 08/19/2007  DT: 08/20/2007  Job #: [id number]   cc:   Mila Merry

## 2010-07-19 NOTE — Assessment & Plan Note (Signed)
[facility]                         ELECTROPHYSIOLOGY OFFICE NOTE   MISSAEL, FERRARI                        MRN:          [id number]  DATE:11/06/2006                            DOB:          [birth year]    Mr. Busta returns today for follow-up.  He is a very pleasant man with  a history of an ischemic cardiomyopathy, congestive heart failure,  atrial fibrillation, who is status post initiation of Tikosyn and DC  cardioversion.  He returns today for follow-up and has done well.  He  denies chest pain.  He denies shortness of breath.  His heart failure is  markedly improved, having gone from class 3B to class 1-2.   PHYSICAL EXAMINATION:  GENERAL:  He is a pleasant, well-appearing man in  no acute distress.  VITAL SIGNS:  Blood pressure 130/80, pulse 80 and regular, respirations  18, weight 205 pounds.  NECK:  No jugular venous distention.  LUNGS:  Clear to auscultation bilaterally.  No wheezes, rales, or  rhonchi were present.  CARDIOVASCULAR:  Regular rate and rhythm with normal S1 and S2.  EXTREMITIES:  No edema.  The pulses were 2+ and symmetric.   MEDICATIONS:  1. Tikosyn 500 mg twice daily.  2. Potassium supplement.  3. Metoprolol 50 mg a day.  4. Benazepril 20 mg a day.  5. Furosemide 40 mg twice a day.  6. Digoxin 0.25 mg daily.  7. Actos 45 mg daily.  8. Glimepiride 2 mg daily.  9. Warfarin 7.5 mg daily.  10.Aspirin 81 mg daily.  11.Multivitamin.   Interrogation of his defibrillator demonstrates a Guidant Bi-V device  with P and R waves of 5 and 21.  The impedance 621 in the atrium and 831  in the ventricle.  Threshold of 1.4 at 0.5 in the atrium and 0.4 at 0.4  in the right ventricle and 2.8 at 0.8 in the left ventricle.  Battery  voltage was 3.18 volts.  There were no intercurrent IC therapies and no  evidence of atrial fibrillation.  He was 99% V-paced.   IMPRESSION:  1. Ischemic cardiomyopathy.  2. Congestive heart failure  previously class 3 and now class 2.  3. Hypertension.  4. Chronic Coumadin therapy.   DISCUSSION:  Overall Mr. Daughety continues to do well after DC  cardioversion and has maintained sinus rhythm very nicely.  His heart  failure is much improved.  We will plan to see him back for ICD check in  3 months and I will see him back in 6 months.     [name]. [name], MD  Electronically Signed    GWT/MedQ  DD: 11/06/2006  DT: 11/06/2006  Job #: [id number]   cc:   Mila Merry

## 2010-07-19 NOTE — Assessment & Plan Note (Signed)
[facility]                            CARDIOLOGY OFFICE NOTE   Taim, Wurm DORA CLAUSS                        MRN:          [id number]  DATE:01/07/2008                            DOB:          May 26, 1935    Mr. Oleski is in for followup today after his 72th birthday.  Since  discharge from the hospital, there are hurting in the throat, his  classic anginal equivalent has resolved.  He has not been having any  exertional or progressive symptoms.  His diagonal was severely diseased  essentially occluded.  It is a small diffusely diseased vessel.  His  vein graft continues to demonstrate degenerative changes, and there was  progression with some in-stent restenosis at the top which was  successfully treated with a long CYPHER stent.  He tolerated this well.  His renal function is held up.   MEDICATIONS:  1. Fish oil daily.  2. Actos 30 mg daily.  3. Digoxin 0.125 mg daily.  4. Benazepril 20 mg daily.  5. Tikosyn 500 mg b.i.d.  6. Glimepiride 4 mg daily.  7. Vitamin D daily.  8. Lasix 40 mg b.i.d.  9. Klor-Con 20 mEq one-and-half tablets daily.  10.Metoprolol 50 mg b.i.d.  11.Aspirin 325 mg daily.  12.Plavix 75 mg daily.   PHYSICAL EXAMINATION:  GENERAL:  He is alert and oriented in no  distress.  VITAL SIGNS:  Blood pressure is 118/62.  The pulse is 59.  LUNG:  Fields are clear.  CARDIAC:  Rhythm is regular.  There is an S4 gallop.  The pacer site  looks good.   EKG reveals normal sinus rhythm.  There is either atrioventricular  pacing or atrial tracking and ventricular pacing and in either stents,  the rhythm is regular at 60.   He appears to be substantially and symptomatically improved.  We plan to  have him return to clinic in followup in approximately 1 month and a  basic metabolic profile and CBC will be obtained today.  On a long range  plan, the patient is not a good candidate for a third redo.  He has  increased risks and our goal has  been to try to keep this RCA graft  alive currently.  We discussed the dietary issues.  His sister Junious Dresser  obviously has some quite a bit of concerns about his overall self-care.    Arturo Morton. [name], MD, [facility]  Electronically Signed   TDS/MedQ  DD: 01/07/2008  DT: 01/07/2008  Job #: [id number]

## 2010-07-19 NOTE — Discharge Summary (Signed)
NAME:  Corey [name], Corey [name]                 ACCOUNT NO.:  [id number]   MEDICAL RECORD NO.:  [id number]          PATIENT TYPE:  INP   LOCATION:  3707                         FACILITY:  [facility]   PHYSICIAN:  Noralyn Pick. Eden Emms, MD, FACCDATE OF BIRTH:  29-Oct-1935   DATE OF ADMISSION:  07/22/2008  DATE OF DISCHARGE:  07/24/2008                               DISCHARGE SUMMARY   PRIMARY CARDIOLOGIST:  Maisie Fus D. [name], MD, [facility].   PRIMARY CARE Jarion Hawthorne:  Dr. Mila Merry.   DISCHARGE DIAGNOSIS:  Chest pain.   SECONDARY DIAGNOSES:  1. Coronary artery disease status post coronary artery bypass graft.  2. Ischemic cardiomyopathy.  3. Status post implantable cardioverter-defibrillator.  4. Hyperlipidemia.  5. Diabetes mellitus.  6. Chronic kidney disease.  7. Atrial fibrillation.  8. Status post mitral valve repair.  9. History of cervical disk disease status post facetectomy.  10.Status post inguinal hernia repair.   ALLERGIES:  CODEINE, ETOMIDATE, OXYCODONE/APAP, STATINS.   PROCEDURES:  Left heart cardiac catheterization revealing significant  native multivessel coronary artery disease with widely patent sequential  vein graft to the first and second obtuse marginal, patent vein graft to  the right PDA with patent stent proximally and mild-to-moderate  restenosis in the mid body of the graft.  The patent LIMA to the LAD.  Intravascular ultrasound performed in the body of the vein graft to the  PDA revealed no high-grade stenosis.  Left ventricle filling pressures  were normal.   HISTORY OF PRESENT ILLNESS:  A 75 year old Caucasian male with the above  complex problem list.  Recently underwent cardiac catheterization on  April 22 which is suggesting high-grade in-stent re-stenosis in the vein  graft to the PDA.  He was initially recommended he undergo percutaneous  intervention; however, the patient wished to defer.  In the interim, he  has had recurrent chest and throat discomfort, requiring  nitroglycerin.  He was seen in the office on May 19 and decision was made to admit him  for further evaluation of his graft disease.   HOSPITAL COURSE:  Arrangements were made for cardiac catheterization.  The patient had no recurrent chest pain while hospitalized.  Catheterization was performed on May 20 revealing significant native  multivessel disease with 4 of 4 patent grafts.  There was moderate  stenosis in the vein graft to the PDA; however, intravascular ultrasound  failed to show any significantly flow limiting stenosis.  Thus,  continued medical therapy was recommended.  Mr. Hollar will be  discharged home today in good condition.   DISCHARGE LABS:  Hemoglobin 15.5, hematocrit 45.0, WBC 5.7, platelets  138, INR 1.1, sodium 136, potassium 4.0, chloride 101, CO2 28, BUN 25,  creatinine 1.24, glucose 114, total bilirubin 0.8, alkaline phosphatase  38, AST 24, ALT 17, total protein 7.1, albumin 3.8, calcium 8.9.   DISPOSITION:  The patient will be discharged home today in good  condition.   FOLLOWUP PLANS AND APPOINTMENTS:  We have arranged for followup with Dr.  Rosalyn Charters, P.A. Wende Bushy on June 23 at 12:15 p.m.  He is asked to  follow up with  Dr. Chandra Batch as previously scheduled.   DISCHARGE MEDICATIONS:  1. Aspirin 325 mg daily.  2. Plavix 75 mg daily.  3. Imdur 30 mg half a tablet daily.  4. Fish oil daily.  5. Glimepiride 4 mg daily.  6. Actos 30 mg daily.  7. Digoxin 0.25 mg daily.  8. Klor-Con 20 mEq one and half tablets daily.  9. Tikosyn 500 mcg b.i.d.  10.Metoprolol 50 mg half a tablet b.i.d.  11.Furosemide 20 mg b.i.d.  12.Benazepril 20 mg daily.   OUTSTANDING LABORATORY STUDIES:  None.   DURATION OF DISCHARGE/ENCOUNTER:  Thirty-three minutes including  physician time.      Nicolasa Ducking, ANP      Noralyn Pick. Eden Emms, MD, [facility]  Electronically Signed    CB/MEDQ  D:  07/24/2008  T:  07/25/2008  Job:  [id number]   cc:   Mila Merry

## 2010-07-19 NOTE — Cardiovascular Report (Signed)
NAMEKJUAN, SEIPP                 ACCOUNT NO.:  [id number]   MEDICAL RECORD NO.:  [id number]          PATIENT TYPE:  INP   LOCATION:  3707                         FACILITY:  [facility]   PHYSICIAN:  Arturo Morton. [name], MD, FACCDATE OF BIRTH:  [birth year]   DATE OF PROCEDURE:  01/02/2007  DATE OF DISCHARGE:                            CARDIAC CATHETERIZATION   INDICATIONS:  Mr. Romig is a 75 year old gentleman well-known to me for  many years.  He underwent redo surgery 2006 consisting of a mitral valve  repair, and also had a saphenous vein graft to the intermediate OM,  saphenous vein graft to the diagonal, saphenous vein graft to the  posterolateral branch.  He has developed recurrent throat tightness, and  this is suggestive of his recurrent angina.  The current study was done  to assess his anatomy.   PROCEDURE:  1. Left heart catheterization  2. Selective coronary arteriography.  3. Selective left internal mammary angiography.  4. Saphenous vein graft angiography.  5. The LV angiography was not performed to reduce contrast load.   DESCRIPTION OF PROCEDURE:  The patient was brought to the cath lab and  prepped and draped in usual fashion.  The 4-French catheters were  utilized.  Views of the saphenous vein grafts internal mammary were  obtained.  The native coronary arteries were also injected.  Central  aortic and left ventricular pressures were measured with pigtail  catheter, although we limited contrast by not performing LV angiography.  The patient tolerated the procedure well and was taken to the holding  area in satisfactory clinical condition.   HEMODYNAMIC DATA.:  1. Central aortic pressure 118/65.  2. Left ventricular pressure 124/15.  3. No gradient or pullback across the aortic valve.   ANGIOGRAPHIC DATA.:  1. On plain fluoroscopy, there is evidence of the mitral ring.  The      patient also has a bi-V defibrillator.  2. The left main and leading into the LAD is  diffusely calcified,      stented and segmentally and diffusely diseased.  There is about a      70% area of focal narrowing in the location of the stent.  This      appears to go into a diagonal territory and I do not see the LAD      directly after the midvessel.  3. The left internal mammary to the LAD is widely patent.  4. The vein graft that apparently goes to the diagonal is occluded.  5. The circumflex provides a small bifurcating marginal branch with      some luminal irregularities and then the AV circumflex is occluded.  6. The new saphenous vein graft that goes to a small intermediate and      the obtuse marginal branch is widely patent.  7. The right coronary artery native vessel was totally occluded.  8. The new vein graft to the posterolateral branch was not visualized.      It was hooded into the previous RCA graft.  The old RCA graft is      open and there  is a 90% focal stenosis before the stent site, and      an 80% focal stenosis after this.  More distally, there is diffuse      disease.  The PDA is stented.  It just goes into the posterolateral      branch which then fills the RCA retrograde.   CONCLUSION:  1. Patent internal mammary to the LAD.  2. Patent saphenous vein graft to the intermediate and OM.  3. Continued patency of the old saphenous vein graft to the PD/PLA      with occlusion of the newest vein graft to the PLA system with      progressive disease.  4. Occlusion of the saphenous vein graft to the diagonal with      progressive disease in the native diagonal.   DISPOSITION:  The patient is clearly symptomatic.  He has his classic  symptoms which date back to 1986 when we first met.  The major complaint  is that of throat tightness.  This is likely due to progressive disease  in the old saphenous vein graft.  He will need percutaneous  intervention, this will need that distal protection long-term patency of  this is in question, the patient has  already had two bypass operations  and is not a candidate.      Arturo Morton. [name], MD, [facility]  Electronically Signed     TDS/MEDQ  D:  01/02/2007  T:  01/02/2007  Job:  [id number]   cc:   CV Laboratory

## 2010-07-19 NOTE — Assessment & Plan Note (Signed)
[facility]                            CARDIOLOGY OFFICE NOTE   Corey [name], Corey [name]                        MRN:          [id number]  DATE:01/10/2007                            DOB:          1935/10/15    The patient is in for follow-up.  He actually is quite stable.  He is  doing well.  He underwent percutaneous stenting of the saphenous vein  graft to the right coronary artery to a diffusely diseased graft, but he  has had two prior bypass operations and this represents the old graft.  He had two drug-eluting stents placed.  He has been on aspirin and  Plavix ever since.  He also had dilatation of the LAD leading into the  diagonal which had a tight lesion.  He clinically is much better and is  not having burning in the throat.  His normal dose of Coumadin is 7.5 mg  five days a week and then 3.5 mg on the other days of the week, and this  has resulted in a therapeutic Coumadin level.  Currently, however, he is  not on Coumadin.  We have held this over the weekend until he is seen  back in follow-up.   Now the blood pressure is 137/73, pulse 63, lungs fields clear, cardiac  rhythm is regular.  Extremities reveal no edema.   EKG reveals normal sinus rhythm with atrial tracking and ventricular  pacing in Bi-V mode.   Overall, he is clinically improved.  We are going to go ahead and start  him on 3.75 mg of Coumadin daily.  I will see him back in follow-up in a  week after this commences which is going to be on Monday of next week.  When I see him back we will measure a pro-time and slowly inch his INR  back to 2.  We will try to maintain this triple drug therapy for  approximately six months minimum and then hopefully I will probably  continue him on Warfarin and aspirin after that point.  Should he have  any problems he is to contact me and he understands.  He wondered  whether he could stay off of the Coumadin and I told him I thought that  based  on prior history it would be important for [place] to continue him  since he has had recurrent atrial fibrillation.     Arturo Morton. [name], MD, [facility]  Electronically Signed    TDS/MedQ  DD: 01/10/2007  DT: 01/10/2007  Job #: [id number]

## 2010-07-19 NOTE — Discharge Summary (Signed)
NAME:  Corey [name], Corey [name]                 ACCOUNT NO.:  [id number]   MEDICAL RECORD NO.:  [id number]          PATIENT TYPE:  INP   LOCATION:  3707                         FACILITY:  [facility]   PHYSICIAN:  Noralyn Pick. Eden Emms, MD, FACCDATE OF BIRTH:  [birth year]   DATE OF ADMISSION:  07/22/2008  DATE OF DISCHARGE:  07/24/2008                               DISCHARGE SUMMARY   ADDENDUM   This is an addendum to previously dictated discharge summary job  [phone], please refer to that dictation for complete details.   Please add Darvocet-N 100 one tablet p.o. q.6 h. p.r.n. for neck pain to  the patient's discharge medications.  He has been provided with a  prescription for 30 tablets and 0 refill.  He has been advised to follow  up with Dr. Sherrie Mustache for additional pain medication.      Nicolasa Ducking, ANP      Noralyn Pick. Eden Emms, MD, [facility]  Electronically Signed    CB/MEDQ  D:  07/24/2008  T:  07/25/2008  Job:  [id number]

## 2010-07-19 NOTE — Assessment & Plan Note (Signed)
[facility]                            CARDIOLOGY OFFICE NOTE   MENDELL, BONTEMPO                        MRN:          [id number]  DATE:04/22/2007                            DOB:          [birth year]    Mr. Carneal is in for follow-up.  From a cardiac standpoint, he seems to  be getting along well.  He denies any ongoing chest pain.  However, his  big problem has been continued neck pain.  He has been followed in the  Pain [facility].  He has also received steroids from his primary care  doctor, Dr. Sherrie Mustache in [place].  With this, he has not had much in the  way of relief.  He is quite concerned about this.  Fortunately he has  had no more nose bleeds.  I talked to him about this at some length  today, and encouraged him to follow-up with a neurosurgeon at [facility], who he has seen in the past.   MEDICATIONS:  Include metoprolol tartrate 50 mg 1/2 tablet p.o. b.i.d.  Plavix 75 mg daily, Coumadin, vitamin D, glimepiride 2 mg daily, Actos  30 mg daily, digoxin 0.25 mg daily, furosemide 40 mg b.i.d., benazepril  20 mg daily, Klor-Con 20 mEq daily, and Tikosyn 500 mg b.i.d.   PHYSICAL:  He is alert and oriented.  Blood pressure is 134/80, pulse is 60.  There is a fair amount of firmness over the area of his neck, and this  has been a chronic finding with known history of some cervical spine  disease.  His lung fields are clear.  His overall cardiac rhythm is regular.  His weight is down about 2 pounds from his last visit.   Recent laboratory studies included a hemoglobin of 15.3, hematocrit of  43.5, and a platelet count of 161,000.  White count was 4400 and the INR  was 2.7, potassium was 3.9, BUN 18, creatinine 1.1 and glucose 153.   IMPRESSION:  1. Ischemic cardiomyopathy status post implantation of permanent      defibrillator and also recurrent percutaneous stenting with prior      CABG.  2. Neck pain, likely due to cervical spine  disease has been present      and previously documented at great length.   PLAN:  1. I have asked him to make contact and he will go down today to the      office next door to try to schedule appointment with [facility].  2. He will return to clinic in 4 to 6 weeks in follow-up.  3. He will continue his current medical regimen.  At the present time      an INR was done in the office today, and was 3.7, so he will go      back to his previous regimen which includes taking only half a      tablet or 3.75 mg on Sunday and Wednesday.  He is going to try to      confirm with his primary care doctor to have  his follow-up [facility]      done, as he has in the past.   ADDENDUM:  EKG reveals atrial tracking and biventricular pacing.     Arturo Morton. [name], MD, [facility]  Electronically Signed    TDS/MedQ  DD: 04/22/2007  DT: 04/23/2007  Job #: [id number]

## 2010-07-19 NOTE — Discharge Summary (Signed)
NAMESHAMMOND, ARAVE                 ACCOUNT NO.:  [id number]   MEDICAL RECORD NO.:  [id number]          PATIENT TYPE:  INP   LOCATION:  6522                         FACILITY:  [facility]   PHYSICIAN:  Dorian Pod, ACNP  DATE OF BIRTH:  Oct 18, 1935   DATE OF ADMISSION:  01/02/2007  DATE OF DISCHARGE:  01/05/2007                               DISCHARGE SUMMARY   PRIMARY CARDIOLOGIST:  Arturo Morton. [name], MD, [facility].   DISCHARGE DIAGNOSIS:  Coronary artery disease status post cardiac  catheterization/intervention this admission.   PROCEDURES PERFORMED:  The patient had interventions as follows.  1. LAD/diagonal Maverick decreased from 90 to 50%.  2. SVG to the RCA spider protection under this could Cipher to the mid      posterior and Cipher to the proximal, decreasing from 90 to 0%.   PAST MEDICAL HISTORY:  The past medical history includes:  1. Congestive heart failure secondary to ischemic cardiomyopathy,      status post ICD implantation with upgrade to a biventricular      device.  2. History of ventricular tachycardia.  3. History of atrial fib and atypical atrial flutter status post      cardioversion on __________  therapy.  4. Carotid stenosis with 40-60% left carotid disease.  5. History of coronary artery disease status post MI in 67. Coronary      artery bypass graft in 1995.  Redo CABG in 2006.  6. Status post mitral valve annuloplasty in 2006.  7. Diabetes.  8. Chronic anticoagulation therapy.  9. Hypertension.   HOSPITAL COURSE:  Mr. Mccollam is a very pleasant 75 year old Caucasian  gentleman who was seen by Dr. [name] for routine follow-up visit on  October 20. The patient developed a recurrent throat discomfort similar  to the discomfort he had prior to his original interventions.  Dr.  [name] felt this was reminiscent of previous ischemic symptoms.  The  patient denied any episodes of chest discomfort.  His EKG showed a  biventricular pacing. Dr. [name] felt the  patient needed further  evaluation.  Cardiac catheterization was discussed, and the patient was  agreeable.   The patient presented to [facility] on January 02, 2007 for cardiac  catheterization.  The patient was noted to have occluded SVG to the PLA,  occluded SVG to the diagonal, patent internal mammary artery, patent SVG  to the OM. The patient was scheduled for further intervention on  10/31;2008.  Intervention as stated above.  The patient tolerated  procedure without complications.  Dr. Dietrich Pates was in to see the patient  on day of discharge. The patient was afebrile. Blood pressure 124/61,  sat 93% on room air.   LAB WORK:  His BUN and creatinine were 1 and 1.07, potassium 4, H and H  15.2 of 44.1.   DISPOSITION:  The patient is being discharged home.   FOLLOW UP:  The patient is to follow up with Dr. [name] on January 10, 2007 at 9:15.  He has been given the post cardiac catheterization  discharge instructions.   DISCHARGE MEDICATIONS:  Medications at  time of discharge include aspirin  81 mg daily, Plavix 75.  Digoxin 0.25, KCl 20 mEq 1/2 tablet daily,  metoprolol 25 mg twice a day, benazepril 20 mg daily, Lasix 40 mg  b.i.d., __________  500 mcg b.i.d., Actos 30 mg daily, Amaryl 2 mg,  vitamin B and fish oil as previously, nitroglycerin as needed.  Coumadin  has been discontinued at this time.  Duration of discharge greater than  30 minutes.      Dorian Pod, ACNP     MB/MEDQ  D:  01/05/2007  T:  01/06/2007  Job:  [id number]

## 2010-07-19 NOTE — Assessment & Plan Note (Signed)
[facility]ALTHCARE                            CARDIOLOGY OFFICE NOTE   Saquan, Furtick WALTHER SANAGUSTIN                        MRN:          [id number]  DATE:02/17/2008                            DOB:          1935/11/07    Mr. Menken is in for a followup visit.  Over the past 2 weeks, he has  had a lot of throat discomfort similar to what he has had previously.  This is accompanied by some difficulty with burping and belching.  In  addition, he does feel some discomfort in the roof of his mouth.  Historically, this has been his angina equivalent.  He just underwent  stenting of the RCA bypass graft in October 2009 by Dr. Excell Seltzer.  At that  time, a drug-eluting stent was placed.  The patient's symptoms have been  worrisome, although they have actually gotten better over the past week  or so.   On physical examination today, the blood pressure is 114/62 and the  pulse is 75.  The lung fields are clear.  The cardiac exam is  unremarkable.   His EKG reveals atrial tracking followed by ventricular pacing or  atrioventricular pacing with sinus mechanism.   Mr. Taha is stable, but I am worried about his symptoms.  We talked  about the possibility of bringing him and restudying him, but he  actually is better than he was.  As a result, we elected to bring him  back again in 2 weeks to reassess his symptoms.  If they would progress,  he was to go to the emergency room.  However, he has a known patency of  the internal mammary, and he is not a real good candidate for a third  redo.  We have been trying to keep his RCA graft patent, it is possible  that symptoms could be related to this and if in fact he develops  increasing symptoms, that he should promptly get back in to be seen.  Otherwise, I will see him back in followup in about 2 weeks.     Arturo Morton. [name], MD, [facility]  Electronically Signed    TDS/MedQ  DD: 02/17/2008  DT: 02/18/2008  Job #: [id number]

## 2010-07-19 NOTE — Letter (Signed)
May 20, 2007    Hewitt Shorts, M.D.  [address]  [address]  [place], [place] [address]   RE:  STORY, [name]  MRN:  [id number]  /  DOB:  02/12/1936   Dear [name],   I had the pleasure seeing Corey [name] in the office today in followup,  and I appreciate you calling me about his situation. As you know, he has  progressive neck problems.  He has been in quite some pain, and the pain  clinic has not been able to help him very much.  From a cardiac  standpoint. he has been stable.  Unfortunately, he is on both Coumadin  anticoagulation as well as Plavix in addition and has a drug-eluting  stent in his vein graft.  Cardiac-wise, he has not had chest pain or  progressive shortness of breath, and has improved substantially since we  placed the stents in his vein graft, and treated his diagonal.   PHYSICAL EXAMINATION:  VITAL SIGNS:  Today on examination, blood  pressure is 122/70, the pulse is 72.  LUNG FIELDS:  Clear.  CARDIAC:  Rhythm is regular.  EXTREMITIES:  There is not extremity edema.   Today, we were going to get a basic metabolic profile.  I have told him  that this is a complicated situation.  He would be at risk if he came  off both Coumadin as well as Plavix.  One option might be to stop his  Coumadin, start aspirin several days later, and then withdraw the Plavix  and do the procedures while on aspirin.  While I know this is not  palatable to neuroradiology, and certainly not to neurosurgery, the  risks in both directions are clearly real.  I would be worried about him  being off all antiplatelet therapy.  I worry less about the Coumadin.  He is supposed to  see you on July 02, 2007, then I would be glad to re-discuss his  situation, but those are my current feelings.   I appreciate the opportunity of sharing in his care.  We will continue  to have a dialogue.  .    Sincerely,      Arturo Morton. [name], MD, [facility]  Electronically Signed    TDS/MedQ  DD:  05/20/2007  DT: 05/20/2007  Job #: [id number]

## 2010-07-19 NOTE — Progress Notes (Signed)
[facility] OFFICE NOTE   NAME:Loscalzo, MILO SCHREIER                        MRN:          [id number]  DATE:08/17/2008                            DOB:          05/12/1935    Mr. Clinkscale returns today for followup.  He is a very pleasant elderly  male with ischemic cardiomyopathy, congestive heart failure, and VT.  He  has a history of ICD implantation, status post BiV ICD insertion most  recently 3 years ago.  The patient returns today for followup.  He was  recently hospitalized with chest pain and shortness of breath.  I do not  have those records.  It sounds like not much was discovered.  The  patient's main complaint today is that of fatigue and weakness and  shortness of breath.  He notes that when he does any walking, he is  quickly tired and short of breath and has to stop and rest.  He has very  mild peripheral edema.  He denies syncope or any intercurrent ICD  therapies.  He has very rare palpitations.   His past medical history of course is notable for coronary disease and  diabetes and dyslipidemia.  He is maintained in sinus rhythm fairly  nicely on Tikosyn which we have had him on for his AFib.   His current medications include:  1. Tikosyn 500 mcg twice daily.  2. Potassium 20 mEq 1-1/2 tablet daily.  3. Metoprolol 50 mg daily.  4. Benazepril 20 a day.  5. Furosemide 40 twice a day.  6. Digoxin 0.25 daily.  7. Actos 30 a day.  8. Glyburide 4 mg daily.  9. Isosorbide 30 mg half tablet daily.  10.Plavix 75 a day.  11.Aspirin 325 a day.   PHYSICAL EXAMINATION:  GENERAL:  He is a pleasant man in no acute  distress.  VITAL SIGNS:  The blood pressure was 135/71, the pulse was 60 and  regular, the respirations were 18, the weight was 206 pounds.  NECK:  A  7 cm jugular venous distention.  There is no thyromegaly.  Trachea is  midline.  The carotids are 2+ symmetric.  LUNGS:  Clear bilaterally to  auscultation except for minimal rales in  the bases.  CARDIOVASCULAR:  Regular rate and rhythm with normal S1 and S2.  There  are no murmurs.  The PMI is enlarged and laterally displaced.  ABDOMEN:  Soft, nontender.  EXTREMITIES:  Demonstrated trace peripheral edema bilaterally.   Interrogation of the defibrillator demonstrates a contact of 3 device.  There are no intercurrent IC therapies.  His P-waves were 4.  His R-  waves were 24.  The impedance 629 in the A, 831 in the RV, 819 in the  LV.  Threshold is 1.6 of 0.4 in the A, less than 0.2 and 0.4 in the RV,  and 2.401 in the LV.   IMPRESSION:  1. Ischemic cardiomyopathy.  2. Congestive heart failure at least class III.  3. Atrial fibrillation, maintaining on sinus rhythm very nicely on      Tikosyn therapy.  4. Ventricular tachycardia.   DISCUSSION:  I discussed treatment options with Mr. Filler.  From heart  failure perspective, he is on 40 twice a day of Lasix and I have asked  that he increase his dose to 40 mg 2 tablets in the morning and 40 mg in  the early in the afternoon.  He will do that for 3 days and see if his  dyspnea improves.  He is scheduled to see my partner, Dr. [name] back  in the office next week.  I will see him back after that as needed for  any year for ICD followup of if he experiences any intercurrent ICD  therapies.     [name]. [name], MD  Electronically Signed    GWT/MedQ  DD: 08/17/2008  DT: 08/18/2008  Job #: [id number]   cc:   Mila Merry

## 2010-07-19 NOTE — Assessment & Plan Note (Signed)
[facility] HEALTHCARE                            CARDIOLOGY OFFICE NOTE   Corey [name], Corey [name]                        MRN:          [id number]  DATE:02/12/2007                            DOB:          Jan 07, 1936    Corey [name] is in for a follow-up visit.  He continues to get along well.  He has not had any of the type of symptoms that led him to the  hospitalization in our subsequent treatment of his coronary artery.  He  is back on Coumadin anticoagulation.  He is also remaining on Plavix and  aspirin at the present time.   PHYSICAL EXAMINATION:  Blood pressure is 132/76.  Pulse is 58.  Lung fields are quite clear.  I do not hear any rales.  Cardiac rhythm is regular with a soft systolic ejection murmur.   At the present time, we will repeat his pro time.  As he gets towards a  therapeutic level, then we will likely readjust his medications.  We  will see him back in followup in about six weeks to continue to monitor  him.     Arturo Morton. [name], MD, [facility]  Electronically Signed    TDS/MedQ  DD: 02/12/2007  DT: 02/12/2007  Job #: [id number]

## 2010-07-19 NOTE — Assessment & Plan Note (Signed)
[facility] HEALTHCARE                            CARDIOLOGY OFFICE NOTE   Corey, [name]                        MRN:          [id number]  DATE:08/05/2007                            DOB:          Mar 08, 1935    Corey [name] is in for follow-up.  We had him come in after a lengthy  discussion on the phone regarding his aspirin and his Plavix and  Coumadin.  The patient had a drug-eluting stent placed in November for a  diseased saphenous vein graft.  In addition, the patient has been on  Coumadin because of a history of atrial fibrillation.  He takes Tikosyn  500 mg p.o. b.i.d.  This is for recurrent atrial fibrillation.  In that  mind, he said that he was clearly getting worse before he came off of  both Plavix and Coumadin prior to his surgery.  He says he feels so much  better and his skin is not involved.  He is really rather reluctant to  resume either one.  We went in great detail today regarding the long-  term aspects of atrial fibrillation, and I recounted to him the results  of the AFFIRM trial.  We also talked about the guidelines for drug-  eluting stents.  He feels that he is going to get much worse if he goes  back on all these medicines.   PHYSICAL EXAMINATION:  VITAL SIGNS:  Today on exam, the blood pressure  is 150/90, pulse 60.  LUNGS:  The lung fields are clear.  CARDIOVASCULAR:  The cardiac rhythm is regular.  His median sternotomy  is well-healed.   Since I last saw him, the patient has had his tooth pulled, and he has  also had neurosurgery.  His neck symptoms or dramatically improved, and  he is feeling back to his old self.  He is rather resistant to the  concepts of going back on Coumadin and/or Plavix.  I went over with him  all the data with regard to drug-eluting stents, antiplatelet therapy,  and warfarin anticoagulation for stroke.  He wondered if he could take a  very low dose of warfarin and I told him that the data does not  necessarily support that in the setting of atrial fibrillation.  We  talked about various strategies.  At the present time, he has agreed and  we will discuss just taking an aspirin a day.  This will have the  antiplatelet effect, and though not as robust his Coumadin  anticoagulation for his atrial fibrillation, it will potentially reduce  the risk somewhat.  He is in normal sinus rhythm, but as we know from  the AFFIRM trial, this is no guarantee of prevention from stroke.  Therefore, he will go on aspirin at the present time after a long  discussion about the various options.  This is clearly what he wants to  do, and he is not at all excited about either Plavix or warfarin  anticoagulation.  If he has a change in his status, he is to contact us  promptly and he does  understand the increased thrombotic risks, both  from the stent and the atrial fibrillation.     Arturo Morton. [name], MD, [facility]  Electronically Signed    TDS/MedQ  DD: 08/05/2007  DT: 08/05/2007  Job #: [id number]   cc:   Hewitt Shorts, M.D.

## 2010-07-19 NOTE — Assessment & Plan Note (Signed)
[facility] HEALTHCARE                            CARDIOLOGY OFFICE [name]   Corey [name], Corey [name]                        MRN:          [id number]  DATE:08/09/2006                            DOB:          May 25, 1935    ELECTROPHYSIOLOGIST:  [name]. [name], M.D.   PRIMARY CARE PHYSICIAN:  Dr. Sherrie Mustache in [place].   PRIMARY CARDIOLOGIST:  Arturo Morton. [name], MD, [facility].   Patient has no known drug allergies.   Corey [name] is a 75 year old male with a history of ischemic  cardiomyopathy.  He had an anterior myocardial infarction in 1987.  He  had his first coronary artery bypass graft surgery in 1995.  He had redo  coronary artery bypass graft surgery by Dr. Tyrone Sage in April, 2006.  At  that time, he had a mitral valve annuloplasty for severe mitral  regurgitation.  He has New York Heart Association chronic systolic class  II-III congestive heart failure, which is definitely impacted when the  patient experiences atrial dysrhythmias.  He has known atrial  fibrillation and right-sided atypical atrial flutter.  He also has a BiV  ICD implanted for inducible monomorphic ventricular tachycardia,  ischemic cardiomyopathy, left bundle branch block.   When the patient is in atrial fibrillation, he has profound  fatigue/dyspnea on exertion.  He had cardioversion, which was done on  May 15, 2006.  This was without success.  The patient was placed on  Coumadin at that time.  He was brought back in early May in 2008.  Echocardiogram at that time, Jul 16, 2006, showed an ejection fraction  of 35-40% with some septal dyssynergy.  He was started on Tikosyn at  that hospitalization and had no spontaneous conversion.  He then had  cardioversion prior to discharge, and this has been successful in  maintaining sinus rhythm since that time.   At the time of this presentation, he feels so much better.  He is able  to return to his activities of daily life.  He is a little  cautious  about his activity, but we find him to be in sinus rhythm.  His class II-  III congestive heart failure symptoms are definitely class II.  He has  no dyspnea on exertion.  He has no palpitations, although the patient  was never really aware of tachy arrhythmias.   Patient mentions that since his discharge, he has not been monitored on  Coumadin, although he has maintained basically 7.5 mg daily.  Today we  obtained an INR, which was 1.5.  Dr. Jimmey Ralph reoriented his Coumadin dose  #1.  She had me prescribe a generic Coumadin that we are working with.  His 7.5 mg daily was boosted to 7.5 mg 1-1/2 tablets both Thursday and  Friday, then to revert to one tablet daily.  He is to represent here on  June 11th for another pro time.  At that time, plans will be made for  the patient to switch to the [facility]mad[facility] clinic, which will be  more convenient for him.   PAST MEDICAL HISTORY:  1. The patient's  past medical history includes a history of anterior      myocardial infarction in 1987, coronary artery bypass graft surgery      in 1995, redo coronary artery bypass graft surgery in April, 2006,      mitral valve angioplasty in April, 2006.  2. History of atrial fib, atypical atrial flutter.  3. History of New York Heart Association class II-III congestive heart      failure.  4. History of DC cardioversion without success on May 15, 2006.  5. DC cardioversion on May 9th, on Tikosyn with conversion, which is      holding to this office visit.  6. Hypertension.  7. Diabetes.  8. 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  9. BiV ICD implanted in October, 2003 for monomorphic ventricular      tachycardia, which was inducible left bundle branch block.  He had      generator change on Aug 02, 2005.   CURRENT MEDICATIONS:  1. Enteric-coated aspirin 81 mg daily.  2. Tikosyn 500 mcg twice daily.  3. Klor-Con 20 mEq 1.5  tablets daily.  4. Metoprolol 50 mg daily.  5. Benazepril 20 mg daily.  6. Furosemide 40 mg twice daily.  7. Digoxin 0.25 mg daily.  8. Actos 45 mg daily.  9. Glimepiride 2 mg daily.  10.Warfarin, as described above.   Patient also asked about erectile dysfunction.  Dr. [name] said the  patient would be a candidate now for the phosphodiesterase-5 inhibitors,  and the patient was asked to follow up with Dr. Sherrie Mustache for maybe  inauguration of either Viagra, Cialis, or Levitra.   His blood pressure today was 138/68, pulse was 62 and regular.  Oxygen  saturation 98% on room air.   He had an echocardiogram on Jul 16, 2006.  Ejection fraction of 35-40%,  some septal dyssynergy.     Maple Mirza, PA  Electronically Signed      Veverly Fells. Excell Seltzer, MD  Electronically Signed   GM/MedQ  DD: 08/09/2006  DT: 08/09/2006  Job #: [id number]

## 2010-07-19 NOTE — Discharge Summary (Signed)
NAME:  Corey [name], Corey [name]                 ACCOUNT NO.:  [id number]   MEDICAL RECORD NO.:  [id number]         PATIENT TYPE:  CINP   LOCATION:                                 FACILITY:   PHYSICIAN:  Arturo Morton. [name], MD, FACCDATE OF BIRTH:  [birth year]   DATE OF ADMISSION:  12/27/2007  DATE OF DISCHARGE:  12/31/2007                               DISCHARGE SUMMARY   PROCEDURES:  1. Cardiac catheterization.  2. Coronary arteriogram.  3. Recatheterization with percutaneous transluminal coronary      angioplasty and drug-eluting stent to the saphenous vein graft to      posterior descending artery.   PRIMARY FINAL DISCHARGE DIAGNOSIS:  Unstable anginal pain.   SECONDARY DIAGNOSES:  1. Status post aortocoronary bypass surgery in 1995 with a redo in      2006.  2. Ischemic cardiomyopathy with an ejection fraction of 35-40% by      echocardiogram in 2006.  3. Status post mitral annuloplasty ring at bypass surgery.  4. Status post hernia repair.  5. C3-C4 diskectomy.  6. Allergy or intolerance to CODEINE and STATINS.  7. Paroxysmal atrial fibrillation.  8. Mild renal insufficiency on admission with BUN of 21, creatinine      1.27, and GFR if 56 giving him chronic kidney disease stage III,      however, at discharge BUN 15, creatinine 1.05, and GFR greater than      60.  9. Diabetes with a hemoglobin A1c of 6.8 on this admission.  10.Dyslipidemia with a total cholesterol of 172, triglycerides 76, HDL      25, LDL 132, on fish oil only.  11.History of carotid stenosis with a 40-60% left internal carotid      artery.  12.History of myocardial infarction in 1987.  13.History of atrial fibrillation, ventricular tachycardia, and      atypical atrial flutter on Tikosyn.  14.Status post Guidant biventricular implantable cardioverter-      defibrillator in May 2007.   TIME OF DISCHARGE:  Thirty five minutes.   HOSPITAL COURSE:  [name] is a 75 year old male with known coronary  artery  disease.  He came to the hospital on the day of admission with  throat discomfort which is his anginal equivalent.  He was admitted for  further evaluation.   His cardiac enzymes were negative for MI.  Cardiac catheterization was  performed on December 28, 2007.  It showed severe native 3-vessel disease  with LIMA to LAD patent and no critical distal disease, SVG to OM-1 and  OM-2 patent and no critical distal disease, SVG to diagonal was known to  be totaled, SVG to PDA had a new 75% lesion in the SVG to PDA which was  in-stent restenosis.  Dr. [name] felt that percutaneous intervention  should be delayed, and he should be kept in hydrated.   On December 30, 2007, he had PTCA and drug-eluting stent of the SVG to  PDA with a filter wire distal protection used.  He was enrolled in the  ADAPT-DES study by [facility] and will be followed by them for  this.   Postprocedure, Mr. Bunyard was having no chest pain or shortness of  breath.  He was seen by cardiac rehab and ambulated with no  difficulties.  His platelets were minimally low at 134.  He had come in  with a platelet count of 150, but he had dropped into the 130s and 120s  after admission and this can be followed as an outpatient.  His Lasix  and potassium were on hold for the catheterization, and they will be  restarted in another 24 hours.  Pending evaluation by Dr. [name].  He  is tentatively considered stable for discharge on December 31, 2007, with  close outpatient followup.   DISCHARGE INSTRUCTIONS:  1. His activity level is to be increased gradually.  2. He is to stick to a low-fat diabetic diet.  3. He is to call our office for problems with the cath site.  4. He is to see Dr. [name] on January 07, 2008, at 9:45.  5. He is to follow up with Dr. Newell Coral and Dr. Mila Merry as      needed.   DISCHARGE MEDICATIONS:  1. Plavix 75 mg daily.  2. Aspirin 325 mg daily.  3. Sublingual nitroglycerin p.r.n.  4. Metoprolol 50 mg  b.i.d.  5. Klor-Con 30 mEq daily, restart in a.m.  6. Lasix 40 mg b.i.d., restart in a.m.  7. Vitamin D daily.  8. Glimepiride 4 mg daily.  9. Tikosyn 500 mg b.i.d.  10.Benazepril 20 mg a day.  11.Digoxin 0.25 mg daily.  12.Actos 30 mg a day.  13.Fish oil 1200 mg daily.  14.Halcion 0.25 mg nightly p.r.n.      Theodore Demark, PA-C      Arturo Morton. [name], MD, [facility]  Electronically Signed    RB/MEDQ  D:  12/31/2007  T:  01/01/2008  Job:  [id number]   cc:   Hewitt Shorts, M.D.  Mila Merry

## 2010-07-19 NOTE — H&P (Signed)
NAMESUMMER, Corey [name]                 ACCOUNT NO.:  [id number]   MEDICAL RECORD NO.:  [id number]          PATIENT TYPE:  INP   LOCATION:  6527                         FACILITY:  [facility]   PHYSICIAN:  Rollene Rotunda, MD, FACCDATE OF BIRTH:  12/18/1935   DATE OF ADMISSION:  12/26/2007  DATE OF DISCHARGE:                              HISTORY & PHYSICAL   Cardiologist, Dr. Bonnee Quin.   REASON FOR PRESENTATION:  Patient with throat pain.   HISTORY OF PRESENT ILLNESS:  The patient is a pleasant 75 year old  gentleman with a longstanding history of coronary artery disease.  He is  well known to Dr. [name].  He has been having throat discomfort for  about 3 months on and off.  This has been his previous presenting  angina.  He said this is exactly similar to the discomfort in the past.  However, over the last 2-3 days it has become severe and almost  constant.  It is in his mid throat.  It radiates to the roof of his  mouth.  It is a stabbing discomfort.  It is severe.  It did ease with  nitroglycerin for about 20 minutes at home.  However, he finally  presented to the emergency room where he was given more nitroglycerin  and baby aspirin.  He says it has eased for the most part now.  He is  not having any associated nausea, vomiting or diaphoresis.  He is not  having any associated shortness of breath, PND or orthopnea.  It is  happening at rest.  He does not do a lot of activity but does not seem  to be a significant correlation.  He came to the emergency room where he  has a paced EKG.  Point of care marker x1 was negative.   PAST MEDICAL HISTORY:  Coronary artery disease (status post bypass in  1995, again with a redo in 2006.  Last catheterization in 2008  demonstrated an LAD that was stented proximally with 70% in-stent  restenosis.  Circumflex in the AV groove was occluded.  The right  coronary artery was occluded.  There was a stent in the PDA.  The LIMA  to the LAD was widely  patent. Saphenous vein graft to diagonal was  occluded.  Saphenous vein graft to ramus intermediate and OM was patent.  Saphenous vein graft to the right coronary artery had 90% and 80%  stenosis.  I do not see the intervention report but apparently he had  two Cypher stents placed to the saphenous vein graft to the right  coronary artery and apparently PCI of the LAD diagonal.  This was done  by Dr. [name]), cardiomyopathy (EF 45%.  I do not see an ejection  fraction recently though this EF was mentioned in 2008 note.  There was  an echo in 2006.)  Mitral regurgitation status post ring annuloplasty at  the time of his redo bypass. Hypertension, atrial fibrillation (status  post multiple DC cardioversions. He is on Tikosyn.  He has refused  Coumadin.), hyperlipidemia, diabetes mellitus since the 1980s,  ventricular tachycardia (status  post ICD/CRT with a Guidant device (he  has had a change out), COPD, nephrolithiasis, left internal carotid  artery stenosis.   PAST SURGICAL HISTORY:  CABG, redo CABG, mitral repair with an  angioplasty ring, inguinal hernia repair, C3-C4 diskectomy.   ALLERGIES:  Intolerance to CODEINE.  He also says he has been intolerant  to STATINS.   MEDICATIONS:  Tikosyn 500 mg twice a day, potassium 30 mEq daily,  metoprolol 50 mg daily, benazepril 20 mg daily, furosemide 40 mg b.i.d.,  digoxin 0.25 mg daily, Actos 30 mg daily, glimepiride 4 mg daily,  vitamin D.   SOCIAL HISTORY:  The patient lives alone, but his son checks on him.  He  stopped smoking many years ago.  He does not drink alcohol.   FAMILY HISTORY:  Noncontributory.   REVIEW OF SYSTEMS:  As stated in the HPI and positive for sinus drainage  and constipation.  Negative for all other systems.   PHYSICAL EXAMINATION:  GENERAL APPEARANCE:  The patient is in no acute  distress.  VITAL SIGNS:  Blood pressure 130/67, heart rate 60 and regular,  respiratory rate 18, temperature 97.3.  HEENT:  Eyes  are unremarkable.  Pupils are equal, round, and reactive to  light.  Fundi are not visualized. Oral mucosa remarkable.  NECK: No jugular venous distention at 45 degrees. Carotid upstrokes  brisk and symmetrical.  No bruits, no thyromegaly.  LYMPHATICS:  No cervical, axillary, or inguinal adenopathy.  LUNGS:  Clear to auscultation bilaterally.  BACK:  No costovertebral angle tenderness.  CHEST:  Well-healed sternotomy scar and ICD pocket.  HEART:  PMI not displaced or sustained. S1 and S2 within normal limits.  No S3, no S4. No clicks, rubs, murmurs.  ABDOMEN:  Flat, positive bowel sounds normal in frequency and pitch.  No  bruits, rebound or guarding. No midline pulsatile mass.  No  hepatomegaly, no splenomegaly.  SKIN:  No rashes, no nodules.  EXTREMITIES:  With 2+ pulses throughout, no edema, cyanosis or clubbing.  NEURO:  Oriented to person, place, and time. Cranial nerves II-XII are  grossly intact. Motor grossly intact.   EKG:  AV paced rhythm.   CHEST X-RAY:  No acute disease.   LABS:  Sodium 141, potassium 4.3, BUN 29, creatinine 1.4, WBC 5.2,  hemoglobin 14.9, platelets 150.   ASSESSMENT/PLAN:  1. Unstable angina.  The patient has symptoms consistent with previous      unstable angina. He called the office today and was told to come to      the emergency room for cath by Dr. [name].  This is a reasonable      plan.  I will put him on heparin.  He will be put on IV      nitroglycerin.  He will get his aspirin.  I will use beta blocker.      He will be scheduled for catheterization in the a.m.  2. Renal insufficiency.  This is very mild.  I will hold his Lasix      just tonight and tomorrow and hydrate him gently.  3. Cardiomyopathy. Will watch his fluids very carefully.  Will      continue his other medications, holding his Lasix as above.  4. Diabetes.  Will continue with his p.o. medications while he is      eating and hold them while he is n.p.o.  He will get sliding  scale      insulin.  5. Dyslipidemia.  He has been  intolerant of statins.  I will defer to      Dr. [name].  6. Atrial fibrillation. He will maintain his Tikosyn.  He has refused      Coumadin.  7. Ventricular tachycardia status post implantable cardioverter      defibrillator.  He is up-to-date with follow-up.      Rollene Rotunda, MD, [facility]  Electronically Signed    JH/MEDQ  D:  12/27/2007  T:  12/27/2007  Job:  [id number]

## 2010-07-19 NOTE — Assessment & Plan Note (Signed)
[facility]                            CARDIOLOGY OFFICE NOTE   Tong, Pieczynski ISON WICHMANN                        MRN:          [id number]  DATE:03/29/2007                            DOB:          1935-10-11    Mr. Fife is in for follow-up.  He is stable.  He did have nosebleeds  for several days.   The blood pressure today is 120/80, pulse 76.  The lung fields are clear.  The cardiac rhythm is regular.  He is not have significant nasal blood.   We will have a return in 2 weeks for review and get a CBC and BMET today  as well as a Pro Time.  Adjustments will be made.  In the past, his Pro  Time has actually been fairly low but we have altered this recently.     Arturo Morton. [name], MD, [facility]  Electronically Signed    TDS/MedQ  DD: 04/22/2007  DT: 04/23/2007  Job #: [id number]

## 2010-07-22 NOTE — Op Note (Signed)
NAMEVEDANTH, SIRICO NO.:  [id number]   MEDICAL RECORD NO.:  [id number]          PATIENT TYPE:  INP   LOCATION:  2302                         FACILITY:  [facility]   PHYSICIAN:  Sheliah Plane, MD    DATE OF BIRTH:  Jul 08, 1935   DATE OF PROCEDURE:  06/13/2004  DATE OF DISCHARGE:                                 OPERATIVE REPORT   PREOPERATIVE DIAGNOSIS:  Recurrent coronary occlusive disease, left  ventricular dysfunction, and moderate to severe mitral regurgitation.   POSTOPERATIVE DIAGNOSIS:  Recurrent coronary occlusive disease, left  ventricular dysfunction, and moderate to severe mitral regurgitation.   SURGICAL PROCEDURE:  Re-do sternotomy, coronary artery bypass grafting x 4  with reverse saphenous vein graft to the diagonal, sequential reverse  saphenous vein graft to the intermediate and second obtuse marginal, reverse  saphenous vein graft to the posterolateral branch of the right coronary  artery, and mitral valve repair with ring annuloplasty, Edwards model  #4100, serial [id number], 28 mm ring, with left endovein harvesting.   SURGEON:  Sheliah Plane, M.D.   FIRST ASSISTANT:  Theda Belfast, P.A.   BRIEF HISTORY:  The patient is a 75 year old male who 10 years previously  had undergone coronary artery bypass grafting.  Since that time, he has had  recurrent coronary occlusive disease including thrombosis of a right  coronary graft with stent placement.  He returns now with increasing  symptoms of both congestive heart failure and angina.  Evaluation with a TEE  revealed significant mitral regurgitation and dilated cardiomyopathy.  His  vein graft to the PD, PL was diffusely diseased, and the native vessel was  occluded.  In addition, the vein graft to the second obtuse marginal is  diffusely diseased.  He now has an 80% stenosis and a moderate-sized  intermediate vessel and disease in a small diagonal.  Mammary is patent to  an LAD which is small.   Anterior wall has significant scar.  Because of the  patient's increasing symptoms and critical anatomy including the mitral  regurgitation, re-do coronary artery bypass grafting with mitral valve  repair was recommended to the patient who agreed and signed informed  consent.   DESCRIPTION OF PROCEDURE:  With Swan-Ganz and arterial line monitors in  place, the patient underwent general endotracheal anesthesia without  incident.  Skin of the chest and legs was prepped with Betadine and draped  in the usual sterile manner.  Transesophageal echocardiogram probe was  placed and description is noted in the anesthesiologist note.  Using a  sagittal saw, median sternotomy was performed.  Underlying myocardial tissue  was dissected off the back of the sternum.  The ascending aorta and the  right atrium were dissected out and freed to allow cannulation.  The patient  was systemically heparinized.  Ascending aorta was cannulated.  Superior and  inferior vena caval cannulas were placed directly into the superior and  inferior vena cava.  The patient was then placed on cardiopulmonary bypass  and the remainder of the dissection of the left ventricle was carried out to  identify sites for anastomosis.  Retrograde cardioplegia catheter was  placed.  Ascending aortic cardioplegia needle was also placed.  The patient  was placed on cardiopulmonary bypass at 2.4 L per minute per m sq.  Sites of  anastomosis were dissected out of the epicardium.  Attention was turned  first to the posterior descending and posterior lateral branches.  It had  been an attempt to bypass the posterior descending; however, when this  vessel was dissected out, it became apparent that there were stents placed  throughout the posterior descending down in the vessel approximately two-  thirds of the way.  The distal vessel was too small to bypass.  So, a bypass  was placed to the posterior lateral branch right at the site of the  previous  vein graft anastomosis.  This would still allow some back bleeding through  the old graft into the posterior descending.  Additional cold blood  cardioplegia was administered.  Attention was then turned to the  intermediate coronary artery which was intramyocardial.  This vessel was  opened admitted a 1.5 mm probe.  Using a diamond type, a side-to-side  anastomosis was carried.  Distal extent of the same vein was then carried a  distance to the second obtuse marginal which was opened distal to the old  graft.  Using a running 7-0 Prolene, a distal anastomosis was performed.  Additional cold blood cardioplegia was administered down the vein grafts.  Attention was then turned to the diagonal coronary artery which was a small  vessel but was bypassed with a running 7-0 Prolene and a segment of reversed  saphenous vein graft.  During the procedure, the previously placed left  internal mammary artery had been dissected free and a bulldog was placed on  it.  With the grafts completed, attention was then turned to the left atrium  and an intra-atrial groove of the left atrium was opened and, with proper  retraction, the mitral valve was carefully inspected.  There were no  ruptured chordae because the dilatation was the source of mitral  regurgitation.  The valve was sized for an Edwards model 4100, 28 mm ring.  Serial number was [id number].  Using #2 Tycron sutures placed circumferentially  around the annulus, the ring was then secured in place.  With passive  filling, there was no evidence of mitral regurgitation.  A vent was placed  across the valve.  The atrium was closed with horizontal mattress suture  leaving the suture untied to later de-air the heart.  Attention was then  turned to the ascending aorta.  With the crossclamp still in place, the hood  of the old graft to the OM and old graft to the right were opened, and a third punch aortotomy was performed.  Each of the three vein  grafts were  then anastomosed to the ascending aorta.  Prior to complete closure of the  final graft, the heart was allowed to fill and passively de-air first  through the left atrium, and the vent was removed and atriotomy closed.  Additional de-airing maneuvers were carried out and the bulldog was removed  on the mammary artery with a rise of myocardial septal temperature.  The  aortic crossclamp was then removed with a total crossclamp time of 155  minutes as the final proximal anastomosis was completed.  The patient  spontaneously converted to a paced rhythm from his internal  pacemaker/defibrillator.  His body temperature was rewarmed to 37 degrees  with TEE.  Heart was allowed to fill  and showed good function of his mitral  valve without mitral regurgitation.  On milrinone and dopamine, he was then  ventilated and weaned from cardiopulmonary bypass without difficulty.  He  remained hemodynamically stable.  He was decannulated in the usual fashion.  Protamine sulfate was administered with operative field hemostatic.  Two  atrial and two ventricular pacing wires were applied.  Two mediastinal tubes  were left in place.  The pericardium was loosely reapproximated.  Sternum  was closed with #6 stainless steel wire.  Fascia was closed with interrupted  0 Vicryl, running 3-0 Vicryl in the subcutaneous tissue, 4-0 subcuticular  stitch in the skin edges.  Dry dressings were applied and sponge and needle  count was reported as correct at the completion of the procedure.  The  patient tolerated the procedure without obvious complication and was  transferred to the surgical intensive care unit for further postoperative  care.      EG/MEDQ  D:  06/14/2004  T:  06/14/2004  Job:  [id number]   cc:   Arturo Morton. [name], M.D. [facility]

## 2010-07-22 NOTE — Assessment & Plan Note (Signed)
[facility]                              CARDIOLOGY OFFICE NOTE   NASIF, BOS                        MRN:          [id number]  DATE:11/22/2005                            DOB:          [birth year]    HISTORY OF PRESENT ILLNESS:  Mr. Masoner is in for followup. His major  complaint is burning in the feet. He denies any ongoing chest pain at the  present time. His pacemaker mediated tachycardia has largely resolved. I  believe he was anti-tachycardia pacing between 115 and 170, and that was  adjusted. He has had no further episodes and he feels well.   PHYSICAL EXAMINATION:  VITAL SIGNS:  Blood pressure 110/68, pulse 75.  LUNGS:  Fields are clear.  HEART:  The pacer site is well healed.  EXTREMITIES:  Reveal virtually no edema.   DIAGNOSTIC STUDIES:  EKG reveals atrial tracking, ventricular pacing.   IMPRESSION:  1. Ischemic cardiomyopathy with left ventricular dysfunction.  2. Coronary artery disease, status post coronary artery bypass surgery.  3. Class 3 congestive heart failure, converted to class 2.  4. Paroxysmal ventricular tachycardia, status post implantation of cardiac      defibrillator.  5. Mixed hyperlipidemia.   PLAN:  1. Continue current medical regimen.  2. Basic metabolic profile.  3. Return to clinic in 3 months.                              Arturo Morton. [name], MD, [facility]    TDS/MedQ  DD:  11/22/2005  DT:  11/23/2005  Job #:  [id number]

## 2010-07-22 NOTE — Discharge Summary (Signed)
NAMEALLEX, LAPOINT NO.:  [id number]   MEDICAL RECORD NO.:  [id number]          PATIENT TYPE:  INP   LOCATION:  2899                         FACILITY:  [facility]   PHYSICIAN:  [name]. [name], M.D.  DATE OF BIRTH:  1935-04-08   DATE OF ADMISSION:  08/02/2005  DATE OF DISCHARGE:  08/02/2005                                 DISCHARGE SUMMARY   ALLERGIES:  No known drug allergies.   PRINCIPAL DIAGNOSIS:  1.  Guidant Bi-V ICD at elective replacement indicator.  2.  Status post generator change-defibrillator threshold study.  3.  Implant Guidant Contak Renewal III Bi-V generator by Dr. Lewayne Bunting.  4.  History of Bi-V ICD implanted for inducible monomorphic ventricular      tachycardia.   SECONDARY DIAGNOSES:  1.  Ischemic cardiomyopathy, ejection fraction 35-40%.  2.  History of coronary artery disease.      1.  Status post redo bypass April 2006.      2.  Grafts included vein graft to the obtuse marginal and ramus          intermedius and a vein graft to the third diagonal.  3.  Mitral regurgitation status post mitral valve repair at redo bypass on      April 2006.  4.  Chronic obstructive pulmonary disease.  5.  Hypertension.  6.  Dyslipidemia.  7.  Extracranial cerebrovascular occlusive disease with 40-60% left internal      carotid artery stenosis.   PROCEDURE:  Aug 02, 2005:  Explantation of existing Bi-V generator with  implantation of Guidant Contak Renewal III cardioverter defibrillator  generator.  Patient tolerated the procedure well.  There has been no post  procedural complications.  He has been instructed in postoperative care  which consists of bandage comes off in the morning May 31 to keep the  incision open to the air, keep the incision dry over the next seven days,  sponge bathe until Wednesday, June 6, resume taking his medications and in  addition Keflex 500 mg tablets four times daily.  He is to take them one-  half hour before breakfast,  lunch, dinner, and bedtime for the next five  days.   FOLLOW-UP:  [facility] [address] [facility] Monday,  June 18 at 10.  He sees Dr. [name] Thursday, July 19 at 10.   LABORATORIES:  White cells 5.8, hemoglobin 14.6, hematocrit 43.6, platelets  167.  PTT was 27, PT was 13.5, INR 1.  Serum electrolytes this admission  taken May 25:  Sodium 140, potassium 4.2, chloride 101, carbohydrate 29,  glucose 149, BUN 25, creatinine 1.4.      Maple Mirza, P.A.    ______________________________  [name]. [name], M.D.    GM/MEDQ  D:  08/02/2005  T:  08/02/2005  Job:  [id number]   cc:   Arturo Morton. [name], M.D. [facility]  [address]  [address]  [place]  [place] [address]   Mila Merry  Fax: [phone]

## 2010-07-22 NOTE — Op Note (Signed)
NAME:  Corey [name], FEICK                 ACCOUNT NO.:  [id number]   MEDICAL RECORD NO.:  [id number]          PATIENT TYPE:  INP   LOCATION:  3712                         FACILITY:  [facility]   PHYSICIAN:  Arturo Morton. [name], MD, FACCDATE OF BIRTH:  1935-04-10   DATE OF PROCEDURE:  DATE OF DISCHARGE:                               OPERATIVE REPORT   PROCEDURE:  Cardioversion.   INDICATIONS:  Mr. Brackins is a 74 year old gentleman with ischemic  cardiomyopathy.  He was seen by Dr. [name] and admitted to the hospital  for a Tikosyn load.  He has not converted to normal rhythm.  He has been  chronically anticoagulated with a therapeutic INR.  Dr. [name] arranged  for cardioversion today.  The patient understood the risks and benefits  and has had previous cardioversion noted.   PROCEDURE:  Anesthesia was provided by the anesthesia service under the  direction of Dr. Sharee Holster.  Pentothal was used for anesthesia, and  the patient was adequately anesthetized.  A biphasic defibrillation  using AP paddles with one shock at 200 joules was performed.  Prior to  cardioversion, interrogation of his device documented atrial flutter.  Following, there was documentation of sinus rhythm.  The patient  subsequently re-awoke and was alert with normal cognitive function.  There were no complications noted.  A follow-up EKG was obtained.   CONCLUSION:  Successful cardioversion.   DISPOSITION:  The patient will remain on Tikosyn under the direction of  Dr. [name].      Arturo Morton. [name], MD, [facility]  Electronically Signed     TDS/MEDQ  D:  07/13/2006  T:  07/14/2006  Job:  [id number]

## 2010-07-22 NOTE — Assessment & Plan Note (Signed)
[facility]                            CARDIOLOGY OFFICE NOTE   Corey [name], Corey [name]                        MRN:          [id number]  DATE:06/26/2006                            DOB:          1935/12/18    Corey [name] is in for followup.  He remains somewhat short of breath.  He  unfortunately has gone back into atrial fibrillation.  He has underlying  ventricular pacing.  We are scheduling him to see Dr. [name] back in  followup in about a week.  He underwent cardioversion in March.  He is  continuing to have problems with his neck and he has seen Dr. Delano Metz for this.  Options have been discussed.  The patient remains on  Coumadin anticoagulation and is scheduled to see Dr. [name] back in  followup in the EP lab next week.  Since in the last few weeks he has  noticed he is more short of breath as well, when he lays down after  about an hour or two he will develop some shortness of breath.  He  promises that he is watching his salt.   Currently his weight is 211 pounds, blood pressure 138/78, the pulse is  70.  The lung fields are actually quite clear.  The cardiac rhythm is  regular, but it is underlying paced.  The extremities do not reveal  significant edema.   His underlying rhythm is an atrial arrhythmia.  He is ventricular paced.   We plan to have him see Dr. [name] back in followup in about a week.  They will discuss further options including perhaps cardioversion with  medical therapy.  He does not want the same anesthetic that was  previously used.  We will get a basic metabolic profile today.  I will  see him back in followup in 3 weeks.     Corey Morton. [name], MD, [facility]  Electronically Signed    TDS/MedQ  DD: 06/26/2006  DT: 06/26/2006  Job #: [id number]   cc:   Delano Metz

## 2010-07-22 NOTE — Discharge Summary (Signed)
Corey [name]. [facility]  Patient:    Corey [name], Corey [name]                        MRN: [id number] Adm. Date:  [id number] Disc. Date: 03/28/00 Attending:  Ronaldo Miyamoto Dictator:   Lavella Hammock, P.A. CC:         Dr. Holley Raring, [place]   Discharge Summary  LEAVE OF ABSENCE NOTE  DATE OF BIRTH:  02/25/1936  HOSPITAL COURSE:  [name] is a 75 year old male with a history of bypass surgery who came into [facility] for a catheterization on March 27, 2000.  The cardiac catheterization showed a normal left main, a totaled LAD, a 99% first diagonal before the stent, with 50% in-stent restenosis.  The ramus intermedius had a 30% stenosis, and the circumflex had a 99% stenosis and then was totaled.  The RCA had a 50% midstenosis and a 95% distal stenosis.  The LIMA to LAD was patent.  The SVG to circumflex was patent and the SVG to PL and PDA was patent as well.  There was, however, a distal 80% stenosis in the PL.  Films were reviewed and it was felt that percutaneous intervention was indicated.  The patient was scheduled to go home on leave of absence and come back in Friday for percutaneous intervention.  In the meantime he is to be started on Plavix in addition to his other medications.  PROBLEM LIST:  1. Coronary artery disease, for percutaneous intervention on Friday on     the PDA after the graft insertion.  2. Status post aortocoronary bypass surgery in 1995 with LIMA to LAD,     SVG to circumflex, and SVG to PDA and PL.  3. Left ventricular dysfunction with an EF of 41% by catheterization on     March 27, 2000.  4. History of percutaneous transluminal coronary angioplasty and stent to     the first diagonal in 1999.  5. Hypertension.  6. Congestive heart failure.  7. History of myocardial infarction.  8. Non-insulin-dependent diabetes mellitus.  9. History of hiatal hernia and reflux disease. 10. Hyperlipidemia. 11.  Family history of coronary artery disease. 12. Status post hernia repair, cervical laminectomy. 13. History of nephrolithiasis. 14. History of prostatitis.  LEAVE OF ABSENCE MEDICATIONS: 1. Plavix 75 mg q.d. 2. Nitroglycerin 0.4 mg p.r.n. 3. Toprol XL 25 mg q.d. 4. Lotensin 10 mg q.d. 5. Digoxin 0.125 mg q.d. 6. Norvasc 5 mg q.d. 7. Coated aspirin 325 mg q.d. 8. Lopid 600 mg b.i.d. 9. Lasix 60 mg q.d. DD:  03/28/00 TD:  03/28/00 Job: [id number]

## 2010-07-22 NOTE — Cardiovascular Report (Signed)
NAME:  Corey [name], Corey [name]                           ACCOUNT NO.:  [id number]   MEDICAL RECORD NO.:  [id number]                   PATIENT TYPE:  INP   LOCATION:  3305                                 FACILITY:  [facility]   PHYSICIAN:  Charlies Constable, M.D. [facility]              DATE OF BIRTH:  21-Jul-1935   DATE OF PROCEDURE:  06/05/2003  DATE OF DISCHARGE:                              CARDIAC CATHETERIZATION   CLINICAL HISTORY:  Mr. Christell Constant is 75 years old and had bypass surgery 10 years  ago.  He developed chest pain over the past week while he was at the beach  and came in yesterday and was admitted yesterday with diagnosis of unstable  angina.  He had elevated troponins so he classified as a non-ST-elevation  infarction, but negative CKs.  The diagnostic study was performed  immediately prior to this by Dr. Antoine Poche which demonstrated a 99% stenosis  in the vein graft to the right coronary artery with TIMI-1 flow.  He was  enrolled in the  spider trial and was randomized to distal protection with  the filter wire EZ.   PROCEDURE:  The procedure was performed via the right femoral artery using  arterial sheath and 7 French right bypass graft guiding catheter with side  holes.  We crossed the lesion with the filter wire EZ without too much  difficulty and deployed the filter in the distal portion of the vein graft  to the right coronary artery.  In order to obtain better flow, we predilated  with a 2.5 x 20-mm Maverick balloon performing one inflation up to 8  atmospheres for 30 seconds.  This established TIMI-3 flow.  We then stented  the distal lesion with a 3.5 x 18-mm Cypher stent deploying this with one  inflation of 14 atmospheres for 30 seconds.  We post dilated the distal  stent with a 4.0 x 15-mm Quantum Maverick performing two inflations up to 12  atmospheres for 30 seconds and we post dilated the proximal stent with the  same 4.0 x 15-mm Quantum Maverick performing two inflations up to 12  atmospheres for 30 seconds.  The filter was then retrieved with the  retrieval sheath and the entire system was removed from the vein graft.  Final diagnostic studies were performed through the guiding catheter.  We  obtained a small to moderate amount of material in the filter.  The patient  tolerated the procedure well and left the laboratory in satisfactory  condition.   RESULTS:  Initially, the stenosis was located in the mid to distal portion  part of the vein graft to the posterior descending and posterior lateral  branches of the right coronary artery.  Following stenting of the distal  lesion, stenosis improved from 99% with TIMI-1 flow to 0% with TIMI-3 flow.   Following stenting of the proximal lesion, the stenosis improved from 60% to  0%.  CONCLUSION:  Successful stenting of tandem lesions in the saphenous vein  graft to the posterior descending and posterior lateral branch of the right  coronary artery using distal protection with the filter wire (spider study)  with  improvement in the distal stenosis from 99% to 0% with a Cypher stent and  improvement in the proximal stenosis from 60% to 0% with a Taxus stent.   DISPOSITION:  The patient was returned to the postangioplasty unit for  further observation.                                               Charlies Constable, M.D. [facility]    BB/MEDQ  D:  06/05/2003  T:  06/06/2003  Job:  [id number]   cc:   Arturo Morton. [name], M.D. [facility]   Dr. Talmage Coin

## 2010-07-22 NOTE — H&P (Signed)
NAME:  GREGROY, DOMBKOWSKI                           ACCOUNT NO.:  [id number]   MEDICAL RECORD NO.:  [id number]                   PATIENT TYPE:  INP   LOCATION:  2032                                 FACILITY:  [facility]   PHYSICIAN:  Jonelle Sidle, M.D. [facility]        DATE OF BIRTH:  [birth year]   DATE OF ADMISSION:  11/04/2001  DATE OF DISCHARGE:                                HISTORY & PHYSICAL   PRIMARY CARE PHYSICIAN:  Cecil Cranker, M.D.   CARDIOLOGIST:  Arturo Morton. [name], M.D. [facility]   CHIEF COMPLAINT:  Cough and shortness of breath.   HISTORY OF PRESENT ILLNESS:  The patient is a 75 year old male with a known  ischemic cardiomyopathy and an ejection fraction of 30 to 35%, status post  coronary artery bypass grafting in 1995, with a vein graft to the posterior  descending and posterior lateral branches of the right coronary artery, vein  graft to the second obtuse marginal branch and LIMA graft to the left  anterior descending.  The patient has also had subsequent percutaneous  coronary intervention to the left anterior descending and posterior  descending artery branch in 2002.  The patient was last admitted in March  with symptoms of congestive heart failure and underwent cardiac  catheterization at that time which revealed patent grafts and no significant  restenosis in the previous percutaneous coronary intervention sites. He has  been managed medically since that time.  The patient presents now with a two  week history of progressive paroxysmal nocturnal dyspnea, wheezing, cough  which at times is productive of a discolored sputum.  He also gets quite  short of breath with activities such as climbing stairs.  He has had no  typical angina.  He denies any specific changes in his medical regimen.  He  does eat out quite frequently and most likely has increased salt intake in  his diet.  His urine output has been normal.  I get the sense in talking  with the patient that he has had  fairly chronic complaints similar to these  and he has, in fact, by his report been evaluated by pulmonary medicine and  gastroenterology.  He tells me that bronchodilators have not been  particularly helpful nor have proton pump inhibitors.   ALLERGIES:  No known drug allergies.   CURRENT MEDICATIONS:  1. Toprol XL 25 mg p.o. q.d.  2. Digoxin 0.125 mg p.o. q.d.  3. Enteric coated aspirin 325 mg p.o. q.d.  4. Glucophage 500 mg p.o. b.i.d.  5. Plavix 75 mg p.o. b.i.d.  6. Potassium chloride 20 mEq p.o. q.d.  7. Lasix 40 mg p.o. b.i.d.   PAST MEDICAL HISTORY:  1. Coronary artery disease with an ejection fraction of 30 to 35% and 2+     mitral regurgitation, status post coronary artery bypass grafting in 1995     as outlined above.  The patient has undergone subsequent percutaneous  coronary intervention to the left anterior descending and posterior     descending artery branches.  Most recent cardiac catheterization was in     March and is outlined in the history of present illness.  The patient has     been managed medically.  2. Dyslipidemia.  I don't have any fasting lipid profile data for review.     He is not on statin therapy.  3. Type 2 diabetes mellitus.  4. Hypertension.  5. Reported gastroesophageal reflux disease.  6. Status post inguinal hernia repair and cervical laminectomy.  7. Possible chronic lung disease by the patient's description although this     is not clearly defined at this time.   SOCIAL HISTORY:  The patient lives alone and denies significant tobacco or  alcohol use at the present.   FAMILY HISTORY:  Noncontributory.   REVIEW OF SYMPTOMS:  As described in the history of present illness.  The  patient has had no fevers or chills.  He states his appetite has been  normal.  His weight has been stable as best he can tell.   PHYSICAL EXAMINATION:  VITAL SIGNS:  Temperature 98.6 degrees. Heart rate 70  and regular.  Respirations 16.  Blood pressure  140/80.  GENERAL:  This is an obese male in no acute distress.  HEENT:  Conjunctivae normal.  Oropharynx clear.  NECK:  Supple without significantly elevated jugular venous pressure at 45  degrees.  LUNGS:  Exhibit a few crackles at the bases bilaterally with no wheezes or  rhonchi.  Respiratory effort is not labored at rest.  CARDIOVASCULAR:  Examination reveals a regular rate and rhythm without S3  gallop.  ABDOMEN:  Obese without obvious hepatomegaly or bruits.  EXTREMITIES:  Exhibit no cyanosis, clubbing or edema.  Peripheral pulses are  2+.  SKIN:  No ulcerative changes are noted.  MUSCULOSKELETAL:  No kyphosis is noted.  NEUROPSYCHIATRIC:  Patient is alert and oriented X3.  Affect is normal.   LABORATORY DATA:  Hemoglobin 12.9, platelet count 187,000.  Potassium 3.4,  BUN and creatinine 16 and 1.1 respectively.  Total CK 97.  CK-MB 1.8,  troponin-I 0.04.  BNP 309.   Chest x-ray is reported as showing mild edema.   The 12 lead electrocardiogram shows normal sinus rhythm with an  intraventricular conduction delay of left bundle branch type.   ASSESSMENT/PLAN:  1. Known ischemic cardiomyopathy with ejection fraction of 30 to 35%, status     post previous coronary artery bypass grafting in 1995 as well as     subsequent interventions as outlined.  Most recent cardiac     catheterization in March showed no indications for revascularization.     The patient has symptoms suggestive of mild clinical congestive heart     failure and this is supported objectively by a mildly elevated BNP of 309     and evidence of fluid overload.  Dietary indiscretions are probably to     blame for some of this.  The patient has been compliant with his     medications per his report.  Will admit for intravenous diuresis and     medication titration as tolerated.  Given his cough with mildly    productive sputum, will also treat with a short course of azithromycin in     the event that there may be a  concurrent bronchitis.  Most likely he will     be able to be discharged soon with outpatient follow up  closely.  Will     have Dr. [name] evaluate the patient in the morning.  2.     Type 2 diabetes mellitus.  3. Hypertension.  4. Reported dyslipidemia.  Will check a fasting lipid profile.  The patient     is not on statin therapy at present.                                               Jonelle Sidle, M.D. [facility]    SGM/MEDQ  D:  11/04/2001  T:  11/05/2001  Job:  [id number]

## 2010-07-22 NOTE — Discharge Summary (Signed)
NAME:  Corey [name], Corey [name]                           ACCOUNT NO.:  [id number]   MEDICAL RECORD NO.:  [id number]                   PATIENT TYPE:  OIB   LOCATION:  3729                                 FACILITY:  [facility]   PHYSICIAN:  [name]. [name], M.D. [facility]           DATE OF BIRTH:  1935/08/22   DATE OF ADMISSION:  12/11/2001  DATE OF DISCHARGE:  12/12/2001                                 DISCHARGE SUMMARY   HISTORY OF PRESENT ILLNESS:  This is a 75 year old gentleman who was seen in  the office by Dr. Lewayne Bunting for evaluation of nonsustained VT, congestive  heart failure in the setting of ischemic cardiomyopathy. He is a pleasant 83-  year-old gentleman with several hospitalizations over the last several  months with worsening dyspnea. He has also been seen by Dr. Sherene Sires and Dr.  Marina Goodell. He has a history of exertional dyspnea as well as nocturnal dyspnea  which is relieved by upright posture. Catheterization several weeks ago  after hospitalization demonstrated increased LV-end diastolic pressure, 28  mmHg. The patient denies any history of syncope or palpitations while he was  hospitalized with heart failure. He was noted to have nonsustained VT. The  patient was admitted for placement of a BiV ICD. The patient had an EP study  prior to procedure which showed inducible monomorphic VT, cyclic of 240  milliseconds. The patient then underwent placement of a guided BiV ICD. DFTs  upon insertion were 14 joules. He tolerated the procedure well.  Postoperative day #1, the patient was noted to have  a temperature of 100.5,  white count on 10/8 was 5.5. Site looks okay. Values within normal limits.  It was also noted that patient was not taking any ACE inhibitor, and Dr.  Graciela Husbands spoke with Dr. [name], and this would be addressed as an outpatient  by Dr. [name] in a couple of weeks. The patient's Toprol dose was increased  to 50 mg daily.   DISCHARGE MEDICATIONS:  He was discharged on the  following medications:  1. Lasix 40 b.i.d.  2. Digoxin 0.125 daily.  3. Coated aspirin 325 daily.  4. Glucophage 500 b.i.d. to start on Saturday, 10/11.  5. K-Dur 20 daily.  6. Plavix 75 daily, also to start on 10/11.  7. Toprol 50 mg daily, new increased dosage.   DISCHARGE INSTRUCTIONS:  His activities and wound care were as per the  pacemaker discharge sheet. Low-fat, low-cholesterol, low-salt diet. The  patient was to followup in the pacemaker clinic 10/22 at 9:45 a.m. He was to  follow with Dr. [name] on 10/22 at 10 a.m. and has follow up with Dr.  [name] within two months. The office will call to schedule that appointment.  The patient was instructed to monitor his temperature and to call if his  temperature was greater than 101.      Chinita Pester, C.R.N.P. [facility]  [name]. [name], M.D. [facility]    DS/MEDQ  D:  12/12/2001  T:  12/16/2001  Job:  [id number]   cc:   Arturo Morton. [name], M.D. [facility]   Talmage Coin, M.D.   [facility]

## 2010-07-22 NOTE — Op Note (Signed)
NAMEALPHONSUS, DOYEL NO.:  [id number]   MEDICAL RECORD NO.:  [id number]          PATIENT TYPE:  INP   LOCATION:  2899                         FACILITY:  [facility]   PHYSICIAN:  [name]. [name], M.D.  DATE OF BIRTH:  1936/01/22   DATE OF PROCEDURE:  08/02/2005  DATE OF DISCHARGE:                                 OPERATIVE REPORT   PROCEDURE PERFORMED:  Removal of previous implanted ICD generator and  insertion of a new ICD generator with defibrillation threshold testing.   INTRODUCTION:  The patient is a 75 year old male with an ischemic  cardiomyopathy status post MI with class III heart failure who underwent bi-  V ICD implantation approximately 3 years ago.  The patient's heart failure  improved markedly.  He has now developed ICD device elective replacement  indication and is referred for generator change and defibrillation testing  of the new device.   PROCEDURE:  After informed consent was obtained, the patient is taken to the  diagnostic EP lab in fasting state.  After usual preparation and draping,  intravenous fentanyl and midazolam was given for sedation.  30 mL of  lidocaine was infiltrated over the old left infraclavicular ICD insertion  site and then a 7 cm incision was carried out over this region.  Electrocautery utilized to dissect down to the fascial plane.  The ICD  pocket was entered with electrocautery and the old Guidant device was  removed with gentle traction.  The LV lead, right atrial lead and  defibrillator leads were all removed from the old ICD generator.  There were  analyzed and this demonstrated the R-waves of 18 P-waves of 1 and LV waves  of 24.  The pacing impedance was 700 in the right atrium, 670 the right  ventricle and 1120 in the left ventricle.  Threshold was 3.3 volts at 0.8  milliseconds in the LV, 0.4 volts at 0.5 milliseconds in the RV and 1.6  volts of 0.5 in the right atrium.  With these acceptable parameters the new  Guidant contact III model H 175 serial number [id number] biventricular ICD was  connected to the right atrial, right ventricular, and left ventricular leads  and placed back in the subcutaneous pocket.  The pocket was then irrigated  with kanamycin.  The incision was closed with layer of 2-0 Vicryl followed  by layer of 3-0 Vicryl.  Defibrillation threshold testing was then carried  out.   After the patient was more deeply sedated with fentanyl and Versed, VF was  induced with a T-wave shock and a 14 joules shock was subsequently delivered  which terminated ventricular fibrillation restored sinus rhythm.  Because of  prior successful defibrillation threshold testing, no additional testing was  carried out and the pressure dressing was placed.  Benzoin and Steri-Strips  were placed on the incision and the patient was returned to his room in  satisfactory condition.   COMPLICATIONS:  There were no immediate procedure complications.   RESULTS:  Demonstrates successful biventricular ICD implantation in patient  with an ischemic cardiomyopathy status post prior bi-V ICD.  ______________________________  [name]. [name], M.D.     GWT/MEDQ  D:  08/02/2005  T:  08/02/2005  Job:  [id number]   cc:   Arturo Morton. [name], M.D. [facility]  [address]  [address]  [place]  [place] [address]   Mila Merry  Fax: [phone]

## 2010-07-22 NOTE — Assessment & Plan Note (Signed)
[facility]                            CARDIOLOGY OFFICE NOTE   Corey [name], Corey [name] PHUONG MOFFATT                        MRN:          [id number]  DATE:03/01/2006                            DOB:          1935-05-23    Mr. Konicek is in for followup.  He really is feeling quite well.  Defibrillator check was done by [facility] today, and unfortunately he  appears to be in atrial fibrillation.  He has had a lot of time spent in  and out of atrial fibrillation.  When he was last seen three months ago,  he was in sinus rhythm.  He feels well.  He does not have any specific  complaints other than his feet are cold.   PHYSICAL EXAMINATION:  VITAL SIGNS:  Blood pressure 130/77, pulse 70.  LUNG FIELDS:  Clear.  CARDIAC:  Rhythm is regular.  EXTREMITIES:  Revealed no edema.   The patient is significantly improved from a cardiac standpoint.  He is,  however, clearly in atrial fibrillation, with underlying ventricular  pacing.  He has been doing a lot of mode switching.  We are going to  have him back in two weeks.  In the interim, I am going to restart him  on Coumadin.  Previously, he was on 5 mg and 7.5 mg, but we will start  him at 2.5 mg.  He is not to start this until after he has a skin biopsy  tomorrow.  He will come off his Plavix in the interim as well.  We will  go ahead and discontinue Plavix at the present time.  He will remain on  his aspirin.  We will need to watch him closely, and since he has been  on Coumadin in the past he is familiar with its use.  I will see him  back in followup in two weeks, and we will reassess his status.     Arturo Morton. [name], MD, [facility]  Electronically Signed    TDS/MedQ  DD: 03/01/2006  DT: 03/01/2006  Job #: [id number]

## 2010-07-22 NOTE — Assessment & Plan Note (Signed)
[facility]                         ELECTROPHYSIOLOGY OFFICE NOTE   [name], Corey                        MRN:          [id number]  DATE:07/05/2006                            DOB:          April 21, 1935    Patient returns today for followup.  He is a very pleasant man with a  history of chronic atrial fibrillation and congestive heart failure, and  a left bundle branch block who is status post BiV-ICD insertion.  The  patient, however, has had problems with atrial fibrillation for the last  several months.  We tried to cardioverted him back to sinus rhythm which  we did, but unfortunately he had early return of atrial fibrillation and  now returns for followup.  His heart failure is as bad as ever.  He  states that after his cardioversion he felt well for several days, but  since then he is felt poorly again.   PHYSICAL EXAMINATION:  On exam, today, he is a pleasant middle-aged man  in no acute distress.  The blood pressure 112/70, the pulse 70 and regular, respirations 18,  and a weight was 213 pounds.  NECK:  Revealed no jugular venous distention.  LUNGS:  Clear bilaterally to auscultation.  No wheezes rails rhonchi.  CARDIOVASCULAR EXAM:  Reveals a regular rate and rhythm with normal S1  and S2.  EXTREMITIES:  Demonstrated a trace peripheral edema bilaterally.   Interrogation of his defibrillator demonstrates a Guidant Contact device  which with defibrillation waves of 1.8.  His are waves were 24.  Pacing  impedance 562 in the interim, 766 and the right ventricle, and 700 and  the left ventricle.  Threshold 0.4 and 0.5 in the right ventricle, and  2.8 and 0.5 in the left ventricle.   IMPRESSION:  1. Ischemic cardiomyopathy.  2. Congestive heart failure.  3. Status post BiV-ICD insertion.  4. Return of atrial fibrillation worsening the patient's heart      failure.   DISCUSSION:  Overall the patient is stable, but his heart failure has  worsened.  We will has had become and in the next week for Tikosyn  initiation.  He will continue his Coumadin.     [name]. [name], MD  Electronically Signed    GWT/MedQ  DD: 07/05/2006  DT: 07/05/2006  Job #: [id number]   cc:   Delano Metz

## 2010-07-22 NOTE — Cardiovascular Report (Signed)
[place]  Patient:    Corey [name], Corey [name]                        MRN: [id number] Proc. Date: 03/30/00 Adm. Date:  [id number] Attending:  Ronaldo Miyamoto CC:         Corey [name], M.D.  CV Laboratory   Cardiac Catheterization  INDICATIONS:  Corey [name] is a 75 year old, well known to me.  He has had previous coronary intervention and then subsequent revascularization surgery. He had a myocardial infarction originally in 1985.  He has had increasing shortness of breath and some neck discomfort recently, and was brought back in for repeat reevaluation.  He had had a stent placed to a large diagonal branch in 1998.  At repeat catheterization he had a high-grade stenosis just prior to the previously placed stent leading into the diagonal, and a progressive stenosis in the posterior descending beyond the graft insertion.  We sent him home because of his diabetic status to recover from the contrast load, and he was brought back today for repeat evaluation with planned percutaneous intervention.  He was started on Plavix.  His renal function was normal.  PROCEDURES: 1. Percutaneous angioplasty of the left anterior descending leading to the    diagonal. 2. Successful percutaneous stenting of the posterior descending after the    graft insertion.  DESCRIPTION OF PROCEDURE:  The patient was brought to the catheterization lab, prepped and draped in the usual fashion.  Xylocaine 1% was used for local anesthesia.  Through an anterior puncture the left femoral artery was easily entered.  A 7 French sheath was placed.  Heparin was given according to protocol and ACT carefully monitored.  Double bolus Integrilin was also utilized.  The left main was intubated with a JL 3.5 guiding catheter.  After adequate anticoagulation the lesion was crossed with a 0.014 Hi-Torque Floppy wire and one inflation was performed at 8 atmospheres for approximately  1-2 minutes using a 2.5 x 10 mm Guidant CrossSail balloon.  There was marked improvement in the appearance of the vessel.  Because there was modest segmental narrowing throughout the entire previously placed stent but without a high-grade narrowing, it was felt that balloon dilatation of this short lesion would be most optimal.  The JL 3.5 guiding catheter was subsequently removed and exchanged for a right coronary bypass catheter with side holes. Views of the PDA were then obtained through he saphenous vein graft.  The lesion was crossed with a 0.014 Hi-Torque Floppy wire.  Progressive dilatations were performed, first using a 2.25 x 15 mm CrossSail and then a 2.5 x 20 mm CrossSail balloon.  Despite this, the patient had some elastic recoil and we elected to place a 2.5 x 23 mm length Guidant Pixel stent. There was marked improvement in the appearance of the artery.  The entry to the stent was at a slight hinge point, but there appeared to be noncritical narrowing and good flow into this area.  The procedure was completed and then he was taken to the holding area in satisfactory clinical condition.  HEMODYNAMIC DATA:  The central aortic pressure was 118/70.  ANGIOGRAPHIC DATA:  The left anterior descending artery is heavily calcified. There is about an 80% stenosis just beyond the septal perforator leading into the diagonal.  The diagonal has been previously stented, and there is evidence of about 50% narrowing throughout the stent.  This was not dilated.  The focal lesion was dilated from 80% to 20% with a good angiographic result.  The saphenous vein graft leading into the posterior descending artery is widely patent.  There is a bend point in the PDA which has been previously dilated with a widely patent lumen.  There is a segmental area of about 80% beyond this point, and following balloon dilatation there was elastic recoil which was then relieved by stenting.  There was perhaps  30% narrowing just beyond the stent.  Final angiographic result was excellent.  CONCLUSIONS: 1. Successful angioplasty of the left anterior descending stenosis just    beyond the septal perforating vessel leading into the large diagonal. 2. Successful percutaneous stenting of the posterior descending branch    through the previously placed saphenous vein graft.  DISPOSITION: 1. The patient will be treated with Plavix. 2. Glucophage will be held for 48-72 hours. 3. The patient will be continued on his same medications. DD:  03/30/00 TD:  03/31/00 Job: [id number]

## 2010-07-22 NOTE — H&P (Signed)
NAME:  Corey [name], Corey [name]                 ACCOUNT NO.:  [id number]   MEDICAL RECORD NO.:  [id number]          PATIENT TYPE:  INP   LOCATION:  1825                         FACILITY:  [facility]   PHYSICIAN:  Jackie Plum, M.D.DATE OF BIRTH:  [birth year]   DATE OF ADMISSION:  07/13/2004  DATE OF DISCHARGE:                                HISTORY & PHYSICAL   CHIEF COMPLAINT:  Severe bilateral lower extremity pain.   HISTORY OF THE PRESENT ILLNESS:  The patient is a 75 year old gentleman with  multiple problems who presented to the ED today because of the above-  complaint.  According to the patient and his son he had been in his usual  state of health until Monday, Jul 11, 2004, when he woke up with pain in his  groin.  He saw his primary care physician the next day who prescribed  Skelaxin and oxycodone for presumed stretched muscle.  By Tuesday evening,  Jul 12, 2004, he woke up with resolution of his groin pain, which at that  time had gone to his inner ankle area with aggressive foot pain.  He  therefore came to the ER on Jul 13, 2004 for further evaluation.  He also  indicates that in the ER the pain moved to his right ankle area and forefoot  area on the lateral aspect of this extremity.  He also complained of  pain  in his lateral right knee joint area.  The patient has not been able to  ambulate because of his severe pain in both extremities according to the  patient.  The last time he was able to walk was more than three days ago.  He has had some fever without chills.  His appetite has been decreased with  nausea without any vomiting.  He has had some difficulty trying to lift up  his extremities.  He denies any back pain, denies any visual changes and  headaches.  He denies any abnormal sensations involving his extremities.  The pain was variously described as sore, hurts and aches.  He denies any  tingling or burning sensation component to the pain.   Because of his continued pain,  which he indicates has been significantly  relieved after receiving morphine in the ER; however, he continues not to be  able to walk.  The hospitalist decided on admission.   PAST MEDICAL HISTORY:  The patient recently had cardiac surgery done on the  10th of last month according to the patient.  He had coronary artery bypass  grafting done at that time.  His medical problems include:  1.  History of recurrent coronary occlusive disease with CHF and mitral      valve regurgitation.  2.  Hypertension.  3.  Dyslipidemia.  4.  Type 2 diabetes.  5.  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  6.  Moderate COPD with emphysematous changes on CT scan.  7.  History of nephrolithiasis.  8.  History of 40-60% left internal carotid artery stenosis.  9.  The  patient is status post cervical laminectomy and inguinal hernia      repair.   ALLERGIES:  The patient is allergic to CODEINE.   MEDICATIONS:  The patient's medicines include:  1.  Aspirin 81 mg daily.  2.  Toprol XL 25 mg daily.  3.  Lanoxin 0.125 mg daily.  4.  Coumadin, the dose of which is unclear.  5.  Actos.  6.  Lasix.  7.  The patient also takes Allegra 110 mg daily.   FAMILY HISTORY:  The patient's family history is positive for hypertension.   SOCIAL HISTORY:  The patient is single.  He lives in his own home in  [place].  His son comes to the house to check on the patient and actually  sleeps there at night.  He does not smoke cigarettes nor drink alcohol.   REVIEW OF SYSTEMS:  The review of systems is significant for the positive  negatives as indicated in the HPI, but otherwise review of systems is  unremarkable.   PHYSICAL EXAMINATION:  VITAL SIGNS:  Temperature is 100.9 degrees  Fahrenheit.  BP 126/34, pulse 84 and respirations 20.  The O2 saturation is  96% on room air.  GENERAL APPEARANCE:  The patient is in no acute  cardiopulmonary or painful  distress at the time of my evaluation.  He is not ill-looking.  HEENT:  Normocephalic and atraumatic.  Pupils equal, round and react to  light.  Extraocular movements are intact.  NECK:  The neck is supple.  No JVD.  CHEST:  The patient has a clean recent sternotomy scar without any obvious  evidence of secondary infection.  LUNGS:  The lung fields are clear to auscultation.  HEART:  Cardiac exam is regular rate and rhythm.  No gallops or murmurs.  ABDOMEN:  The abdomen is soft and nontender.  EXTREMITIES:  The patient has trace bipedal edema without any cyanosis.  He  expresses severe painful response to the slightest touch involving the areas  of his medial left ankle area, lateral right ankle area and lateral right  knee region.  Dorsalis pedis pulses are present in both extremities.  Femoral pulses are present in both extremities especially.  His left femoral  pulse is carefully reassessed and it is also present. He does not have any  evidence of erythema of the skin.  Capillary refill is less than two  seconds;  this is in both extremities.  NEUROLOGIC EXAMINATION:  The patient is alert and oriented times three.  Power is about 4/5 in his left lower extremity and about 3-4/5 in his right  lower extremity.  Deep tendon reflexes are symmetrical, 1+ in both lower  extremities.  The sensation is intact in both extremities.  The patient is  also observed to have significant painful response to touch involving all  areas described above in the extremities as well as his right plantar area.   LABORATORY DATA:  WBC count 9.4, hemoglobin 12.2, hematocrit 36.0, platelet  count 234,000, and MCV 98.1.  His ESR is 50, which is increased with the  upper limits of normal being 20 mm/hour.  His pro time is 40.3.  Sodium 134,  potassium 4.2, chloride 101, glucose 123, BUN 17, and creatinine 1.1.  IMPRESSION:  [age]-year-old gentleman with history of diabetes and  other  multiple medical problems who presents with ambulatory dysfunction related  to painful adjacent lower extremity joint areas with significant  hyperesthesia.  The patient does not have any evidence of  synovitis  involving the joints or any evidence of joint infections.  This is not  reminiscent of true migratory polyarthralgias.  He does no have any evidence  of segmental gonococcal arthritis, Parvovirus, mumps or [facility]  spotted fever.  The patient's symptoms are very confusing; however, I cannot  rule out possible neuropathic pain, probably from diabetes.  The levels of  control of his diabetes is unclear.  He seems to have some difficulty with  his power function involving the lower extremities.  He has a fever of 100.9  degrees Fahrenheit with elevated erythrocyte sedimentation rate.  He denies  any back pain, however.   PLAN:  The plan is to:  1.  Admit the patient to the hospital.  2.  Obtain complete electrolytes involving calcium and phosphorus.  3.  We will also obtain an MRI of the lumbosacral spine to rule out any      incipient low-grade abscess or some sort of infectious disease with      peripheral neuropathy related to this.  The patient may actually have      diabetic neuropathy with consequent ambulatory difficulties.  We will      therefore initiate Neurontin with adjunctive analgesics for better pain      control.  4.  The patient will need occupational therapy evaluation when his pain is      better controlled.  5.  We will need to clarify the dosages of his more current medication      regimen in the morning.   Mr. Dermody does not have any evidence of DVT, cellulitis or arterial embolic  phenomenon.  Further recommendations will be made as suited.      GO/MEDQ  D:  07/14/2004  T:  07/14/2004  Job:  [id number]   cc:   Arturo Morton. [name], M.D. [facility]   Sheliah Plane, MD  [address]  [place]  [place] [address]  Email:  [email or web address]

## 2010-07-22 NOTE — H&P (Signed)
NAME:  Corey [name], Corey [name]                           ACCOUNT NO.:  [id number]   MEDICAL RECORD NO.:  [id number]                   PATIENT TYPE:  INP   LOCATION:  1830                                 FACILITY:  [facility]   PHYSICIAN:  Charlton Haws, M.D.                  DATE OF BIRTH:  October 24, 1935   DATE OF ADMISSION:  06/04/2003  DATE OF DISCHARGE:                                HISTORY & PHYSICAL   REASON FOR ADMISSION:  The patient is a 75 year old male, well known to  USAA D. [name], M.D., with a history of severe ischemic cardiomyopathy,  status post previous bypass surgery and subsequent percutaneous  interventions, history of inducible ventricular tachycardia treated with a  biventricular ICD in 2003 by [name]. [name], M.D., and history of  congestive heart failure.  He now presents to the emergency room with  symptoms suggestive of his typical anginal equivalent.   The patient reports a two-week history of progressive upper throat  discomfort clearly associated with exertion and improved with rest.  He  states that this is reminiscent of his symptoms prior to CABG in 1995 and  more recently prior to undergoing percutaneous intervention in 2002.  He  states that he had been doing well since that time and in fact, reports  never having any history of anterior chest discomfort.  His recent symptoms  are not associated with any radiation to the upper extremities nor any  associated diaphoresis or nausea.  In addition to the throat discomfort, he  also notes worsening exertional chest pain, but denies any recent paroxysmal  nocturnal dyspnea or worsening pedal edema.  He also denies feeling any  discharges from his defibrillator.   After contacting Dr. [name] earlier today when he developed recurrent chest  pain, the patient decided to drive himself to the emergency room this  evening rather than presenting to the local emergency room near [facility].  He did take four  nitroglycerin, which he reported were old with  some relief, but the symptoms recurred.  The patient is currently  asymptomatic and no acute EKG changes are noted on electrocardiogram, which  shows normal sinus rhythm with ventricular pacing.  A chest x-ray shows no  acute changes.   ALLERGIES:  CODEINE.   CURRENT MEDICATIONS:  1. Plavix.  2. Full-dose aspirin.  3. Lasix 40 mg b.i.d.  4. Potassium chloride one-and-a-half tablets daily.  5. Glucophage 500 mg b.i.d.  6. Lotensin 10 mg daily.  7. Digoxin 0.125 mg daily.  8. Toprol XL 50 mg daily.   PAST MEDICAL HISTORY:  1. Coronary artery disease.     a. Status post four-vessel CABG in 1995:  SVG to PD to PLA, SVG to second        obtuse marginal, and LIMA to LAD.     b. EF 30-35% by catheterization in 2003.  c. Patent grafts with patent LAD and PDA stent sites by coronary        angiogram in March of 2003.  2. History of inducible monophonic ventricular tachycardia.     a. Status post Guidant biventricular ICD in October of 2003.  3. Left bundle branch block.  4. History of congestive heart failure.  5. Type 2 diabetes mellitus.  6. Hypertension.  7. History of dyslipidemia.  8. GERD.  9. Interstitial lung disease.  10.      Status post inguinal hernia repair.  11.      Status post cervical laminectomy.  12.      History of nephrolithiasis.   SOCIAL HISTORY:  The patient is widowed and lives alone in [place], [place].  He has two grown boys.  He has not smoked tobacco since 1987.  Denies alcohol use.   FAMILY HISTORY:  A brother deceased at age 64 secondary to MI.  A brother  deceased at age 75 of sudden death.   REVIEW OF SYSTEMS:  The patient reports treatment with amoxicillin for  recent sinus infection, but otherwise denied any recent fever, chills, or  sweats.  Denies any recent evidence of upper or lower GI bleed.  Has  occasional reflux symptoms.  Otherwise as per HPI.  Remaining systems  negative.    PHYSICAL EXAMINATION:  VITAL SIGNS:  Blood pressure 142/76, pulse 70,  respirations 20, temperature 97.5 degrees, CO2 96% on room air.  GENERAL APPEARANCE:  A 75 year old male in no apparent distress.  HEENT:  Normocephalic and atraumatic.  NECK:  Preserved bilateral carotid pulses without bruits.  Jugular venous  distention noted on the right.  LUNGS:  Clear to auscultation in all fields.  HEART:  Regular rate and rhythm (S1 and S2).  Soft 1/6 systolic ejection  murmur.  ABDOMEN:  Soft and nontender with no pulsatile mass.  EXTREMITIES:  Preserved bilateral femoral pulses without bruits.  Intact  distal pulses with trace to 1+ pedal edema.  NEUROLOGIC:  No focal deficit.   LABORATORY DATA:  Chest x-ray:  Cardiomegaly; NAD.   Electrocardiogram:  Normal sinus rhythm at 70 bpm with ventricular pacing.   CPK-MB 3.4, troponin I less than 0.05.  Hemoglobin 15, hematocrit 44, WBC  4.6, platelets 206.  Sodium 136, potassium 3.4, BUN 17, creatinine 1.0,  glucose 297.  INR 0.9.   IMPRESSION:  1. Recurrent throat discomfort.     a. Probable anginal equivalent.  2. Severe ischemic cardiomyopathy.     a. Status post coronary artery bypass graft in 1995; stent to left        anterior descending and posterior descending artery in 2002.     b. Patent grafts at stent sites on catheterization in 2003.  3. Status post biventricular implantable cardioverter defibrillator.     a. History of inducible ventricular tachycardia.  4. Type 2 diabetes mellitus.  5. Hypertension.  6. History of mitral regurgitation.  7. History of congestive heart failure.   PLAN:  The patient will be admitted for rule out of myocardial infarction  and plans to proceed with diagnostic coronary angiography in the morning.  The patient is agreeable with this plan and is willing to proceed.  He will  be continued on intravenous heparin and nitroglycerin, as well as aspirin, Plavix, and home medications, including beta  blocker and Lasix.  Glucophage  will be placed on hold and the patient will be treated in the interim with  sliding scale insulin.  Gene Serpe, P.A. [facility]                      Charlton Haws, M.D.    GS/MEDQ  D:  06/04/2003  T:  06/04/2003  Job:  [id number]   cc:   Talmage Coin, M.D.  [place], [place]

## 2010-07-22 NOTE — Assessment & Plan Note (Signed)
[facility]                            CARDIOLOGY OFFICE NOTE   ROBINSON, BRINKLEY                          MRN:          [id number]  DATE:03/12/2006                            DOB:          May 21, 1935    [name] is in for followup.  GENERAL: He is stable. He is feeling relatively well. He does have some  neck discomfort and he has been using magnets which I strongly  encouraged him not to do today. He denies any on going chest pain or  significant shortness of breath. He appears to be in underlying atrial  fibrillation although it also has the appearance of possibly being  underlying atrial flutter, and potentially could be an ablatable rhythm  so we are going to have him see Dr. Lewayne Bunting with regard to this.   He has been followed in the Coumadin clinic and has been followed  closely.   On his exam today, the weight is 214 pounds, blood pressure 130/87,  pulse 72.  LUNG FIELDS: Clear. There is more of a right carotid bruits, than a left  carotid bruits.  ABDOMEN: Soft.  HEART:  PMI is nondisplaced and there is a normal first and second heart  sound with soft aortic murmur.   Overall [name] is stable. There is a potential that he is treatable  from the standpoint of his flutter and I will have him see Lewayne Bunting  back in follow up. We will see him back in approximately 1 month and he  will continue on anticoagulation. Whether or not we ought to consider  antiarrhythmic drug therapy and/or conversion to sinus rhythm as he had  another issue that potentially is worth addressing given his LV  dysfunction. We will check a TSH, CBC, and BMET at this point.     Arturo Morton. [name], MD, [facility]  Electronically Signed    TDS/MedQ  DD: 03/12/2006  DT: 03/12/2006  Job #: [id number]

## 2010-07-22 NOTE — Op Note (Signed)
NAME:  Corey [name], FUHS NO.:  [id number]   MEDICAL RECORD NO.:  [id number]          PATIENT TYPE:  INP   LOCATION:  2302                         FACILITY:  [facility]   PHYSICIAN:  Zenon Mayo, MDDATE OF BIRTH:  [birth year]   DATE OF PROCEDURE:  06/13/2004  DATE OF DISCHARGE:                                 OPERATIVE REPORT   PROCEDURE:  Interoperative transesophageal echocardiogram.   INDICATIONS FOR PROCEDURE:  Evaluation of mitral valve.   DESCRIPTION OF PROCEDURE:  Mr. Kendrix is a 75 year old male with a history  of hypertension, coronary artery disease, diabetes, and mitral valvular  disease, who is brought to the operating room by Dr. Tyrone Sage today for redo  coronary artery bypass grafting as well as repair or replacement of his  mitral valve.  The patient was brought to the operating room and placed  under general anesthesia.  After confirmation of endotracheal tube  placement, a transesophageal echo probe was placed into the esophagus  without complications.  The left ventricle was imaged first and revealed  global hypokinesis with an ejection fraction estimated to be 30%.  The  ventricle was mildly dilated but no left ventricular hypertrophy was noted.  The mitral valve when imaged planimetry appeared to move well.  The leaflets  were freely mobile with no vegetations or calcifications seen.  When color  Doppler was placed across the valve, a large central regurgitant jet was  seen.  The mitral regurg was quantified as moderate.  There was systolic  suppression of flow in the pulmonary veins, however, there was no reversal  of flow seen.  The aortic valve was trileaflet and was free of vegetation or  calcification.  The aortic valve area was 3 cm square.  There was no aortic  insufficiency noted.  The pulmonic and tricuspid valves were normal in  appearance and function.  The intra-atrial septum was imaged and no  abnormality was seen there.  The  thoracic aorta was free of atherosclerotic  disease.   At the conclusion of bypass, inotropes including Dopamine and Milrinone were  begun to help support the patient's blood pressures as well as cardiac  output.  A prosthetic ring had been placed across the mitral valve and  appeared to be seated well with good function.  There was no mitral  regurgitation noted at the conclusion of bypass.  The aortic cannula was  removed without evidence of dissection.  All other structures in the heart  were functioning the same as they had been prior to bypass.  The patient was  taken from the operating room directly to the intensive care unit in stable  condition.  Prior to transported to the intensive care unit, the  transesophageal echo probe was removed without complications.      WEF/MEDQ  D:  06/13/2004  T:  06/13/2004  Job:  [id number]

## 2010-07-22 NOTE — Consult Note (Signed)
NAME:  Corey [name], Corey [name] NO.:  [id number]   MEDICAL RECORD NO.:  [id number]          PATIENT TYPE:  INP   LOCATION:  3714                         FACILITY:  [facility]   PHYSICIAN:  Sheliah Plane, MD    DATE OF BIRTH:  10/24/1935   DATE OF CONSULTATION:  06/10/2004  DATE OF DISCHARGE:                                   CONSULTATION   CONSULTING PHYSICIAN:  Sheliah Plane, M.D.   REQUESTING PHYSICIAN:  Arturo Morton. [name], M.D. [facility].   FOLLOWUP CARDIOLOGIST:  Arturo Morton. [name], M.D. [facility].   EP PHYSICIAN:  [name]. [name], M.D.   PRIMARY CARE PHYSICIAN:  Dr. Rosalia Hammers [place].   REASON FOR CONSULTATION:  Recurrent coronary occlusive disease with  congestive heart failure and mitral regurgitation.   HISTORY OF PRESENT ILLNESS:  The patient is a 75 year old male who has a  long history of cardiac disease who presents earlier this week with  increasing shortness of breath over the past six months associated with neck  pain.  He denies pedal edema but does note increasing orthopnea, a  progressive cough, and fatigue.  He has had at least one month of crescendo  symptoms.  He has not had a recent acute myocardial infarction but has a  history of myocardial infarctions in 1987 while visiting [place], and  subsequently in 1995.  He has had at least 13 prior cardiac  catheterizations, angioplasties, and/or stents.  He has had stents placed to  the diagonal coronary artery in 1998, 2002, had stents placed in the right  vein graft when it occluded.   PRIOR SURGERY:  1.  Coronary artery bypass graft x 4 with the left internal mammary to the      LAD, sequential reverse saphenous vein graft to the PD, and PL, and      reverse saphenous vein graft to the second obtuse marginal.  2.  He has had a recent TE done today which shows left atrial enlargement      with no thrombus, 3+ MR, severe LV dysfunction with approximately 20-25%      ejection fraction and global hypokinesis.   CARDIAC RISK FACTORS:  1.  Hypertension.  2.  Hyperlipidemia.  3.  Type 2 diabetes for many years.  4.  He is a remote smoker, quit in 1987.  5.  He does have a positive family history for cardiac disease.  One brother      died at age 25 of myocardial infarction and one brother died at age 52      of sudden death.   1.  He has had no previous stroke, denies claudication.  2.  Has at least moderate COPD with emphysematous changes noted on serial CT      scans done in September 2003, the most recent one.  3.  He has a history of inducible monomorphic VT and had placement of an      AICD device in December 11, 2001.  4.  He has also had cervical laminectomy.  5.  Inguinal hernia repair.   SOCIAL HISTORY:  The patient  is a widower.  His first wife died of a brain  tumor.  He has been married one other time and divorced.  Currently, he  lives with a significant other, remains active in farming and fishing.  He  has two sons.  The patient is retired.   MEDICATIONS:  1.  Plavix which was stopped on June 09, 2004.  2.  Aspirin 325 mg  a day.  3.  Lasix 40 mg b.i.d.  4.  Lanoxin 0.125 daily.  5.  Toprol XL 50 mg a day.  6.  Lotensin 10 mg daily.  7.  K-Dur 20 mEq.  8.  Actos 15/50 b.i.d.  9.  Allegra 180 mg a day.   ALLERGIES:  CODEINE.   CARDIAC REVIEW OF SYSTEMS:  Positive for chest pain and throat pain.  Denies  resting shortness of breath.  He has had three months of increasing dyspnea  on exertion with a crescendo increase of symptoms the night prior to  admission.  He has two-pillow orthopnea which is new.  He denies syncope or  presyncope.  His defibrillator has never fired.   GENERAL REVIEW OF SYSTEMS:  The patient denies any constitutional symptoms  such as fever, chills.  RESPIRATORY:  He does have a cough.  GASTROINTESTINAL:  Positive for GERD symptoms.  He notes in the past that  his skin changed color but this resolved after placement of a  defibrillator.  It is  unclear what this was related to if he developed liver  insufficiency from a medication.  He has some evidence of degenerative  arthritis and has cervical disk disease at C3-C4 and C5-C6.  He has a  history of nephrolithiasis.  History of easy bruisability.  History of  diabetes.  He has had no TIAs.  Carotid Doppler studies show no right  internal carotid stenosis and a 40-60% left internal carotid stenosis.   PHYSICAL EXAMINATION:  VITAL SIGNS:  His blood pressure is 115/56, heart  rate is in the 60s, respiratory rate 14, O2 sat is 94%.  He is 68 inches  tall, weighs 218 pounds.  GENERAL:  The patient appears fit in no distress.  HEENT:  Pupils are equal, round and reactive to light.  NECK:  Without carotid bruits.  LUNGS:  He has decreased breath sounds bilaterally but without active  wheezing.  He is able to lay flat at this point.  Two days ago he was not  able to do this.  CHEST:  I do not appreciate any murmur on physical examination, specifically  listening for a murmur of mitral regurgitation is not easily audible.  ABDOMEN:  Benign without palpable masses.  GU:  Normal.  NEUROLOGIC:  Grossly intact.  LYMPHATICS:  He has no palpable lymph nodes in the cervical or  supraclavicular area.  SKIN:  Without ischemic changes.  He has had vein previously harvested from  the right lower leg to just above the right knee.  EXTREMITIES:  He has 2+ DP and PT pulses bilaterally.   LABORATORY DATA:  Includes a white count of 6.7, hemoglobin 14.5, hematocrit  of 41.  PT is 12, INR is 0.9, PTT is 25.  Sodium 134, potassium 3.8, BUN 23,  creatinine 1.1, glucose is 253.  Room air blood gas reveals a pH of 7.4,  pCO2 of 41, pO2 93.  Cardiac catheterization has revealed the patient has  severe three-vessel coronary disease with occlusion of the right coronary  artery.  He has a ramus  branch that is moderate size without significant stenosis.  The native circumflex is a small probably not  bypassable branch.  A totally occluded second obtuse marginal fills via a diseased vein graft  and looks reasonable for bypass.  The native right coronary artery is  occluded.  The vein graft that goes to the TD and PL has got diffuse  degenerative graft.  The overall ejection fraction appears 25%.  This is  similar to the echocardiogram results which show at least 2 to 3+ MR central  jet and global hypokinesis.   IMPRESSION:  The patient is admitted with progressive congestive heart  failure symptoms and anginal symptoms.  He is noted to have pulmonary  hypertension at the time of catheterization with PA pressure 55/29 and a  wedge of 36.   At this point, the patient could consider redo coronary artery bypass  grafting with replacement of vein graft to the PD and PL and replacement of  the vein graft to the circumflex leaving the LAD alone.  In addition, would  consider mitral valve ring because of dilated cardiomyopathy with ischemic  MR.  Prior to proceeding with surgery, we have stopped the Plavix.  We will  obtain a bleeding time.  Aggressively treat the patient's congestive heart  failure including adding aldactone to his current regimen.  Then we will  discuss options with Dr. [name] on the patient.  As the proposed  reoperation and mitral valve carries significant risk because of the redo  status and also because of the significant deterioration in overall  ventricular function.      EG/MEDQ  D:  06/10/2004  T:  06/10/2004  Job:  [id number]   cc:   patient's chart

## 2010-07-22 NOTE — Discharge Summary (Signed)
NAME:  Corey [name], Corey [name]                           ACCOUNT NO.:  [id number]   MEDICAL RECORD NO.:  [id number]                   PATIENT TYPE:  INP   LOCATION:  2032                                 FACILITY:  [facility]   PHYSICIAN:  Jonelle Sidle, M.D. [facility]        DATE OF BIRTH:  [birth year]   DATE OF ADMISSION:  DATE OF DISCHARGE:  11/08/2001                           DISCHARGE SUMMARY - REFERRING   PROCEDURE:  CT of the chest.   HOSPITAL COURSE:  Mr. Beitler is a 75 year old male with known coronary  artery disease who had bypass surgery in 1995.  He went to an Urgent Care  Center because of increasing shortness of breath as well as dyspnea on  exertion, and cough.  He was felt to have volume overloaded heart failure  and was sent to [facility] for further evaluation and treatment.   Mr. Veltre was treated with IV diuretics and diuresed about 3-1/2 liters  during the course of his hospital stay.  As his diuresis increased his  symptoms decreased.  As he reached his dry weight of approximately 208  pounds his diuretics were changed back to oral at the same dose he had been  on prior to admission.  He is to weigh himself daily and follow up in the  office for this.   Mr. Marlett has a history of an ischemic cardiomyopathy with an EF of 30-35  percent.  He had some nonsustained V-tach that was monomorphic and started  with a PVC.  He was seen by electrophysiology and it was felt that as he was  asymptomatic he should be continued on a beta blocker and he could be  followed as an outpatient and EPS scheduled at that time.   There was concern for noncardiac causes of his shortness of breath so CT of  the chest was obtained.  His chest x-ray demonstrated diffuse interstitial  lung disease.  A CT of the chest was obtained which showed a 3 mm  noncalcified left upper lobe pulmonary nodule.  There was also a calcified  granuloma in the left lower lobe.  A CT of the chest in 4-6 months  was  suggested to confirm stability.  There were shotty mediastinal  lymphadenopathy, although no nodes were pathologically enlarged.  There was  underlying minimal interstitial disease with improving pulmonary edema.  Emphysema with subpleural blebs noted in both lung apices was also seen.  He  is to follow up with his primary care physician for this.   He had been on Glucophage prior to admission for diabetes but this was held  while he was in the hospital secondary to the possibility of needing  procedures and dye interaction with the CT.  He is to resume Glucophage as  an outpatient and follow up with his primary care physician for diabetic  control.   With stabilization of his respiratory status and no further in-hospital  workup  needed he was considered stable for discharge on 11/08/01.   LABORATORY DATA:  Hemoglobin 13.0, hematocrit 38.9, WBC 5.0, platelets 187.  D-dimer 0.25, sodium 137, potassium 3.9, chloride 100, CO2 27, BUN 19,  creatinine 1.0, glucose 145.  Serial CK/MB and troponin are negative for MI.  CNP 309, cholesterol total 176, triglycerides 236, HDL 29, LDL 100, TSH  3.759.  Digoxin level 0.2.  Urinalysis negative.   DISCHARGE CONDITION:  Improved.   CONSULTATION:  Electrophysiology.   DISCHARGE DIAGNOSES:  1. Congestive heart failure.  2. Ischemic cardiomyopathy with an ejection fraction of 30-40 percent by     echocardiogram in 03/03.  3. Status post aortocoronary bypass surgery in 1995 with saphenous vein     graft to posterior descending artery and PT, saphenous vein graft to     obtuse marginal, and left internal mammary artery to left anterior     descending.  4. Status post percutaneous transluminal coronary angioplasty to the left     anterior descending and posterior descending artery in 2002.  5. Previous admission for congestive heart failure in 05/2001.  6. Minimal interstitial disease particularly in bilateral lung bases with 3     mm  noncalcified left upper lobe pulmonary nodule as well as calcified     granuloma in the left lower lobe, followup CT in 4-6 months.  7. Dyslipidemia.  8. Non-insulin-dependent diabetes mellitus.  9. Hypertension.  10.History of gastroesophageal reflux disease symptoms.  11.Status post inguinal hernia repair and cervical laminectomy.  12.History of nephrolithiasis.   DISCHARGE INSTRUCTIONS:  1. His activity level is not restricted.  2. He is to weigh himself daily.  3. He is to take 1 extra potassium tablet a day.  4. He is to see Dr. [name] on 10/02 at 9:30 a.m.  5. He is to see Dr. [name] on 11/25/01 at 12:15.   DISCHARGE MEDICATIONS:  1. Toprol XL 25 mg q.d.  2. Digoxin 0.125 mg q.d.  3. Aspirin 325 mg q.d.  4. Plavix 75 mg q.d.  5. Lasix 40 mg b.i.d.  6. K-Dur  20 mEq q.d.  7. Glucophage to be resumed at the home dose of 500 mg p.o. b.i.d.     Lavella Hammock, P.A. [facility]                  Jonelle Sidle, M.D. [facility]    RG/MEDQ  D:  11/08/2001  T:  11/10/2001  Job:  [id number]   cc:   Arturo Morton. [name], M.D. [facility]   Cecil Cranker

## 2010-07-22 NOTE — H&P (Signed)
NAME:  Corey [name], Corey [name]                 ACCOUNT NO.:  [id number]   MEDICAL RECORD NO.:  [id number]          PATIENT TYPE:  INP   LOCATION:                               FACILITY:  [facility]   PHYSICIAN:  Arturo Morton. [name], M.D. [facility] OF BIRTH:  [birth year]   DATE OF ADMISSION:  06/08/2004  DATE OF DISCHARGE:                                HISTORY & PHYSICAL   CHIEF COMPLAINT:  Throat pain.   HISTORY OF PRESENT ILLNESS:  Corey [name] is a very delightful gentleman, well  known to me for many, many years.  He presents now with some recurrent  throat pain and limitation of activity producing the throat pain.  This has  been his typical angina over the years.  He is now 75 years of age, and he  has had prior revascularization surgery.  He also had stenting of the  saphenous vein grafts to the right coronary artery in April 2005, by Dr.  Juanda Chance.  The distal SVG to the RCA was stented from 99 to 0 and the mid SVG  to the RCA stented from 60 to 0.  Both a Cypher and a TAXUS were used for  this.  It was protected by distal protection.  His mammary to the LAD is  known to be patent and the stents that I put in leading into the diagonal  were also patent.  Vein graft to the distal circumflex was intact.  He  thinks that he has his recurrent typical symptoms since that time, and  therefore he does feel that he needs to be restudied.   MEDICATIONS:  1.  Aspirin 325 mg daily.  2.  Plavix 75 mg daily.  3.  Lasix 40 mg p.o. b.i.d.  4.  Lanoxin 0.125 mg daily.  5.  Toprol XL 50 mg daily.  6.  Lotensin 10 mg p.o. daily.  7.  K-Dur 20 mEq 1-1/2 tabs daily.  8.  Actoplus 15/50, 500 b.i.d.  9.  Allegra 180 mg p.o. daily.   There are no known drug allergies.   PAST MEDICAL HISTORY:  1.  Coronary artery bypass graft surgery in 1995.  2.  Hypertension.  3.  Hypercholesterolemia.  4.  Diabetes.  5.  History of defibrillator cardiopacer placement.  6.  History of left finger surgery.  7.  History of  inguinal hernia surgery.  8.  History of degenerative arthritis.   SOCIAL HISTORY:  The patient is single.  He lives in the [place]  area.  He last worked in 1987 and stopped at the time of his myocardial  infarction.   FAMILY HISTORY:  Remarkable for hypertension.  His mother died with renal  disease and hypertension, and his father had rheumatoid arthritis.   REVIEW OF SYSTEMS:  The patient has some shortness of breath.  He has also  had some chronic neck pain.  The review of systems is otherwise  unremarkable.   PHYSICAL EXAMINATION:  GENERAL:  He is an alert and oriented male in no  distress.  VITAL SIGNS:  Blood pressure 128/74, pulse 58, respirations 16  and  unlabored.  Temperature is not available.  NECK:  JVP is not increased.  CHEST:  The defibrillator placement is satisfactory.  The lung fields are  clear to auscultation and percussion.  Cardiac rhythm is regular.  ABDOMEN:  Soft.  EXTREMITIES:  No edema other than trace edema.  NEUROLOGIC:  Nonfocal and intact.   EKG is pending at the present time.   IMPRESSION:  1.  Recurrent symptomatic ischemic heart disease.  2.  History of multiple allergies.  3.  Hypertension.  4.  Noninsulin-dependent diabetes mellitus.  5.  Prior coronary artery bypass graft surgery.  6.  Other problems as listed.   PLAN:  The patient feels that he needs a cardiac catheterization, and I am  inclined to agree with him.  We will make arrangements for admission to the  hospital.       ___________________________________________  Arturo Morton. [name], M.D. [facility]    TDS/MEDQ  D:  06/08/2004  T:  06/08/2004  Job:  [id number]

## 2010-07-22 NOTE — Discharge Summary (Signed)
NAME:  Corey [name], Corey [name]                           ACCOUNT NO.:  [id number]   MEDICAL RECORD NO.:  [id number]                   PATIENT TYPE:  INP   LOCATION:  3305                                 FACILITY:  [facility]   PHYSICIAN:  Charlton Haws, M.D.                  DATE OF BIRTH:  08-22-1935   DATE OF ADMISSION:  06/04/2003  DATE OF DISCHARGE:  06/06/2003                                 DISCHARGE SUMMARY   PROCEDURES:  1. Cardiac catheterization.  2. Coronary arteriogram.  3. Left ventriculogram.  4. Graft angiogram.  5. Left internal mammary artery arteriogram.  6. Repeat cardiac catheterization with percutaneous intervention.   HOSPITAL COURSE:  [name] is a 75 year old male with known coronary  artery disease.  He has had bypass surgery as well as percutaneous coronary  intervention subsequent to that.  He also had inducible VT and is status  post CT in 2003.  He began having progressive and typical anginal symptoms  for him and was admitted for further evaluation and treatment.   His angina is described as a throat discomfort.  He was treated medically  and had slightly elevated troponins that peaked at 0.17, but his CK-MBs were  normal.  It was felt that cardiac catheterization was indicated and this was  performed on June 05, 2003.   The cardiac catheterization showed circumflex that was totaled, RCA with 99%  stenosis and two diagonals, one with an 80% stenosis and a branch that was  totaled.  The LAD itself was totaled, but the LIMA to the LAD was patent.  The SVG to PDA had a 99% stenosis.  His EF was 30% with anteroapical  akinesis and inferoapical akinesis as well as diffuse hypokinesis.  Dr.  Antoine Poche evaluated the films and felt that percutaneous coronary  intervention of the SVG to RCA was indicated.  This was performed on June 05, 2003.   [name] had PTCA and stent of the distal SVG to RCA reducing that  stenosis from 99% to 0 as well as to the mid SVG to RCA  reducing that  stenosis from 60% to 0.  There was a small to moderate amount of debris  recovered.  Distal protection was used.   Post procedure, [name] complained of a post nasal drip, but no chest  pain or shortness of breath.  The next day, he had low-grade fever  initially, but this resolved without any intervention.  His temperature was  checked and was within normal limits.  His other laboratory values were also  within normal limits.  [name] was considered stable for discharge on  June 06, 2003, without patient followup.   LABORATORY DATA AND X-RAY FINDINGS:  Hemoglobin 13.4, hematocrit 39.2, WBC  4.7 and platelets 194.  Sodium 137, potassium 3.7, chloride 104, CO2 26, BUN  13, creatinine 1.0, glucose 268.  Post procedure CK-MB  was negative.  BNP  137.  Total cholesterol 157, triglycerides 436, HDL 20, LDL not calculated  secondary to triglycerides greater than 400.   Chest x-ray with no evidence for edema and no focal consolidations or  pleural effusions.  Cardiomegaly is seen.   CONDITION ON DISCHARGE:  Improved.   DISCHARGE DIAGNOSES:  1. Acute coronary syndrome, status post percutaneous coronary intervention     of the saphenous vein graft to right coronary artery x2 this admission.  2. Status post aortocoronary bypass surgery in 1995, with saphenous vein     graft to posterior descending, saphenous vein graft to obtuse marginal-2     and left internal mammary artery to left anterior descending.  3. Status post percutaneous transluminal coronary angiography and stent of     the left anterior descending/posterior descending artery in 2002.  4. Nonsustained ventricular tachycardia, status post biventricular     implantable cardioverter-defibrillator in 2003.  5. Left bundle branch block.  6. History of congestive heart failure.  7. Ischemic cardiomyopathy with an ejection fraction of 30% by     catheterization this admission.  8. History of interstitial lung  disease with approximately 3 mm left upper     lobe nodule and left lower granuloma seen on previous chest x-ray.  9. Hyperlipidemia.  10.      Hypertension.  11.      Diabetes.  12.      Allergy to codeine.  13.      Gastroesophageal reflux disease.  14.      History of nephrolithiasis.  15.      Status post hernia repair and cervical laminectomy.   ACTIVITY:  No strenuous activity x1 week.   DIET:  Stick to a low fat, diabetic diet.   SPECIAL INSTRUCTIONS:  He is to call the office for problems with the  catheterization site.  He will be contacted by research for followup.   FOLLOW UP:  He is to follow up with Dr. [name] on April 20, at 3:30 p.m.  He is to follow up with his primary care physician as needed.   DISCHARGE MEDICATIONS:  1. Plavix 75 mg q.d.  2. Lasix 40 mg b.i.d.  3. Potassium 1/2 tablets b.i.d.  4. Aspirin 325 mg q.d.  5. Glucophage to be restarted on April 4.  6. Nitroglycerin p.r.n.  7. Glucotrol two tablets b.i.d.  8. Lotensin 10 mg q.d.  9. Digoxin 0.125 mg q.d.  10.      Toprol XL 50 mg q.d.  11.      Allegra 180 mg q.d.      Theodore Demark, P.A. [facility]                  Charlton Haws, M.D.    RB/MEDQ  D:  06/06/2003  T:  06/08/2003  Job:  [id number]   cc:   Charlies Constable, M.D. [facility]  [address]  [place]  [place] [address]  Fax: [phone]

## 2010-07-22 NOTE — Discharge Summary (Signed)
[place]. [facility]  Patient:    Corey [name], Corey [name] Visit Number: [id number] MRN: [id number]          Service Type: MED Location: [phone] Attending Physician:  Ronaldo Miyamoto Dictated by:   Rozell Searing, P.A. Admit Date:  05/30/2001 Discharge Date: 06/02/2001                    Referring Physician Discharge [place]  PROCEDURE:  Coronary angiogram.  REASON FOR ADMISSION:  Please refer to dictated admission note.  LABORATORY DATA:  WBC 5.3, HGB 14.1, HCT 40.8, platelet 188,000 on admission. Sodium 138, potassium 4.0, glucose 135, BUN 16, creatinine 1.0 on admission. CPK-MB negative x 3, troponin I markers negative x 6.  BNP 196.  HOSPITAL COURSE:  The patient ruled out for MI with negative serial cardiac enzymes.  Cardiac catheterization performed by Dr. Lewayne Bunting (see report for full details) revealed severe native three vessel CAD with patent graft and moderate LV dysfunction, EF 30-35%.  He also noted elevated LVEDP.  Specifically, coronary anatomy notable for patent LMCA, 100% LAD, 100% circumflex, high grade distal RCA, patent LIMA/LAD, patent SVG/OM2, patent SVG/PDA and PL with patent PDA/PCI site.  Dr. Lewayne Bunting recommending checking a BNP level (196) and treatment of congestive heart failure.  The patient had clinical improvement on intravenous Lasix with a loss of approximately three pounds.  He was cleared for discharge on hospital day three in hemodynamically stable condition and no signs or symptoms of congestive heart failure.  Medication adjustments this admission is the initiation of Cozaar 25 mg q.d. and discontinuation of Norvasc.  DISCHARGE MEDICATIONS:  1. Toprol XL 25 mg q.d.  2. Lanoxin 0.125 mg q.d.  3. Coated aspirin 325 mg q.d.  4. Glucophage 500 mg b.i.d.  5. Plavix 75 mg q.d.  6. Advair 150/500 mg q.d.  7. Singulair 10 mg q.d.  8. Chloroquine 40 mEq q.d.  9. Lasix 40 mg b.i.d. 10. Cozaar 25 mg  q.d.  DISCHARGE INSTRUCTIONS:  Stop taking Norvasc.  Keep wound site clean and dry. Call the office if there is any bleeding or swelling of the groin.  No strenuous activity or heavy lifting x 2 to 3 days.  The patient is instructed to call and schedule a follow-up appointment with Dr. Arturo Morton. Stuckey in approximately two weeks.  DISCHARGE DIAGNOSES: 1. Compensated congestive heart failure. 2. Ischemic cardiomyopathy.    a. Ejection fraction 30-35% by coronary angiography.    b. Patent graft with severe native coronary artery disease.    c. Four vessel coronary artery bypass graft in 1995.    d. Status post multiple prior percutaneous interventions with patent       posterior descending artery at percutaneous intervention site. 3. Type 2 diabetes mellitus. 4. Hypertension. 5. History of dyslipidemia. Dictated by:   Rozell Searing, P.A. Attending Physician:  Ronaldo Miyamoto DD:  06/02/01 TD:  06/02/01 Job: [id number]

## 2010-07-22 NOTE — Discharge Summary (Signed)
NAME:  Corey [name], Corey [name] NO.:  [id number]   MEDICAL RECORD NO.:  [id number]          PATIENT TYPE:  INP   LOCATION:  3712                         FACILITY:  [facility]   PHYSICIAN:  Corey Abed, MD, FACCDATE OF BIRTH:  17-Oct-1935   DATE OF ADMISSION:  07/10/2006  DATE OF DISCHARGE:  07/14/2006                               DISCHARGE SUMMARY   PRIMARY CARDIOLOGY:  Dr. Shawnie Pons.   ELECTROPHYSIOLOGIST:  Dr. Gilman Schmidt.   PRIMARY CARE [place]ene Sweeting:  Dr. Delano Metz.   PRINCIPAL DIAGNOSIS:  Recurrent atrial fibrillation.   SECONDARY DIAGNOSES:  1. Ischemic cardiomyopathy, EF 35-40%.  2. History of coronary artery disease.      a.     Status post redo bypass surgery April 2006.      b.     Mitral regurgitation status post mitral valve repair at the       time of redo bypass surgery April 2006.  3. Chronic obstructive pulmonary disease.  4. Hypertension.  5. Hyperlipidemia.  6. A 40-60% left internal carotid artery stenosis.  7. Status post biventricular implantable cardioverter/defibrillator      May 2007.   ALLERGIES:  CODEINE.   PROCEDURES:  Successful direct current cardioversion.   HISTORY OF PRESENT ILLNESS:  A 75 year old male with prior history of  CAD, ischemic cardiomyopathy, and chronic systolic CHF who recently saw  Dr. [name] in clinic on Jul 05, 2006, secondary to recurrent atrial  fibrillation with worsening of heart failure symptoms.  Decision was  made to admit the patient electively for Tikosyn loading and subsequent  cardioversion if necessary.   HOSPITAL COURSE:  [name] presented to [facility] on Jul 10, 2006, for elective admission and Tikosyn loading.  After calculation of  QTc along with creatinine clearance, he was initiated on Tikosyn 500 mcg  q.12 h.  Baseline QTc in the setting of a paced rhythm was 550 msec.  He  tolerated Tikosyn loading well however, did not convert to sinus rhythm  and therefore, on Jul 13, 2006, he underwent successful cardioversion  performed by Dr. [name] with restoration of sinus rhythm.  The patient  has maintained sinus rhythm overnight and has been ambulating without  difficulty or recurrent dyspnea.  He is being discharged home today in  satisfactory condition.   DISCHARGE LABORATORIES:  Hemoglobin 12.3, hematocrit 36.2, WBC 5.6,  platelets 161.  PT 25.1, INR 2.2.  Sodium 139, potassium 3.8, chloride  101, CO2 29, BUN 16, creatinine 1.07, glucose 88, calcium 8.9.   DISPOSITION:  The patient is being discharged home today in good  condition.   FOLLOW-UP PLANS AND APPOINTMENTS:  We will arrange for followup with Dr.  [name] in approximately 2 weeks.  He will maintain [facility]  followup as previously scheduled.  He is asked to follow up with his  primary care Hillis Mcphatter, Dr. Laban Emperor as previously scheduled.   DISCHARGE MEDICATIONS:  1. Coumadin as previously prescribed.  2. Lotensin 10 mg daily.  3. Digoxin 0.125 mg daily.  4. Toprol XL 50 mg daily.  5. Lasix  40 mg b.i.d.  6. Aspirin 81 mg daily.  7. Glucophage 500 mg b.i.d.  8. Amaryl 2 mg daily.  9. Klor-Con 20 mEq daily.  10.Halcion 0.25 mg at bedtime.  11.Tikosyn 500 mcg q.12 h.   Of note, the patient's Actos therapy was discontinued secondary to  episodic hypoglycemia with fasting sugars in the mid to low 60s and 70s.   OUTSTANDING LABORATORY STUDIES:  None.   DURATION DISCHARGE ENCOUNTER:  Thirty-three minutes including physician  time.      Nicolasa Ducking, ANP      Corey Abed, MD, [facility]  Electronically Signed    CB/MEDQ  D:  07/14/2006  T:  07/15/2006  Job:  [id number]   cc:   Abel Presto

## 2010-07-22 NOTE — Cardiovascular Report (Signed)
NAMEALANSON, HAUSMANN                 ACCOUNT NO.:  [id number]   MEDICAL RECORD NO.:  [id number]          PATIENT TYPE:  INP   LOCATION:  2920                         FACILITY:  [facility]   PHYSICIAN:  Arturo Morton. [name], M.D. [facility] OF BIRTH:  1936/01/01   DATE OF PROCEDURE:  06/09/2004  DATE OF DISCHARGE:                              CARDIAC CATHETERIZATION   INDICATIONS:  [name] is a pleasant 75 year old gentleman who presents  with some recurrent symptoms.  He has had some burning in the throat which  is his typical angina.  The patient has had a complicated prior history.  In  1987 he had a anterior wall infarction.  He subsequently had percutaneous  interventions.  1995 he had revascularization surgery.  A year ago he had  two stents placed in the vein graft to the right.  He has had a  biventricular ICD placed for heart failure symptoms.  He clinically is  improved, but recently has developed some throat burning again.  He was  brought back to the catheterization laboratory for further evaluation and  treatment.   PROCEDURE:  1.  Right and left heart catheterization.  2.  Selective coronary arteriography.  3.  Saphenous vein graft angiography x2.  4.  Selective left internal mammary angiography x1.  5.  Selective left ventriculography.   DESCRIPTION OF THE PROCEDURE:  The patient brought to the catheterization  laboratory and prepped and draped in the usual fashion.  There was a fair  amount of scar tissue in the right femoral area but we were able to gain  access to the femoral artery and a 6-French sheath was placed.  Following  this views of the left and right coronary arteries were obtained in multiple  angiographic projections.  Vein graft angiography was performed.  Internal  mammary angiography was performed with an RCA catheter, but then repeated  with an IMA catheter to get a better injection actually into the vessel  itself.  Central aortic and left ventricle  pressures were measured with a  pigtail.  Ventriculography was performed in the RAO projection.  Following  this, I reviewed the films with Dr. Juanda Chance.  I elected to go ahead and do a  right heart catheterization to better assess his suitability for possible  surgery.  There was evidence of degenerative vein graft disease.  We will  obtain a surgical consult at this point in time.   HEMODYNAMIC DATA:  1.  Right atrium 14.  2.  Right ventricle 65/50.  3.  Pulmonary artery 55/29, mean 40.  4.  Pulmonary capillary wedge mean 36 with V-waves up to 50.  5.  Aortic 137/84.  6.  LV 123/30.  7.  No gradient on pullback across the aortic valve.  8.  Fick cardiac output 3.9 L/minute.  9.  Thermodilution cardiac output 4.9 L/minute.  10. Fick cardiac index 1.8 L/minute/sq m.  11. Thermodilution cardiac index 2.3 L/minute/sq m.  12. Aortic saturation 97%.  13. Pulmonary saturation 66%.   ANGIOGRAPHIC DATA:  1.  Ventriculography was formed in the RAO projection.  There  appeared to be      at least moderate mitral regurgitation.  The anterobasal and inferobasal      segments appeared to move well.  The distal anterolateral apical segment      appeared to be severely hypokinetic and this is chronic  2.  The left main is free of critical disease.  3.  The LAD is heavily calcified and essentially has an 80% stenosis.  The      distal LAD is totally occluded and fills by the internal mammary.  The      internal mammary to the distal LAD fills somewhat slowly, but appears to      be angiographically intact.  4.  There is a ramus intermedius.  The ramus intermedius has about 50-60%      mid narrowing.  The small second marginal is fairly small in caliber and      without critical narrowing.  5.  The right coronary now is subtotally occluded and then totally occluded      just distally with hang-up of contrast compatible with thrombus.  This      is new from the previous study.  6.  The vein graft  that goes to the marginal system demonstrates diffuse      degenerative change throughout the whole proximal vessel.  There is      probably a 70-80% stenosis just inside the ostium.  It supplies a      marginal branch which supplies viable myocardium  7.  The vein graft to the distal right also is intact with the stent sites      patent.  Nonetheless, there is diffuse degenerative change throughout      this vein graft and inserts into the posterior descending and      posterolateral system.   CONCLUSION:  1.  Moderate reduction in global left ventricular function with evidence of      mitral regurgitation and reduced overall left ventricular function with      an estimate of the ejection fraction in the 40-45% range.  2.  Moderately severe mitral regurgitation.  3.  Pulmonary hypertension possibly secondary to #2.  4.  Progressive degenerative vein graft changes as noted in the above text.  5.  Significant diagonal disease, but continued patency with previous      stenting of this vessel.   DISPOSITION:  This is a difficult situation.  There is not one particular  area to spot stent in the vein grafts.  There is diffuse degenerative change  suggesting that the vein grafts will not last an indefinite period.  We will  get a surgical consult.  He will likely need a TEE study after diuresis to  get a better handle on the severity of MR and whether or not he is a  potential surgical candidate.  I will get a surgical consult and we will  evaluate the situation together.      TDS/MEDQ  D:  06/09/2004  T:  06/09/2004  Job:  [id number]   cc:   CV Lab

## 2010-07-22 NOTE — Consult Note (Signed)
NAME:  Corey [name], Corey [name]                 ACCOUNT NO.:  [id number]   MEDICAL RECORD NO.:  [id number]          PATIENT TYPE:  INP   LOCATION:  3035                         FACILITY:  [facility]   PHYSICIAN:  Doralee Albino. Carola Frost, M.D. DATE OF BIRTH:  1935/08/26   DATE OF CONSULTATION:  07/14/2004  DATE OF DISCHARGE:                                   CONSULTATION   REASON FOR CONSULTATION:  Right knee and bilateral ankle pain.   REQUESTING SURGEON:  Dr. Garen Grams   HISTORY OF PRESENT ILLNESS:  The patient is a 75 year old white male with  prior history of redo CABG with progressive right knee and bilateral ankle  pain.  Apparently, his pain was initially in his left groin but since that  time, has migrated to these other areas and the pain in his groin has  resolved.  This has reduced his ability to ambulate.  He reports the pain as  severe and alleviated by motion and weight bearing and alleviated with rest  and lack of activity.  This has been associated with mild low grade fever  without chills.  His appetite has been decreased.  He denies significant  nausea and vomiting.  He does report weakness which is associated with the  pain directly.   PAST MEDICAL HISTORY:  He had recent cardiac surgery on June 13, 2004.  He  had a valve repair without valve replacement.  Additional history of  hypertension, hyperlipidemia, non insulin dependent diabetes mellitus,  inducible ventricular tachycardia with placement of a pacemaker and  defibrillator.  He has moderate COPD, history of left internal carotid  artery stenosis, history of cervical laminectomy and inguinal hernia repair.   PAST SURGICAL HISTORY:  As above.   MEDICATIONS:  Aspirin 81 mg daily, Toprol XL 25 mg daily, Lanoxin 0.125 mg  daily, Coumadin, Actos, Lasix, and Allegra.   ALLERGIES:  Codeine.   FAMILY HISTORY:  The patient's family history is notable for hypertension.   SOCIAL HISTORY:  The patient lives in his own home.  He is  accompanied by  his son today.  He denies cigarettes and alcohol.   REVIEW OF SYMPTOMS:  Notable for, again, recent low grade fever and no  chills.  He denies back pain.  Please refer to the history of present  illness for additional factors.   PHYSICAL EXAMINATION:  He appears to be in moderate pain.  He does not appear ill.  His hips are  nontender with direct palpation of the trochanters on the right and left  side.  His right knee is  notable for a 3+ effusion with pain elicited  during range of motion.  He can extend to within 5 degrees of full extension  and can flex to about 30 degrees only.  He does have warmth of the knee but  it is not significantly elevated.  There is no erythema.  He does have a  medial scar which extends from the knee down the leg into the ankle from his  saphenous harvest which was remote on this side.  He has redness along the  lateral aspect of the heel and posteriorly along his peroneal tendons.  He  does have slight fullness and tenderness along the ankle joint line.  He  does not have any focal bony tenderness.  He has 1+ pitting edema, dorsalis  pedes pulses 2+ and easily palpable.  He has painful range of motion of the  ankle.  Posterior tib pulses 2+, as well.  The erythema does not extend  proximally along the tendons.  On the contralateral extremity, again, there  was no hip pain.  The left knee was notable for the absence of effusion.  He  had near full range of motion of the knee on the left.  He did not have any  varus, valgus, anterior or posterior instability.  No focal tenderness.  He  also had a saphenous harvest scar which did not have any significant  associated erythema.  There was some mild irregularity of his scar on the  medial aspect of the knee but no drainage or palpable fullness.  The ankle  was notable for medial and posteromedial swelling and slight erythema,  tenderness along his posterior tib tendons right at the malleolus but  this  did not extend proximally along the tendon sheath.  Dorsalis pedes pulse was  2+ as was the posterior tib pulse.  He had palpable fullness and tenderness  along the joint line, as well, but this was less so than the right side.  On  both sides, deep peroneal, superficial peroneal, and tibial nerve sensory  and motor function was intact.  Of note, the patient was also able to  undergo a straight leg raise of the left lower extremity up to 50 degrees  which did not increase the pain in his foot.  We were unable to perform a  straight leg raise on the right secondary to pain.   LABORATORY DATA:  White count 9, INR 3, elevated PTT, as well.  There was a  mild left shift of 80% segs associated with his white count.  X-rays were  reviewed which consisted of a chest x-ray which appeared grossly negative  for acute cardiopulmonary disease.  Pacemaker was present.  The spine films  showed some degenerative disc and facet changes without significant  listhesis.  X-rays of both ankles as well as the right knee are pending at  the time of evaluation.   ASSESSMENT:  Suspected inflammatory arthropathy of the right knee and both  ankle joints.  The differential, however, includes possible septic arthritis  of these joints.   MANAGEMENT AND PLAN:  After discussion of the patient's findings, laboratory  results, and physical exam findings, we recommended aspiration of the right  knee and both ankles.  The patient did wish to proceed with this and this  was performed using sterile technique.  45 mL of clear synovial fluid were  aspirated from the right knee.  The appearance of the fluid was suspicious  for crystals.  The fluid was also clear on the right and left ankles.  Only  2 mL were aspirated from each ankle joint.  Again, there was no suggestion  of gross infection.  The left ankle tap did have some blood mixed in prior  to obtaining the specimen.  At this point, we recommend empiric  therapy for presumed gout given the  possible tophaceous deposit in the aspirate from the right knee.  Cultures  should be followed to rule out infection, although again, this is not  likely.  He  can resume weight-bearing as tolerated bilaterally as his pain  improves.  Will continue to follow the patient.      MHH/MEDQ  D:  07/14/2004  T:  07/14/2004  Job:  [id number]

## 2010-07-22 NOTE — Op Note (Signed)
Corey [name], PAULDING                 ACCOUNT NO.:  [id number]   MEDICAL RECORD NO.:  [id number]          PATIENT TYPE:  OIB   LOCATION:  2899                         FACILITY:  [facility]   PHYSICIAN:  [name]. [name], MD    DATE OF BIRTH:  30-Oct-1935   DATE OF PROCEDURE:  05/15/2006  DATE OF DISCHARGE:  05/15/2006                               OPERATIVE REPORT   PROCEDURE PERFORMED:  Direct current cardioversion.   INDICATIONS:  Symptomatic atrial fibrillation.   The patient is a 75 year old male with an ischemic cardiomyopathy and  atrial fibrillation with congestive heart failure, who is status post  BiV ICD insertion secondary to VT in the setting of an ischemic  cardiomyopathy.  The patient has had worsening heart failure symptoms  and was found to be in atrial fibrillation and is now referred for DC  cardioversion.   PROCEDURE:  After informed consent was obtained and the patient was  sedated under the direction of Dr. Ivin Booty with Anesthesia Service  utilizing etomidate, 200 joules of synchronized biphasic energy was  delivered through the electrode dispersive pads in the anterior  posterior position, resulting in a restoration of sinus rhythm.  The  patient tolerated the procedure well.  There were no immediate  complications.      [name]. [name], MD  Electronically Signed     GWT/MEDQ  D:  05/15/2006  T:  05/17/2006  Job:  [id number]

## 2010-07-22 NOTE — Cardiovascular Report (Signed)
[place]. [facility]  Patient:    Corey [name], Corey [name]                       MRN: [id number] Proc. Date: 03/27/00 Attending:  Arturo Morton. [name], M.D. [facility] CC:         Carron Curie, M.D., [facility]  CV Laboratory   Cardiac Catheterization  INDICATIONS:  Mr. Heppler is well known to me.  He is a 75 year old who had an anterior wall MI in 1987.  In 1995 he underwent revascularization surgery.  He has adult dysmetabolic syndrome.  Recently, he has had some shortness of breath and he has had moderate known LV dysfunction.  However, symptoms have been somewhat progressive and because of this he was brought back in for further evaluation.  PROCEDURES: 1. Left heart catheterization. 2. Selective coronary arteriography. 3. Selective left ventriculography. 4. Saphenous vein graft angiography x 2. 5. Selective internal mammary angiography x 1.  DESCRIPTION OF PROCEDURE:  The procedure was performed from the right femoral artery using 6 French catheters.  He tolerated the procedure without complication.  He was taken off the table and placed in the holding area.  HEMODYNAMICS:  The central aortic pressure was 134/28.  LV pressure 139/83. There was no gradient on pullback across the aortic valve.  ANGIOGRAPHIC DATA:  The left main coronary artery is free of critical disease.  The LAD is subtotal occluded beyond the diagonal.  The diagonal itself has been previously intervened upon.  In this mid area, there is a long stent that has about 50% smooth narrowing just proximal to the stent and just after a septal perforator, there is a focal 90% stenosis leading into the stent.  The left internal mammary to the distal LAD appears to be intact.  There may be a small amount of ostial irregularity of the IMA, but this does not appear to be flow-limiting.  The circumflex demonstrates about a 30% proximal area of narrowing and the large intermediate vessel.  The  native circumflex is subtotally occluded beyond the small marginal branch.  The saphenous vein graft to the large marginal branch is widely patent.  There is some mild irregularity distally.  The right coronary artery has a 50% proximal stenosis and is irregular throughout its course.  There is subtotal eccentric 95% stenosis and competitive filling distally.  The saphenous vein graft to the posterior descending and posterolateral branches is widely patent.  There is some mild irregularity.  There is about an 80% stenosis in the PDA beyond the graft insertion.  VENTRICULOGRAPHY:  Ventriculography in the RAO projection reveals anteroapical akinesis.  Ejection fraction is 41%.  The high anterolateral wall and inferobasal wall move reasonably well.  There is perhaps very mild mitral regurgitation as well.  CONCLUSIONS: 1. Continued patency of the internal mammary to the left anterior descending,    patency of the saphenous vein graft to the obtuse marginal, and patency    of the saphenous vein graft to the posterior descending and posterolateral    artery. 2. An 80% posterior descending artery stenosis after the graft insertion. 3. Patent stent site to the diagonal with 90% narrowing prior to the stent    site with approximately 40-50% diffuse stent narrowing which does not    appear to be flow-limiting. 4. Moderate reduction in left ventricular function with elevated left    ventricular end-diastolic pressure.  DISPOSITION:  We will likely plan a two-vessel percutaneous coronary intervention.  This will be done electively with the patient pretreated with Plavix. DD:  03/27/00 TD:  03/27/00 Job: [id number]

## 2010-07-22 NOTE — Cardiovascular Report (Signed)
NAME:  NELLIE, CHEVALIER                 ACCOUNT NO.:  [id number]   MEDICAL RECORD NO.:  [id number]           PATIENT TYPE:   LOCATION:                                 FACILITY:   PHYSICIAN:  Arturo Morton. [name], MD, FACCDATE OF BIRTH:  [birth year]   DATE OF PROCEDURE:  DATE OF DISCHARGE:                            CARDIAC CATHETERIZATION   INDICATIONS:  Mr. Urquilla is well known to me.  I have known him for many  years.  He has previously undergone revascularization surgery.  He has  had multiple percutaneous interventions.  The current study was done to  assess his coronary anatomy.   PROCEDURES:  1. Selective coronary arteriography.  2. Saphenous vein graft angiography.  3. Selective left internal mammary angiography.   DESCRIPTION OF THE PROCEDURE:  The procedure was performed from the  femoral artery without difficulty.  We elected not to perform  ventriculography to limit contrast load.  There were no major  complications and was taken to the holding area in satisfactory clinical  condition.   HEMODYNAMIC DATA:  The central aortic pressure was 152/79, mean 107.   ANGIOGRAPHIC DATA:  1. On plain fluoroscopy, there was extensive calcification of coronary      arteries.  2. The left main is extensively calcified and diffusely diseased.  It      is extensively diseased, but there was not a focal stenosis noted.  3. The LAD has been previously stented leading into the diagonal      branch for flow.  This is subtotally occluded with very slow flow,      with a small stent in a calcified vessel.  There was very limited      flow down the LAD.  The circumflex was essentially occluded leading      into the marginal.  4. The right coronary artery is totally occluded proximally.  5. There is a saphenous vein graft sequentially to two branches of the      circumflex.  The saphenous vein graft itself is really quite      smooth.  There is a large marginal and a posterolateral and  marginal branch that is widely patent.  There is filling of the      distal vessel likely a posterolateral branch through      collateralization distally.  6. The previous graft to the OM system is occluded.  7. The saphenous vein graft to the right coronary demonstrates about      60% ostial narrowing and then a focal 75% lesion just adjacent to a      marker on the proximal aspect of a previous stent.  More distally,      there is about 40-60% area of narrowing at the distal end of the      stent, and then there is more 40% narrowing more distally prior to      its insertion into the PDA.  8. The internal mammary remains patent to a diffusely irregular LAD,      which then faintly fills what looks like a diagonal retrograde.  CONCLUSIONS:  1. Continued patency of the left internal mammary to the distal LAD.  2. Continued patency of the saphenous vein graft to two branches of      the marginal with collateralization of part of the distal right      circulation.  3. Old saphenous vein graft to the distal PDA with multiple prior      stents with a new high-grade in-stent restenosis proximally, and an      ostial disease.   DISPOSITION:  We will monitor the patient's renal function carefully.  We will bring him back for percutaneous intervention.      Arturo Morton. [name], MD, [facility]  Electronically Signed     TDS/MEDQ  D:  06/25/2008  T:  06/25/2008  Job:  [id number]

## 2010-07-22 NOTE — Cardiovascular Report (Signed)
[place]. [facility]  Patient:    Corey [name], Corey [name] Visit Number: [id number] MRN: [id number]          Service Type: MED Location: [id number] Attending Physician:  Ronaldo Miyamoto Dictated by:   Lewayne Bunting, M.D. [facility] Proc. Date: 05/31/01 Admit Date:  05/30/2001                          Cardiac Catheterization  DATE OF BIRTH: 07-14-1935  PROCEDURES PERFORMED: 1. Left heart catheterization with selective coronary angiography. 2. Ventriculography. 3. Graft assessment.  DIAGNOSES: 1. Markedly elevated left ventricular end-diastolic pressure consistent    with volume overload (congestive heart failure). 2. Mitral regurgitation. 3. Severe native multivessel coronary artery disease. 4. Patent graft anatomy.  INDICATIONS: The patient is a 75 year old white male with multiple cardiovascular risk factors. The patient has known coronary artery disease and is status post coronary artery bypass grafting. The patient, in the past, received a saphenous vein graft to the right coronary artery (sequential graft to the PDA and PLA), as well as a saphenous vein graft second obtuse marginal branch. In addition, he also has a LIMA graft to the LAD. The patient is well known to Dr. [name], who performed a cardiac catheterization a year ago. The patient, at that time, was found to have significant disease in the native proximal LAD and underwent PCI. In addition, there was a high-grade stenosis in the PDA just beyond the graft anastomosis and this was also intervened on. The patient also has known LV systolic dysfunction. He is now being admitted with increased symptoms of shortness of breath and some substernal chest pressure. Dr. [name] has recommended cardiac catheterization to assess his graft anatomy and LV function.  DESCRIPTION OF PROCEDURE: After informed consent was obtained, the patient was brought to the catheterization laboratory.  The right  groin was sterilely prepped and draped.  Lidocaine 1% was injected. A 6 French arterial sheath was placed using modified Seldinger technique. Subsequently, a JL4 and JR4 catheters were used to assess the native left and right coronary circulation, respectively. The saphenous vein graft to the marginal branch was injected with a JR4 catheter. The saphenous vein graft to the PDA and PLA was cannulated with a right coronary bypass catheter. The left internal mammary artery was selective cannulated with a LIMA catheter. Selective coronary angiography was performed in various injections using manual injection of contrast. Following this, a 6 French angled pigtail catheter was placed in the left ventricular cavity. Appropriate left-sided hemodynamics were obtained. Ventriculography was then performed in the single plane RAO projection using power injection of contrast. At the termination of the procedure, all catheters and sheath were removed and the patient was brought back to the holding area.  FINDINGS: 1. Hemodynamics: Left ventricular pressure 106/28 mmHg, aortic    pressure 106/65 mmHg.  There was no gradient on aortic pullback. 2. Ventriculography: Ejection fraction 30-35% with multiple segmental    wall motion abnormalities. There is an area of hypokinesis in the    anterior wall extending from the mid ventricle to the apex. The apex    and the inferior wall are akinetic. There is a significant mitral    regurgitation estimated 2-3+.  SELECTIVE CORONARY ANGIOGRAPHY: 1. Native coronary arteries: The left main coronary is a large caliber vessel. 2. The left anterior descending artery is occluded past the first    diagonal branch. The proximal LAD demonstrates irregularities but  no    focal stenosis. A stent is visualized in the first diagonal branch    without significant in-stent re-stenosis. 3. Ramus is a moderate sized vessel extending to the apex with moderate    disease in its  proximal segment, but no significant focal stenoses. 4. The circumflex coronary artery is occluded after the first obtuse marginal    branch. The first obtuse marginal branch has approximately 50%    stenosis in its proximal segment. 5. Right coronary artery is a large caliber vessel with approximately 50%    stenosis in the proximal segment. Following this, there is a moderate sized    RV branch which has a high-grade 90% stenosis at the origin. The distal    right coronary artery has a high-grade stenosis of 95%. Distal filling of    the PLA is noted.  GRAFT ANATOMY: 1. Left internal mammary artery to the LAD is widely patent. 2. Saphenous vein graft to the second obtuse marginal branch is    patent. There is retrograde filling of the circumflex proper in the    proximal obtuse marginal branch, however, has severe diffuse disease. 3. The saphenous vein graft to the right coronary artery has approximately    20% stenosis in the proximal portion and a 40% stenosis in the more distal    segment. Otherwise, the graft is widely patent with good distal filling    of two posterolateral branches in the PDA. A stent is visualized in the PDA    which demonstrates no re-stenosis  RECOMMENDATIONS: Angiographic images were reviewed with Dr. [name]. There is no obvious areas of ischemia. Both the PCI site at the LAD just prior to the first diagonal and the PCI site of the PDA beyond the graft anastomosis, showed no evidence of significant re-stenosis It appears that the patients symptoms are predominately heart failure symptoms. During the procedure, the patient did complain of some shortness of breath and towards the end of the procedure had significant coughing. A BMP level has been ordered as well as orders for Lasix have been written. Recommendation is to diurese the patient during the remainder of his hospital course and add either an ACE inhibitor or angiotensin ______ blocker. Dictated by:    Lewayne Bunting, M.D. [facility] Attending Physician:  Ronaldo Miyamoto DD:  05/31/01 TD:  06/01/01 Job: [id number]

## 2010-07-22 NOTE — Discharge Summary (Signed)
[place]. [facility]  Patient:    Corey [name], Corey [name]                        MRN: [id number] Adm. Date:  [id number] Disc. Date: [id number] Attending:  Ronaldo Miyamoto Dictator:   Tereso Newcomer, P.A.-C. CC:         Carron Curie, M.D.   Discharge Summary  DISCHARGE DIAGNOSES:  1. Coronary artery disease.  2. Status post coronary artery bypass grafting by Dr. Tyrone Sage,     November 1995:     a. Left internal mammary artery to left anterior descending.     b. Saphenous vein graft to obtuse marginal.     c. Saphenous vein graft to posterior descending and posterior        arteries (PA).  3. Status post percutaneous transluminal coronary angioplasty of     the left anterior descending leading to the diagonal,     March 30, 2000.  4. Status post stent to posterior descending after graft insertion,     March 30, 2000.  5. Left ventricular dysfunction, ejection fraction 41%.  6. Hypertension.  7. Diabetes mellitus type 2.  8. Hyperlipidemia.  9. History of stent to the left anterior descending in 1999. 10. Cardiolite, November 2000; with anteroapical and probable inferior     infarct, but no significant ischemia.  ADMISSION HISTORY:  Corey [name] was seen in the office on March 23, 2000 by Dr. Antoine Poche and Dian Queen, P.A.C.  The patient noted a couple months worth of exertional dyspnea and occasional tightening in the neck.  He had no crescendo symptoms.  He did have some orthopnea over the last few nights prior to initial evaluation in the office, but this had been much improved.  ALLERGIES:  No known drug allergies.  PHYSICAL EXAMINATION:  GENERAL:  Initial physical examination revealed a pleasant gentleman.  VITAL SIGNS:  Blood pressure 130/80.  NECK:  Without JVD.  HEART:  Sinus rhythm; S1, S2 normal, S4 is present.  No significant murmur.  EXTREMITIES:  Without edema.  LUNGS:  Clear to auscultation and percussion  bilaterally.  ABDOMEN:  Soft without masses or hepatosplenomegaly.  HOSPITAL COURSE:  The patient was brought in as an outpatient on March 27, 2000 for cardiac catheterization.  This revealed an EF of 41%, anteroapical akinesis, 1+ MR.  The patient also had 80% stenosis in the PDA after graft insertion.  There was a patent stent to the diagonal and there was an 80% lesion before this stent.  The patient was allowed to go home on a leave of absence.  He returned on Friday, March 30, 2000 for percutaneous coronary intervention.  He underwent successful PTCA of the LAD leading to the diagonal and stent placement to the PDA after graft insertion.  He tolerated the procedure well and on the morning of March 31, 2000 he was found to be in stable condition.  His right groin was without hematoma or bruits.  Cardiac enzymes were normal.  Creatinine was 1.1, and platelet count was 174,000.  Therefore, it was felt he was stable enough for discharge to home.  DISCHARGE MEDICATIONS:  1. Plavix 75 mg q.d. for one month.  2. Lotensin 10 mg q.d.  3. Toprol XL 325 mg q.d.  4. Coated aspirin 325 mg q.d.  5. Lanoxin 0.125 mg q.d.  6. Norvasc 5 mg q.d.  7. Glucophage 500 mg b.i.d. (This is to be held until  Monday,     April 02, 2000).  8. Lasix 60 mg q.d.  9. K-Dur 10 mEq q.d. 10. Nitroglycerin 0.4 mg subdermal p.r.n. chest pain.  ACTIVITY:  No driving, heavy lifting, exertions, or sex for three days.  DIET:  Low-fat, low-sodium diabetic diet.  WOUND CARE:  The patient is to watch his groin for any increased swelling, bleeding or bruising to the office with concerns.  SPECIAL INSTRUCTIONS:  As noted above, he is to refrain from taking his Glucophage until Monday, April 02, 2000.  He is to follow up with Dr. [name] or the PA in two weeks.  He needs to call our office for an appointment. DD:  03/31/00 TD:  03/31/00 Job: [id number]

## 2010-07-22 NOTE — Assessment & Plan Note (Signed)
[facility]                         ELECTROPHYSIOLOGY OFFICE NOTE   PRENTIS, LANGDON                        MRN:          [id number]  DATE:03/27/2006                            DOB:          Jun 01, 1935    HISTORY OF PRESENT ILLNESS:  [name] returns today for follow up.  He  is a very pleasant man with a history of an ischemic cardiomyopathy and  congestive heart failure who underwent Bi-V ICD implantation several  years ago with much improvement in his symptoms.  The patient returns  today for follow up.  He is otherwise stable but noticed that over the  last several months he has had increasing worse exercise tolerance.  He  was seen by Dr. Tedra Senegal back in December and found to be out of rhythm in  what appears to be a course of atrial fibrillation, although, I cannot  rule out an atypical flutter.  Prior EKG's dating back to September  demonstrated sinus rhythm with pre-synchronous biventricular pacing.  The patient has had no syncope and denies any intercurrent IC therapies.   PHYSICAL EXAMINATION:  GENERAL:  He is a pleasant, middle-aged man in no  acute distress.  VITAL SIGNS:  Blood pressure 138/84, pulse 68 and irregular,  respirations 18, weight 219 pounds.  NECK:  No JVD.  LUNGS:  Rales in the bases, but otherwise, clear.  No increased work of  breathing.  There were no wheezes or rhonchi.  CARDIOVASCULAR:  Regular rate and rhythm with split S2.  EXTREMITIES:  Trace to 1+ peripheral edema.   CLINICAL COURSE:  Interrogation of his defibrillator demonstrates a  Guidant Contact H175.  Fibrillation waves measured 1.6 millivolts, R  wave 17.  The impedence 568 in the atrium, 651 in the right ventricle,  680 in the left ventricle.  Threshold was 0.4 and 0.5 in the right  ventricle and 2.6 and 0.8 in the left ventricle.  Battery voltage is 3.2  volts.   IMPRESSION:  1. Ischemic cardiomyopathy.  2. Congestive heart failure.  3. Recent  diagnosis of atrial fibrillation/flutter.  4. Coumadin therapy secondary to #3.   DISCUSSION:  Mr. Bubb is clearly more symptomatic in his atrial  fibrillation, and I have recommended that we proceed with DC  Cardioversion.  I think the likelihood that his atrial arrhythmia is  easily ablatable, i.e. right atrial flutter is pretty unlikely at this  point.  If he cannot  maintain sinus rhythm on Cardioversion, then an antiarrhythmic drug  would be a consideration.  Finally, the patient complains of erectile dysfunction and has asked for  treatment for this, and I have recommended that we first try to get him  back into rhythm before any other treatments.     [name]. [name], MD  Electronically Signed    GWT/MedQ  DD: 03/27/2006  DT: 03/27/2006  Job #: [id number]

## 2010-07-22 NOTE — Op Note (Signed)
NAME:  EHREN, BERISHA                           ACCOUNT NO.:  [id number]   MEDICAL RECORD NO.:  [id number]                   PATIENT TYPE:  OIB   LOCATION:  3729                                 FACILITY:  [facility]   PHYSICIAN:  [name]. [name], M.D. [facility]           DATE OF BIRTH:  [birth year]   DATE OF PROCEDURE:  12/11/2001  DATE OF DISCHARGE:                                 OPERATIVE REPORT   PROCEDURE PERFORMED:  Biventricular ICD insertion.   INDICATIONS FOR PROCEDURE:  Inducible monomorphic ventricular tachycardia,  ischemic cardiomyopathy and left bundle branch block with class 3 congestive  heart failure.   INTRODUCTION:  The patient is a very pleasant 75 year old male with ischemic  heart disease, status post myocardial infarction, status post bypass  surgery, who has a history of congestive heart failure.  He also has a  history of nonsustained ventricular tachycardia and underwent  electrophysiology study demonstrating inducible monomorphic ventricular  tachycardia and he is now referred for Bi V ICD insertion.   DESCRIPTION OF PROCEDURE:  After informed consent was obtained, the patient  was taken to the diagnostic electrophysiology laboratory in fasted state.  After the usual preparation and draping, intravenous fentanyl and Valium was  given for sedation.  A total of 30 cc of lidocaine was infiltrated into the  left infraclavicular region.  An 11 cm incision was carried out over this  region and electrocautery utilized to dissect down to the subpectoralis  fascia.  10 cc of contrast demonstrated a patent left subclavian vein.  It  was subsequently punctured times three and the Guidant model [id number]  defibrillation lead was advanced into the right ventricle.  The Guidant  model [id number] dual chamber pacing lead, bipolar pacing lead was inserted  into the right atrium and the Guidant guiding catheter was advanced by way  of a 9-1/2 inch hemostatic sheath into the  right atrium.  The hexapolar  coronary sinus catheter was advanced through the Guidant catheter and into  the coronary sinus.  The coronary sinus venography was carried out in the  RAO and LAO projections.  This demonstrated one acceptable vein.  Utilizing  a Rapido Guiding Catheter, the guidewire was advanced into the lateral  cardiac vein.  The Guidant LV lead [id number] LV pacing lead was advanced  into the lateral vein branching off the coronary sinus.  Out near the distal  portion of the lateral vein, the patient had diaphragmatic stimulation at 5  V.  The vein was withdrawn and a separate vein was cannulated, a separate  branch was cannulated and at this location, there was no diaphragmatic  stimulation.  The initial pacing threshold was 2.5V at 0.5 ms.  10V pacing  did not result in diaphragmatic stimulation.  The pacing impedance in the  left ventricle was 740 ohms.  It should be noted that previously RV and RA  leads were also positioned with the  RV lead placed in the RV apex.  Once it  was actively fixed, the pacing threshold there was 0.4V at 0.5 ms and the  pacing impedance in the right ventricle 702 ohms.  In the same way, P waves  in the atrial lead measured approximately 5 mV and the atrial threshold 0.2  V at 0.5 ms with pacing impedance of 595 ohms.  10 V pacing in both right  atrium, right ventricle and left ventricle did not result in diaphragmatic  stimulation.  At this point the leads were secured to the subpectoralis  fascia with a figure-of-eight silk suture.  In addition, the sewing sleeves  were secured with silk suture.  Electrocautery was utilized to make a  subcutaneous pocket.  The Guidant contact renewal model H135 serial number  [id number] was connected to the atrial right and left ventricular pacing leads  and placed in the subcutaneous pocket.  Kanamycin irrigation was utilized to  irrigate the pocket before and after the generator was inserted.  The  incision  was closed with a layer of 2-0 Vicryl followed by a layer of 3-0  Vicryl followed by a layer of 4-0 Vicryl.  Defibrillation threshold testing  was carried out.   After the patient was more deeply sedated with Valium and fentanyl VF was  induced with a T wave shock and a 14 joule shock terminated the ventricular  fibrillation restoring sinus rhythm.  Five minutes was allowed to elapse and  a second DFT test was carried out.  Again VF was induced with a T wave shock  and 14 joules terminated ventricular fibrillation restoring sinus rhythm.  At this point no additional DFT testing was carried out and the incision was  closed as previously mentioned and Steri-Strips were applied and the patient  returned to his room in satisfactory condition.   COMPLICATIONS:  There were no immediate procedure complications.   RESULTS:  This demonstrates successful biventricular dual chamber ICD  insertion in a patient with an ischemic cardiomyopathy, class 3 congestive  heart failure, inducible monomorphic VT and left bundle branch block.                                               [name]. [name], M.D. [facility]    GWT/MEDQ  D:  12/11/2001  T:  12/12/2001  Job:  [id number]   cc:   Arturo Morton. [name], M.D. [facility]   Jonelle Sidle, M.D. [facility]   Talmage Coin, M.D.

## 2010-07-22 NOTE — Cardiovascular Report (Signed)
NAME:  Corey [name], Corey [name]                           ACCOUNT NO.:  [id number]   MEDICAL RECORD NO.:  [id number]                   PATIENT TYPE:  INP   LOCATION:  3305                                 FACILITY:  [facility]   PHYSICIAN:  Rollene Rotunda, M.D.                DATE OF BIRTH:  1935-04-09   DATE OF PROCEDURE:  DATE OF DISCHARGE:                              CARDIAC CATHETERIZATION   PROCEDURE:  Left-heart catheterization/ coronary arteriography.   INDICATION:  Evaluate patient with unstable angina and non-Q wave myocardial  infarction.  He had bypass grafting in 1995 and has had multiple  catheterizations and intervention in that time.  He started developing  symptoms approximately two weeks ago.  He did have a small troponin  elevation on admission.   PROCEDURE NOTE:  Left-heart catheterization was performed via the right  femoral artery.  The artery was cannulated using anterior wall puncture.  A  #6 /French arterial sheath was inserted via the modified Seldinger  technique.  A preformed Judkins and a pigtail catheter were utilized.  The  patient tolerated the procedure well and left the lab in stable condition.   RESULTS:  HEMODYNAMICS:  LV 105/73, AO 104/23.   CORONARIES:  1. LEFT MAIN:  The left main had ostial 25% stenosis.  2. LEFT ANTERIOR DESCENDING:  The LAD had severe proximal and mid     calcification.  The first septal perforator was normal.  A second septal     perforator had 75% stenosis.  The first diagonal was large and branching.     The inferior branch had 80% stenosis.  The LAD was occluded after the     first diagonal.  The circumflex in the AV groove was occluded after mid     obtuse marginal.  3. RAMUS INTERMEDIUS:  The ramus intermedius was large with diffuse 25%     stenosis.  A mid obtuse marginal was small-to-medium sized and normal.  4. RIGHT CORONARY:  The right coronary artery was a large, dominant vessel.     There is proximal 50% stenosis followed by  mid moderate, diffuse disease.     There was mid 99% stenosis.  The PDA had long, ostial 90% stenosis.  5. GRAFTS:  LIMA to the LAD was patent.  Saphenous vein graft to OM2 was     patent.  The saphenous vein graft to the RCA had proximal 60% stenosis     followed by 99% stenosis with TIMI 1 flow.   LEFT VENTRICULOGRAM:  A left ventriculogram was obtained in the RAO  projection.  The ejection fraction was approximately 30% with global  hypokinesis, anteroapical akinesis, inferoapical akinesis.   CONCLUSION:  1. Severe three-vessel coronary artery disease.  2. Left ventricular dysfunction.  3. Critical saphenous vein graft to the right coronary artery disease.   PLAN:  The patient will have percutaneous revascularization of the saphenous  vein  graft to the RCA per Dr. Juanda Chance.                                               Rollene Rotunda, M.D.    JH/MEDQ  D:  06/05/2003  T:  06/06/2003  Job:  [id number]   cc:   Wilfrid Lund  [address]  [place]  [place] [address]  Fax: [phone]

## 2010-07-22 NOTE — Op Note (Signed)
NAME:  Corey [name], Corey [name]                           ACCOUNT NO.:  [id number]   MEDICAL RECORD NO.:  [id number]                   PATIENT TYPE:  OIB   LOCATION:  3729                                 FACILITY:  [facility]   PHYSICIAN:  [name]. [name], M.D. [facility]           DATE OF BIRTH:  June 25, 1935   DATE OF PROCEDURE:  12/11/2001  DATE OF DISCHARGE:                                 OPERATIVE REPORT   PROCEDURE PERFORMED:  Invasive electrophysiologic study followed by the  insertion of a dual chamber biventricular implantable  cardioverter/defibrillator (ICD) with defibrillation threshold testing.   I. INTRODUCTION:  The patient is a very pleasant 75 year old male with a  long history of coronary artery disease status post myocardial infarction  with severe left ventricular dysfunction and an ejection fraction of 25%.  He has left bundle branch block with a QRS duration of over 160 msec.  He  subsequently is referred for additional evaluation and treatment.  Of note,  the patient has a history of nonsustained ventricular tachycardia.   II. PROCEDURE:  After informed consent was obtained, the patient was taken  to the diagnostic EP lab in the fasting state.  After the usual preparation  and draping, intravenous Fentanyl and midazolam were given for sedation.  A  5-French quadripolar catheter was inserted percutaneously in the right  femoral vein and advanced to the RV apex.  A 5-French quadripolar catheter  was inserted percutaneous in the right femoral vein and advanced to the His  bundle region.  Rapid ventricular pacing was carried out from the RV apex  demonstrating VA association.  Programmed ventricular stimulation was  carried out from the RV apex at based stress cycle length of 640 msec.  S1  and S2, S1, S2, S3 and S1, S2, S3, and S4 stimuli were delivered with the S1  and S2, S2 and S3, and S3 and S4 intervals stepwise decreased down to  ventricular refractions.  At S1, S2 and S3  coupling interval of 600, 250,  210, and 230 there was inducible monomorphic VT.  This was a right bundle  left superior axis VT at a cycle length of 240 msec.  It was terminated with  rapid ventricular pacing at a cycle length of 200 msec.  At this point the  patient was prepped for ICD insertion.   III. COMPLICATIONS:  There were no immediate procedure complications.   IV. RESULTS:  A.  Baseline HCG:  The baseline HCG demonstrates normal sinus  rhythm with first-degree AV block and left bundle branch block.   B.  Baseline intervals:  Sinus node cycle length was 698 msec.  The QRS  duration was 158 msec.  The HP interval was 48 msec.   C.  Rapid ventricular pacing:  Rapid ventricular pacing was carried out from  the RV apex demonstrating re-association fo 600 msec.   D.  Programmed ventricular stimulation:  Programmed  ventricular stimulation  was carried out from the RV apex at based stress cycle lengths of 400 and  600 msec.  S1 and S2, S1, S2 and S3, and S1, S2, S3 and S4 stimuli were  delivered with the S1 and S2, S2 and S3, and S3 and S4 intervals stepwise  decreased down to ventricular refractions.  During the S1 and S2 and S3 and  S4 interval at 400, 600, 250, 210 and 230, the patient had inducible  monomorphic VT.   E.  Arrhythmias observed.  1. VT cycle length 240 msec.  Morphology - Right bundle, left superior axis.     Duration sustained.  Termination ventricular pacing.   V. CONCLUSION:  This study demonstrates inducible monomorphic ventricular  tachycardia in a patient with ischemic cardiomyopathy, nonsustained  ventricular tachycardia and left bundle branch block.  He will be referred  for biventricular implantable cardioverter/defibrillator (ICD) insertion.                                               [name]. [name], M.D. [facility]    GWT/MEDQ  D:  12/11/2001  T:  12/12/2001  Job:  [id number]   cc:   Arturo Morton. [name], M.D. [facility]   Jonelle Sidle, M.D. [facility]    Talmage Coin, M.D.

## 2010-07-22 NOTE — Consult Note (Signed)
NAME:  KING, PINZON NO.:  [id number]   MEDICAL RECORD NO.:  [id number]          PATIENT TYPE:  INP   LOCATION:  3035                         FACILITY:  [facility]   PHYSICIAN:  Pricilla Riffle, M.D.    DATE OF BIRTH:  [birth year]   DATE OF CONSULTATION:  07/14/2004  DATE OF DISCHARGE:                                   CONSULTATION   IDENTIFICATION:  Corey [name] is a 75 year old gentleman whom we were asked to  see regarding fever, question endocarditis/need for TEE.   HISTORY OF PRESENT ILLNESS:  The patient has a history of coronary artery  disease, status post CABG initially in 1995 and in April of this year  underwent CABG x4 (redo) with mitral valve annuloplasty.   The patient said he was doing well until Monday of this week.  Initially  developed left groin pain, advised primary M.D.  No changes in medicines was  made.  Slightly worse, and developed pain then in bilateral feet, right knee  and right elbow.  The right elbow and left groin pain have resolved, but  pain continued in the ankles and feet and he presented to the ER.  Denies  any fevers prior to admission.   Denies any chest pain, no increased shortness of breath.   ALLERGIES:  CODEINE.   MEDICATIONS (NOW):  1.  Vancomycin/gentamicin.  2.  Coumadin as directed.  3.  Lantus insulin.  4.  Combivent q.i.d.  5.  Potassium 20 mEq.  6.  Lasix 40 mg b.i.d.  7.  Allegra.  8.  Lanoxin 0.125 mg daily.  9.  Toprol XL 25 mg daily.  10. Aspirin 81 mg daily.  11. Neurontin 300 mg q.h.s.   PAST MEDICAL HISTORY:  1.  Coronary artery disease.  2.  CHF.  3.  History of ischemic cardiomyopathy with an EF of 20-25% by TEE.  4.  History of mitral regurgitation, status post annuloplasty.  5.  History of diabetes.  6.  Hypertension.  7.  Dyslipidemia.  8.  History of tobacco use.  9.  History of VT, status post ICD, Guidant Bi-V ICD placement, question      date.  10. History of peripheral vascular disease  with left internal carotid 40-      60%.  11. History of right bundle branch block.  12. History of COPD.   SOCIAL HISTORY:  The patient lives in [place] alone.  He is retired.  Quit tobacco in 1987.  Does not drink.   FAMILY HISTORY:  Mother died of renal failure and hypertension.  Father died  of rheumatoid arthritis.  No history of coronary disease.  One brother died  of an MI at age 73.  One brother died of sudden death at age 60.   REVIEW OF SYSTEMS:  Fevers, chill sensation.  Some dyspnea but no orthopnea,  no chest pain, no edema, no palpitations.  GENITOURINARY:  Negative.  ENDOCRINE:  As noted.  GASTROINTESTINAL:  Negative.  MUSCULOSKELETAL:  As  noted.  NEUROPSYCHIATRIC:  Negative.  Otherwise, all systems reviewed and  negative  except for the above problems as noted.   PHYSICAL EXAMINATION:  GENERAL:  Patient currently in no acute distress.  VITAL SIGNS:  T-max 102.5, current temperature 100.1.  Pulse is 90-104,  respiratory rate 18, blood pressure 119/68.  HEENT:  Normocephalic, atraumatic, PERRL.  Sclerae clear.  Throat clear.  NECK:  No lymphadenopathy.  CHEST:  Lungs clear to auscultation.  CARDIAC:  Regular rate and rhythm, S1, S2, no S3 or S4.  A grade 2/6  systolic murmur heard best at the left lower sternal border.  No rubs.  ABDOMEN:  Supple, nontender, no hepatomegaly.  EXTREMITIES:  Slight pedal edema.  Right knee slightly swollen.  Right and  left ankle slightly swollen, erythematous.  2+ pulses.  SKIN:  No definite lesions.   Chest x-ray:  No acute disease.  No residual pneumopericardium (compared to  June 16, 2004).  EKG not available.   Labs significant for a hemoglobin of 12.2, WBC of 9.4 with 80% neutrophils.  WESR increased at 50.  BUN and creatinine 15 and 1.  Potassium of 4.  Hemoglobin A1C 7.  Blood cultures:  No growth to date.  Synovial fluid:  No  organisms seen.  Occasional white cells (PMNs).   IMPRESSION:  Corey [name] is a 75 year old  with recent surgery (coronary  artery bypass graft with annuloplasty), also has a history of implantation  with a biventricular pacemaker/implantable cardioverter-defibrillator,  question implant date.  He presents with a few days of joint achiness,  swelling, now with fever.  White count, though, is normal though with slight  left shift.  No other lesions to suggest endocarditis.   On review of initial synovial fluid, no organisms are seen, some white  cells.  Cultures are pending.   Agree with current plan of antibiotics.  Cultures, again, pending.  A 2-D  echocardiogram scheduled.  Await further results before scheduling any  further cardiac testing such as TEE.  Will base on culture results.  Continue to follow clinically.      PVR/MEDQ  D:  07/14/2004  T:  07/15/2004  Job:  [id number]

## 2010-07-22 NOTE — Discharge Summary (Signed)
NAME:  Corey [name], Corey [name]                 ACCOUNT NO.:  [id number]   MEDICAL RECORD NO.:  [id number]          PATIENT TYPE:  INP   LOCATION:  3035                         FACILITY:  [facility]   PHYSICIAN:  Melissa L. [name], MD  DATE OF BIRTH:  Jan 14, 1936   DATE OF ADMISSION:  07/13/2004  DATE OF DISCHARGE:  07/19/2004                                 DISCHARGE SUMMARY   DISCHARGING DIAGNOSES:  1.  Migratory arthralgia.  This is felt to be secondary to possible      crystallin inflammatory condition consistent with gout.  The patient had      multiple joint aspirations by orthopedist who felt that there was some      amount of tophaceous material noted during aspiration.  His uric acid      remained in the low normal range and his fluid analysis did not overtly      confirm gout but the fluid was consistent with an inflammatory process      based on the cell content and the tophaceous material.  The patient did      respond favorably to the addition of colchicine to his regimen.  He will      remain on colchicine 0.6 mg b.i.d., which can be ultimately converted      over to allopurinol by his primary care physician.  At this time the      patient remains with minimal tenderness over the right ankle and left      medial malleolus.  He has no further erythema or fever and appears to      have responded favorably to the introduction of colchicine.  2.  Fever.  At this time Infectious Disease has evaluated the patient with      [place] during this hospital stay.  He has had negative blood cultures and      has defervesced after being treated with vancomycin and remained      afebrile despite discontinuation of antibiotics.  His urinalysis was      negative and there appeared to be no other sites of infection for this      patient.  The concern, obviously being post cardiac surgery, was for      endocarditis and bacteremia.  The patient underwent transthoracic echo      which showed no obvious vegetations  and at this time a transesophageal      echo is not recommended.  3.  Recurrent coronary artery disease with mitral valve regurgitation status      post valve replacement and CABG.  The patient remained stable on his      beta-blocker and digoxin as well as aspirin and Coumadin.  He remains on      Lasix 40 mg twice a day with potassium repletion.  During the course of      the hospital stay we did hold his benazepril and in light of the fact      that he has potential for gout I have continued to withhold the ACE      inhibitor.  This may  be able to be introduced in the future.  The      patient also has a history of monomorphic VT for which he has an AICD      placed.  This remains stable with no issues.  4.  Hypertension.  On the Toprol and digoxin, the patient has remained with      blood pressures in the low 100s.  I have, as mentioned above, held his      ACE inhibitor which can be reintroduced if needed in the future.  5.  Dyslipidemia.  Patient does not appear to be on any statin medications      according to the list that has been provided.  I will, therefore, refer      this to his cardiologist and primary care physician to review the need      for the introduction of a statin.  6.  Moderate chronic obstructive pulmonary disease with emphysematous      changes.  He currently requires no treatment for this and is stable.  7.  Peripheral vascular disease consisting of 40-60% left internal carotid      artery stenosis.  This remains asymptomatic and aggressive blood sugar,      lipid control as well as blood pressure control should be maintained.  8.  Type 2 diabetes.  The patient's blood sugars have remained under      acceptable control on Actos, Amaryl, Glucophage and Lantus.  9.  Possible peripheral neuropathy.  The patient describes a glove and      stocking type sensation associated with the increased pain in the feet.      This has not completely resolved with the  introduction of colchicine.      He was, therefore, started on Neurontin 300 mg at bedtime which he      tolerated.  From a GI perspective, I, therefore titrated this up to 300      mg b.i.d.  It can be further titrated if he continues to complain of a      cold pins and needles sensation.   MEDICATIONS AT THE TIME OF DISCHARGE:  1.  Toprol XL 25 mg daily.  2.  Digoxin 0.125 mg daily.  3.  Aspirin 81 mg daily.  4.  Coumadin 7.5 mg q.h.s.  5.  Lasix 40 mg b.i.d.  6.  Allegra 180 mg daily.  7.  Potassium 20 mEq daily.  8.  Lantus 20 units subcutaneously at bedtime.  9.  Glucophage 500 mg once daily.  10. Colchicine 0.6 mg p.o. b.i.d.  11. Amaryl 2 mg daily.  12. Neurontin 300 mg b.i.d.  13. Actos 15 mg daily.  14. The patient is to continue Tylenol p.r.n. for pain.  Would not recommend      using an NSAID at this time because of history of nephrolithiasis.   He will be instructed on decreasing the intake of red meat, avoiding salt  and low fat intake.  He will be encouraged to drink plenty of liquids.   SPECIAL INSTRUCTIONS:  If the patient develops fever, chills, nausea,  vomiting or diarrhea, he is to call Dr. Jason Fila.   FOLLOWUP:  Dr. Jason Fila is to see this patient within 1-2 weeks of discharge.  I  have left a message at the office and will direct the discharge summary to  his office.  Patient should also see the [facility] at the end of the  week to assure that his INR is  therapeutic.   HISTORY OF PRESENT ILLNESS:  The patient is a 75 year old white male with  recent cardiac surgery.  He presented to his primary care physician with  increased pain in his groin.  He was started on Skelaxin and oxycodone for  presumed musculoskeletal pain.  By Tuesday evening he awoke with resolution  of the groin pain but increased pain in his inner ankles and feet.  He,  therefore, came to the emergency room where he was found to have migratory arthralgias involving his feet, ankles, hands  and knees.  The patient was  unable to ambulate because the pain was so severe.  He was, therefore,  admitted for further evaluation, the obvious concern being possible  osteoarthritis with a seeding of joints.  The patient also complained of a  tingling burning sensation during the course of the hospital stay but denied  this during the admission.  The patient was then admitted to the general  medical floor and was evaluated by CT Surgery, Cardiology and Infectious  Disease during the course of the hospital stay.  Dopplers of lower  extremities showed no evidence of DVT, SVT or Baker's cyst.  He was  continued to be treated with pain medication, a uric acid level was checked  which was within normal limits.  An orthopedic consult was, therefore,  obtained and multiple joints were tapped which showed possible tophaceous  material but no obvious crystallin disease but there were multiple  inflammatory cells noted in the fluid suggestive of possible crystallin  disease.  Infectious Disease evaluated this patient and concurred with IV  vancomycin while we continued to work the patient up.  This was to treat  possible septic arthritis related to the potential for endocarditis given  his recent mitral valve surgery.  [facility] was consulted and  concurred with the workup.  A 2D echo was obtained which showed severe  hypokinesis of the apex and septal dyssynergy and an EF in the 35-40% range.  The mitral ring repair appeared well, there was no significant MR, no  vegetations were seen.  By Jul 16, 2004, the patient was noted to continue  to have low grade temperature after discovering possible tophaceous material  in the joint aspirations.  A trial of colchicine was started.  By Jul 17, 2004, the patient stated that his symptoms were significantly better, he  still had minimal tenderness in the right greater than left foot and left  quad area.  He remained with a low grade temperature  of 100.4.  All cultures  from the joints as well as blood cultures remained negative.  It was,  therefore, felt by Infectious Disease, that the patient could have his  antibiotics discontinued.  Off vancomycin the patient remained afebrile,  continued to complain of ever so slight discomfort in the left medial  malleolus and right lateral malleolar areas, but no warmth or tenderness  were noted nor was any swelling.  It was, therefore, felt that this patient  was not suffering from septic arthritis, but had ruled in in favor of a  possible inflammatory condition like gout based on his response to  colchicine.   On the day of discharge, the patient remained stable, his Tmax was 98.1, but  temperature this morning 98.3, blood pressure 119/74, pulse of 76,  respirations were 20, sat was 94% on room air.  Generally, this was a well-  developed, well-nourished white male in no acute distress. PUPILS:  Equal round, reactive  to light.  Extraocular muscles were intact.  MUCOUS MEMBRANES:  Moist.  NECK:  Supple.  There was no JVD, no lymph nodes, no carotid bruits.  CHEST:  Clear to auscultation.  There is no rhonchi, rubs or wheezes.  CARDIOVASCULAR:  Regular rate and rhythm.  Positive S1 and S2.  No S3 or S4  and no appreciable murmur is noted on this exam.  ABDOMEN:  Soft, nontender, nondistended with positive bowel sounds.  EXTREMITIES:  Showed no clubbing, cyanosis or edema.  There is no signs of  joint effusion or swelling.  NEUROLOGICALLY:  Appears intact.   PERTINENT LABORATORY VALUES DURING THE COURSE OF THIS HOSPITAL STAY:  Reveal  sed rate of 94, blood cultures negative, digoxin level of 0.6, uric acid  level of 5.4, Gram stain of synovial fluid negative.  Discharging white  count is 4.1 with a hemoglobin of 10.2, hematocrit of 30.1, platelets of  294, sodium of 141, potassium 3.7, chloride of 102, CO2 of 28, BUN of 6,  creatinine of 1.0, glucose of 98.   At this time the patient  seems stable for discharge to followup with Dr.  Jason Fila as an outpatient and to followup with Dr. Tyrone Sage in June as  previously scheduled.      MLT/MEDQ  D:  07/19/2004  T:  07/19/2004  Job:  [id number]   cc:   Sheliah Plane, MD  [address]  [place]  [place] [address]  Email: [email or web address] Delaine M.D. Jason Fila  [address]  [facility]  [place], [place] [address]   Arturo Morton. [name], M.D. [facility]

## 2010-07-22 NOTE — Discharge Summary (Signed)
NAME:  Corey [name], Corey [name]                 ACCOUNT NO.:  [id number]   MEDICAL RECORD NO.:  [id number]          PATIENT TYPE:  INP   LOCATION:  2030                         FACILITY:  [facility]   PHYSICIAN:  Sheliah Plane, MD    DATE OF BIRTH:  07-23-1935   DATE OF ADMISSION:  06/08/2004  DATE OF DISCHARGE:                                 DISCHARGE SUMMARY   ADMITTING DIAGNOSES:  Recurrent coronary occlusive disease with congestive  heart failure and mitral regurgitation.   PAST MEDICAL HISTORY AND DISCHARGE DIAGNOSES:  1.  Hypertension.  2.  Hyperlipidemia.  3.  Type 2 diabetes mellitus.  4.  Remote smoker.  5.  Inducible monomorphic ventricular tachycardia and placement of automatic      implantable cardioverter defibrillator device in October of 2003.  6.  Moderate chronic obstructive pulmonary disease with emphysematous      changes noted on serial computed tomography scans.  7.  Status post cervical laminectomy.  8.  Status post inguinal hernia repair.  9.  Coronary artery disease status post multiple cardiac catheterizations,      status post myocardial infarction in 1987 and 1995, status post multiple      stent placements, status post coronary artery bypass grafting in the      past, and status post coronary artery bypass grafting on 4 with mitral      valve repair.  10. Congestive heart failure.  11. History of nephrolithiasis.  12. Carotid Doppler study show no right internal carotid artery stenosis and      a 40-60% left internal carotid artery stenosis.   ALLERGIES:  CODEINE.   BRIEF HISTORY:  The patient is a 75 year old male with a longstanding  history of cardiac disease who presented to [facility] with symptoms of  increasing shortness of breath over the past 6 months associated with neck  pain.  The patient denies pedal edema, however he did note increasing  orthopnea, progressive cough and fatigue.  The patient has a history of  myocardial infarctions in 1987 and in  1995.  He has undergone at least 13  prior cardiac catheterizations, angioplasties and multiple stent placements.  The patient has also undergone coronary artery bypass grafting x4 in the  past.  The patient was evaluated by Dr. [name] who thought it best to admit  the patient for repeat cardiac catheterization and treatment of his evident  congestive heart failure.   HOSPITAL COURSE:  The patient was admitted by Dr. [name] with congestive  heart failure and symptoms indicative of recurrent angina.  The patient was  diuresed and set up for cardiac catheterization.  He was taken for cardiac  catheterization on June 09, 2004.  This revealed severe 3-vessel coronary  disease with occlusion of the right coronary.  The overall ejection fraction  appeared to be 25%.  Echocardiogram revealed 3+ mitral regurgitation, severe  left ventricular dysfunction and global hypokinesis as well as left atrial  enlargement with no thrombus.  Secondary to this information, Dr. Tyrone Sage  of [facility] services was consulted regarding re-do surgical revascularization.  Dr. Tyrone Sage evaluated  the patient on June 10, 2004 and it was his opinion  that the patient should proceed with re-do coronary artery bypass grafting  and mitral valve repair.   The patient was maintained on routine hospital care and his Plavix was  stopped in anticipation of surgery.  The patient was taken to the OR on  June 13, 2004 for re-do sternotomy with re-do coronary artery bypass  grafting x4 and a mitral valve repair with Annuloplasty rings.  Saphenous  veins grafted to the right PLA, saphenous veins grafted to the OM and  sequenced to the ramus intermediate and saphenous vein grafted to the third  diagonal.  The saphenous veins harvesting was performed on the left lower  extremity.  Of note, there was stents in the PDA, making it not amenable to  bypass.  The patient tolerated the procedure well and was transferred in  stable condition  immediately postoperatively.  The patient was transferred  from the OR to the post surgical care unit in stable condition.  The patient  was extubated without complication and was __________ from anesthesia  neurologically intact.   The patient's postoperative course has progressed as expected.  Chest tubes  and invasive lines were discontinued in a routine manner.  Then all drips  were weaned according.  CBA and cardiac rehab on postoperative on day one  and has tolerated this quite well.  He has been somewhat volume overloaded  postoperatively and has been diuresed accordingly.  The patient was also  started on Coumadin on postoperative day 1 in light of his mitral valve  Annuloplasty ring.  He will be continued on low-dose aspirin in addition to  the Coumadin and his Plavix will not be restarted at this time.   His diabetes mellitus has been controlled postoperatively with Lantus  insulin and the addition of Amaryl.  He has also been maintained on sliding  scale insulin for blood sugar control.   On postoperative day 3, the patient is without complaints.  His vital signs  are stable and he has been maintaining a normal sinus rhythm.  He is  ambulating well.  On physical exam, respirations at clear to auscultation,  cardiac is regular rate and rhythm and his incisions are clean and dry and  intact.  The abdomen is benign and there is minimal edema  present in the  bilateral lower extremities.  The patient is doing quite well at this time  and as long as he continues to progress in the current manner he should be  ready for discharge in the next 1-2 days pending the morning re evaluation  and a therapeutic Coumadin level.  His INR is currently 1.1 and will need to  be at least 2.0 prior to discharge.   LABORATORY DATA:  CBC and BMET on June 16, 2004:  White count 7.2,  hemoglobin 9.6, hematocrit 27.6, platelets 112.  Sodium 133, potassium 4.5, BUN 16, creatinine 1.2, glucose i40.    CONDITION ON DISCHARGE:  Improved.   DISCHARGE INSTRUCTIONS:  1.  Medications:  Aspirin 81 mg daily, Toprol XL 25 mg daily, Lanoxin 0.125      mg daily, Coumadin dose to be determined prior to discharge and then      directed by [facility], Amaryl 2 mg daily, Lantus insulin      40 units subcutaneous q.h.s., Tylox 1-2 p.o. q.4-6h p.r.n. pain.  2.  Activities:  No driving, no lifting of more than 10 pounds and the  patient is to continue his breathing and walking exercises.  3.  Diet:  Low salt, low fat, and carbohydrate modified, medium calorie.  4.  Wound care:  The patient may shower daily cleaning his incisions with      soap and water.  If wound problems arise, the patient is instructed to      contact the [facility] office at __________ [phone].   FOLLOW UP:  1.  PT and INR is to be drawn on June 21, 2004 at 10:30 a.m. at [facility]      Coumadin clinic.  2.  With Dr. Rosalyn Charters PA on June 29, 2004 at 2:30.  A chest x-ray will be      taken at that time which the patient      will be instructed to bring with him to the follow-up appointment with      Dr. Dorris Fetch.  3.  Dr. Dorris Fetch on Jul 07, 2004 at 2:15.  The patient will follow up with      Dr. Dorris Fetch as Dr. Tyrone Sage will be in [facility] and unable to      follow up with the patient.      AY/MEDQ  D:  06/16/2004  T:  06/16/2004  Job:  [id number]   cc:   Arturo Morton. [name], M.D. [facility]   Sheliah Plane, MD  [address]  [place]  [place] [address]  Email: [email or web address]

## 2010-09-06 ENCOUNTER — Other Ambulatory Visit: Payer: Self-pay | Admitting: Cardiology

## 2010-09-06 MED ORDER — DOFETILIDE 500 MCG PO CAPS: 500.0000 ug | ORAL_CAPSULE | Freq: Two times a day (BID) | ORAL | Status: DC

## 2010-10-03 ENCOUNTER — Ambulatory Visit: Payer: Medicare Other | Admitting: Cardiology

## 2010-10-03 ENCOUNTER — Encounter: Payer: Medicare Other | Admitting: *Deleted

## 2010-10-31 ENCOUNTER — Encounter: Payer: Self-pay | Admitting: Cardiology

## 2010-10-31 ENCOUNTER — Encounter: Payer: Self-pay | Admitting: Internal Medicine

## 2010-10-31 ENCOUNTER — Other Ambulatory Visit: Payer: Self-pay | Admitting: Internal Medicine

## 2010-10-31 ENCOUNTER — Ambulatory Visit (INDEPENDENT_AMBULATORY_CARE_PROVIDER_SITE_OTHER): Payer: Medicare Other | Admitting: *Deleted

## 2010-10-31 ENCOUNTER — Telehealth: Payer: Self-pay | Admitting: Cardiology

## 2010-10-31 ENCOUNTER — Ambulatory Visit (INDEPENDENT_AMBULATORY_CARE_PROVIDER_SITE_OTHER): Payer: Medicare Other | Admitting: Cardiology

## 2010-10-31 VITALS — BP 149/85 | HR 60 | Ht 69.0 in | Wt 202.8 lb

## 2010-10-31 DIAGNOSIS — Z95810 Presence of automatic (implantable) cardiac defibrillator: Secondary | ICD-10-CM

## 2010-10-31 DIAGNOSIS — I5022 Chronic systolic (congestive) heart failure: Secondary | ICD-10-CM

## 2010-10-31 DIAGNOSIS — I4891 Unspecified atrial fibrillation: Secondary | ICD-10-CM

## 2010-10-31 DIAGNOSIS — I25810 Atherosclerosis of coronary artery bypass graft(s) without angina pectoris: Secondary | ICD-10-CM

## 2010-10-31 DIAGNOSIS — I2510 Atherosclerotic heart disease of native coronary artery without angina pectoris: Secondary | ICD-10-CM

## 2010-10-31 LAB — BASIC METABOLIC PANEL
BUN: 21 mg/dL (ref 6–23)
CO2: 27 mEq/L (ref 19–32)
Calcium: 9.3 mg/dL (ref 8.4–10.5)
Chloride: 98 mEq/L (ref 96–112)
Creatinine, Ser: 1.2 mg/dL (ref 0.4–1.5)
GFR: 66.29 mL/min (ref >60.00)
Glucose, Bld: 312 mg/dL — ABNORMAL HIGH (ref 70–99)
Potassium: 4.2 mEq/L (ref 3.5–5.1)
Sodium: 135 mEq/L (ref 135–145)

## 2010-10-31 LAB — ICD DEVICE OBSERVATION
AL AMPLITUDE: 8.3 mv
AL IMPEDENCE ICD: 638 Ohm
AL THRESHOLD: 1.2 V
AL THRESHOLD: 1.4 V
AL THRESHOLD: 1.4 V
AL THRESHOLD: 1.4 V
ATRIAL PACING ICD: 35 pct
BAMS-0001: 170 {beats}/min
BAMS-0002: 8 ms
BAMS-0003: 70 {beats}/min
BATTERY VOLTAGE: 2.56 V
CHARGE TIME: 9 s
DEV-0020ICD: NEGATIVE
DEVICE MODEL ICD: 316804
FVT: 0
HV IMPEDENCE: 45 Ohm
LV LEAD AMPLITUDE: 12.3 mv
LV LEAD IMPEDENCE ICD: 819 Ohm
LV LEAD THRESHOLD: 2.2 V
PACEART VT: 2
RV LEAD IMPEDENCE ICD: 766 Ohm
RV LEAD THRESHOLD: 0.4 V
TOT-0001: 1
TOT-0002: 0
TOT-0006: 20120508000000
TZAT-0001FASTVT: 1
TZAT-0001FASTVT: 2
TZAT-0001SLOWVT: 1
TZAT-0001SLOWVT: 2
TZAT-0002SLOWVT: NEGATIVE
TZAT-0002SLOWVT: NEGATIVE
TZAT-0004FASTVT: 8
TZAT-0004FASTVT: 8
TZAT-0005FASTVT: 81 pct
TZAT-0005FASTVT: 81 pct
TZAT-0012FASTVT: 200 ms
TZAT-0012FASTVT: 200 ms
TZAT-0013FASTVT: 2
TZAT-0013FASTVT: 2
TZAT-0018FASTVT: NEGATIVE
TZAT-0018FASTVT: NEGATIVE
TZAT-0018SLOWVT: NEGATIVE
TZAT-0018SLOWVT: NEGATIVE
TZAT-0019FASTVT: 7.5 V
TZAT-0019FASTVT: 7.5 V
TZAT-0020FASTVT: 1 ms
TZAT-0020FASTVT: 1 ms
TZON-0003FASTVT: 353 ms
TZON-0003SLOWVT: 463 ms
TZON-0004FASTVT: 2.5
TZON-0004SLOWVT: 10
TZON-0005FASTVT: 1
TZON-0005SLOWVT: 1
TZST-0001FASTVT: 3
TZST-0001FASTVT: 4
TZST-0001FASTVT: 5
TZST-0001FASTVT: 6
TZST-0001FASTVT: 7
TZST-0003FASTVT: 14 J
TZST-0003FASTVT: 21 J
TZST-0003FASTVT: 31 J
TZST-0003FASTVT: 31 J
TZST-0003FASTVT: 31 J
VENTRICULAR PACING ICD: 99 pct
VF: 0

## 2010-10-31 NOTE — Telephone Encounter (Signed)
Opened in error

## 2010-10-31 NOTE — Patient Instructions (Signed)
Your physician recommends that you schedule a follow-up appointment in: 3 months with Dr. [name]  Your physician has recommended you make the following change in your medication: STOP PLAVIX per patient request.

## 2010-10-31 NOTE — Assessment & Plan Note (Signed)
Scheduled to see Dr. [name].  Will discuss Tikosyn with him.  Patient wants to stop plavix first to see if that is cause of rash.  Has not had aifb since ging on this, and seems to tolerate it well.

## 2010-10-31 NOTE — Assessment & Plan Note (Signed)
No recurrent symptoms.  Has been on chronic plavix, but with rash wants to stop.  Discussed at length with patient.  He wants to try.  He understands our concerns.

## 2010-10-31 NOTE — Progress Notes (Signed)
icd check in clinic  

## 2010-10-31 NOTE — Progress Notes (Signed)
HPI:  Patient is stable.  He has had a rash in multiple locations.  He has seen a dermatologist.  He has seen ENT because his nose is sore.  He thinks it is related to his plavix and wants to stop.  He understands some of the issues,and acknowledges this.  The lesions are red, raised and he places neosporin.  Not particularly short of breath.  Overall stable.    Current Outpatient Prescriptions  Medication Sig Dispense Refill  . aspirin 81 MG tablet Take 81 mg by mouth daily.        . benazepril (LOTENSIN) 20 MG tablet Take 20 mg by mouth daily.        . clopidogrel (PLAVIX) 75 MG tablet Take 75 mg by mouth daily.        . digoxin (LANOXIN) 0.25 MG tablet Take 250 mcg by mouth daily.        Marland Kitchen dofetilide (TIKOSYN) 500 MCG capsule Take 1 capsule (500 mcg total) by mouth 2 (two) times daily.  180 capsule  3  . furosemide (LASIX) 40 MG tablet Take 40 mg by mouth 2 (two) times daily.        Marland Kitchen glimepiride (AMARYL) 4 MG tablet Take 4 mg by mouth daily before breakfast.        . isosorbide dinitrate (ISORDIL) 30 MG tablet Take 30 mg by mouth daily. Taking 1/2 tablet daily       . metoprolol (LOPRESSOR) 50 MG tablet Take 25 mg by mouth. Takes 1 by mouth every day      . potassium chloride SA (K-DUR,KLOR-CON) 20 MEQ tablet Take 20 mEq by mouth daily. Taking 1 1/2 tablets daily      . triazolam (HALCION) 0.25 MG tablet Take 0.25 mg by mouth at bedtime as needed.          Allergies  Allergen Reactions  . Ativan   . Codeine     Past Medical History  Diagnosis Date  . Atrial fibrillation   . CAD, ARTERY BYPASS GRAFT   . CARDIOMYOPATHY, ISCHEMIC   . Chronic systolic heart failure   . DIABETES MELLITUS   . Dysuria   . HYPERLIPIDEMIA-MIXED   . ICD - IN SITU   . RENAL INSUFFICIENCY     Past Surgical History  Procedure Date  . Coronary artery bypass graft   . Redo cabg   . Mitral valve repair     with an angioplasty ring  . Inguinal hernia repair   . Cervical discectomy     C3-C4  . Cardiac  defibrillator placement     Guidant    Family History  Problem Relation Age of Onset  . Hypertension Other     History   Social History  . Marital Status: Widowed    Spouse Name: N/A    Number of Children: N/A  . Years of Education: N/A   Occupational History  . Not on file.   Social History Main Topics  . Smoking status: Former Smoker -- 1.5 packs/day for 40 years    Types: Cigarettes, Pipe    Quit date: 03/06/1985  . Smokeless tobacco: Not on file  . Alcohol Use: Not on file  . Drug Use: Not on file  . Sexually Active: Not on file   Other Topics Concern  . Not on file   Social History Narrative  . No narrative on file    ROS: Please see the HPI.  All other systems reviewed and negative.  PHYSICAL EXAM:  BP 149/85  Pulse 60  Ht 5\' 9"  (1.753 m)  Wt 202 lb 12.8 oz (91.989 kg)  BMI 29.95 kg/m2  General: Well developed, well nourished, in no acute distress. Head:  Normocephalic and atraumatic. Neck: no JVD Lungs: Clear to auscultation and percussion. Heart: Normal S1 and S2.  No murmur, rubs or gallops.  Abdomen:  Normal bowel sounds; soft; non tender; no organomegaly Pulses: Pulses normal in all 4 extremities. Extremities: No clubbing or cyanosis. No edema. Neurologic: Alert and oriented x 3. Skin:  Raised lesion left of knee 2 by 2 cm raised red, with small scab.    EKG:  AV pacing.     ASSESSMENT AND PLAN:

## 2010-10-31 NOTE — Assessment & Plan Note (Signed)
Hemodynamics are stable.  Check BMET for meds today.

## 2010-11-30 LAB — CBC
HCT: 43.2
Hemoglobin: 14.6
MCHC: 33.9
MCV: 95.2
Platelets: 163
RBC: 4.54
RDW: 15.1
WBC: 4.9

## 2010-11-30 LAB — BASIC METABOLIC PANEL
BUN: 18
CO2: 27
Calcium: 9.1
Chloride: 104
Creatinine, Ser: 1.17
GFR calc Af Amer: >60
GFR calc non Af Amer: >60
Glucose, Bld: 112 — ABNORMAL HIGH
Potassium: 4.1
Sodium: 139

## 2010-11-30 LAB — PROTIME-INR
INR: 1
Prothrombin Time: 13.1

## 2010-11-30 LAB — APTT: aPTT: 28

## 2010-12-05 LAB — CBC
HCT: 40.4
HCT: 41.8
HCT: 44.4
HCT: 44.7
Hemoglobin: 13.7
Hemoglobin: 14.9
MCHC: 33.9
MCV: 96.3
MCV: 96.5
MCV: 97.3
Platelets: 121 — ABNORMAL LOW
Platelets: 133 — ABNORMAL LOW
Platelets: 134 — ABNORMAL LOW
RBC: 4.65
RDW: 15.1
RDW: 15.2
RDW: 15.3
WBC: 4.9
WBC: 5.2

## 2010-12-05 LAB — GLUCOSE, CAPILLARY
Glucose-Capillary: 101 — ABNORMAL HIGH
Glucose-Capillary: 102 — ABNORMAL HIGH
Glucose-Capillary: 112 — ABNORMAL HIGH
Glucose-Capillary: 113 — ABNORMAL HIGH
Glucose-Capillary: 117 — ABNORMAL HIGH
Glucose-Capillary: 120 — ABNORMAL HIGH
Glucose-Capillary: 122 — ABNORMAL HIGH
Glucose-Capillary: 127 — ABNORMAL HIGH
Glucose-Capillary: 161 — ABNORMAL HIGH
Glucose-Capillary: 84
Glucose-Capillary: 88
Glucose-Capillary: 89

## 2010-12-05 LAB — DIFFERENTIAL
Eosinophils Absolute: 0.1
Eosinophils Relative: 2
Lymphocytes Relative: 28
Lymphs Abs: 1.4
Monocytes Absolute: 0.7
Monocytes Relative: 13 — ABNORMAL HIGH

## 2010-12-05 LAB — PROTIME-INR: INR: 1

## 2010-12-05 LAB — BASIC METABOLIC PANEL
BUN: 15
BUN: 15
BUN: 21
CO2: 27
Chloride: 105
Creatinine, Ser: 1.05
Creatinine, Ser: 1.14
Creatinine, Ser: 1.27
GFR calc Af Amer: >60
GFR calc non Af Amer: 56 — ABNORMAL LOW
GFR calc non Af Amer: >60
Glucose, Bld: 105 — ABNORMAL HIGH
Glucose, Bld: 96
Glucose, Bld: 98
Potassium: 4.3
Potassium: 4.3
Potassium: 4.4
Sodium: 137
Sodium: 138

## 2010-12-05 LAB — POCT I-STAT, CHEM 8
BUN: 29 — ABNORMAL HIGH
Calcium, Ion: 1.2
Creatinine, Ser: 1.4
Glucose, Bld: 136 — ABNORMAL HIGH
Hemoglobin: 15.3
Sodium: 141
TCO2: 30

## 2010-12-05 LAB — LIPID PANEL
LDL Cholesterol: 132 — ABNORMAL HIGH
Triglycerides: 76
VLDL: 15

## 2010-12-05 LAB — TSH: TSH: 4.368

## 2010-12-05 LAB — HEPARIN LEVEL (UNFRACTIONATED): Heparin Unfractionated: 0.69

## 2010-12-05 LAB — APTT: aPTT: 30

## 2010-12-05 LAB — HEMOGLOBIN A1C: Hgb A1c MFr Bld: 6.8 — ABNORMAL HIGH

## 2010-12-13 LAB — CBC
MCHC: 34.4
RDW: 15.4 — ABNORMAL HIGH

## 2010-12-13 LAB — BASIC METABOLIC PANEL
CO2: 25
Calcium: 8.7
Creatinine, Ser: 1.07
GFR calc Af Amer: >60
Glucose, Bld: 158 — ABNORMAL HIGH

## 2010-12-13 LAB — MAGNESIUM: Magnesium: 2.2

## 2010-12-14 LAB — CBC
HCT: 43
Hemoglobin: 14.7
MCHC: 34.2
MCV: 95.2
Platelets: 154
RDW: 15.4 — ABNORMAL HIGH
WBC: 7.9

## 2010-12-14 LAB — BASIC METABOLIC PANEL
CO2: 26
CO2: 28
Calcium: 8.9
Chloride: 100
Chloride: 97
Creatinine, Ser: 0.97
GFR calc Af Amer: >60
Glucose, Bld: 167 — ABNORMAL HIGH
Potassium: 4.2
Sodium: 134 — ABNORMAL LOW

## 2010-12-14 LAB — DIFFERENTIAL
Basophils Absolute: 0
Eosinophils Relative: 1
Lymphocytes Relative: 15
Monocytes Absolute: 0.6

## 2010-12-14 LAB — COMPREHENSIVE METABOLIC PANEL
AST: 26
Albumin: 3.5
Alkaline Phosphatase: 61
Chloride: 100
Creatinine, Ser: 1.1
GFR calc Af Amer: >60
Potassium: 4.2
Total Bilirubin: 1.5 — ABNORMAL HIGH

## 2010-12-28 ENCOUNTER — Ambulatory Visit: Payer: Medicare Other | Admitting: Specialist

## 2011-01-30 ENCOUNTER — Encounter: Payer: Self-pay | Admitting: Cardiology

## 2011-01-30 ENCOUNTER — Ambulatory Visit (INDEPENDENT_AMBULATORY_CARE_PROVIDER_SITE_OTHER): Payer: Medicare Other | Admitting: Cardiology

## 2011-01-30 DIAGNOSIS — R634 Abnormal weight loss: Secondary | ICD-10-CM

## 2011-01-30 DIAGNOSIS — I4891 Unspecified atrial fibrillation: Secondary | ICD-10-CM

## 2011-01-30 DIAGNOSIS — E785 Hyperlipidemia, unspecified: Secondary | ICD-10-CM

## 2011-01-30 DIAGNOSIS — I2589 Other forms of chronic ischemic heart disease: Secondary | ICD-10-CM

## 2011-01-30 DIAGNOSIS — I25810 Atherosclerosis of coronary artery bypass graft(s) without angina pectoris: Secondary | ICD-10-CM

## 2011-01-30 LAB — BASIC METABOLIC PANEL
BUN: 26 mg/dL — ABNORMAL HIGH (ref 6–23)
Calcium: 9.7 mg/dL (ref 8.4–10.5)
Creatinine, Ser: 1.5 mg/dL (ref 0.4–1.5)
GFR: 50.29 mL/min — ABNORMAL LOW (ref >60.00)

## 2011-01-30 LAB — CBC WITH DIFFERENTIAL/PLATELET
Basophils Absolute: 0 10*3/uL (ref 0.0–0.1)
Eosinophils Relative: 2 % (ref 0.0–5.0)
Monocytes Absolute: 0.5 10*3/uL (ref 0.1–1.0)
Monocytes Relative: 7 % (ref 3.0–12.0)
Neutrophils Relative %: 68.6 % (ref 43.0–77.0)
Platelets: 212 10*3/uL (ref 150.0–400.0)
RDW: 14.3 % (ref 11.5–14.6)
WBC: 6.9 10*3/uL (ref 4.5–10.5)

## 2011-01-30 LAB — TSH: TSH: 2 u[IU]/mL (ref 0.35–5.50)

## 2011-01-30 NOTE — Progress Notes (Signed)
Patient ID: Corey [name], male   DOB: December 01, 1935, 75 y.o.   MRN: [id number]

## 2011-01-30 NOTE — Assessment & Plan Note (Signed)
No congestive symptoms.  Doing well.  Will plan to continue meds at present.

## 2011-01-30 NOTE — Assessment & Plan Note (Signed)
Just started recently.  Will check TSH.  ? Secondary to diabetes???

## 2011-01-30 NOTE — Progress Notes (Signed)
HPI:  Patient is in for follow up.  He is stable.  He has had some skin infections, treated with antibiotics which helped his sinus.  Generally he feels good, but he has had some persistent weight loss.  He does have a primary MD now.  NO chest pain or progressive shortness of breath.  Current Outpatient Prescriptions  Medication Sig Dispense Refill  . aspirin 81 MG tablet Take 81 mg by mouth daily.        . benazepril (LOTENSIN) 20 MG tablet Take 20 mg by mouth daily.        . digoxin (LANOXIN) 0.25 MG tablet Take 250 mcg by mouth daily.        Marland Kitchen dofetilide (TIKOSYN) 500 MCG capsule Take 1 capsule (500 mcg total) by mouth 2 (two) times daily.  180 capsule  3  . furosemide (LASIX) 40 MG tablet Take 40 mg by mouth 2 (two) times daily.        Marland Kitchen glimepiride (AMARYL) 4 MG tablet Take 4 mg by mouth daily before breakfast.        . isosorbide dinitrate (ISORDIL) 30 MG tablet Take 30 mg by mouth daily. Taking 1/2 tablet daily       . metoprolol (LOPRESSOR) 50 MG tablet 1/2 tab po bid      . potassium chloride SA (K-DUR,KLOR-CON) 20 MEQ tablet Take 20 mEq by mouth daily. Taking 1 1/2 tablets daily      . triazolam (HALCION) 0.25 MG tablet Take 0.25 mg by mouth at bedtime as needed.          Allergies  Allergen Reactions  . Ativan   . Codeine     Past Medical History  Diagnosis Date  . Atrial fibrillation   . CAD, ARTERY BYPASS GRAFT   . CARDIOMYOPATHY, ISCHEMIC   . Chronic systolic heart failure   . DIABETES MELLITUS   . Dysuria   . HYPERLIPIDEMIA-MIXED   . ICD - IN SITU   . RENAL INSUFFICIENCY     Past Surgical History  Procedure Date  . Coronary artery bypass graft   . Redo cabg   . Mitral valve repair     with an angioplasty ring  . Inguinal hernia repair   . Cervical discectomy     C3-C4  . Cardiac defibrillator placement     Guidant    Family History  Problem Relation Age of Onset  . Hypertension Other     History   Social History  . Marital Status: Widowed   Spouse Name: N/A    Number of Children: N/A  . Years of Education: N/A   Occupational History  . Not on file.   Social History Main Topics  . Smoking status: Former Smoker -- 1.5 packs/day for 40 years    Types: Cigarettes, Pipe    Quit date: 03/06/1985  . Smokeless tobacco: Not on file  . Alcohol Use: Not on file  . Drug Use: Not on file  . Sexually Active: Not on file   Other Topics Concern  . Not on file   Social History Narrative  . No narrative on file    ROS: Please see the HPI.  All other systems reviewed and negative.  PHYSICAL EXAM:  BP 130/69  Pulse 60  Ht 5\' 9"  (1.753 m)  Wt 89.359 kg (197 lb)  BMI 29.09 kg/m2  General: Well developed, well nourished, in no acute distress. Head:  Normocephalic and atraumatic. Neck: no JVD Lungs: Clear to  auscultation and percussion. Heart: Normal S1 and S2. PMI non displaced.  No murmur.   Abdomen:  Normal bowel sounds; soft; non tender; no organomegaly Pulses: Pulses normal in all 4 extremities. Extremities: No clubbing or cyanosis. No edema. Neurologic: Alert and oriented x 3.  EKG:  AV pacing.  QTC 564 with baseline QRS widening due to paced rhythm.  ASSESSMENT AND PLAN:

## 2011-01-30 NOTE — Patient Instructions (Signed)
Your physician recommends that you schedule a follow-up appointment in: 3 MONTHS  Your physician recommends that you have lab work today: CBC, TSH and BMP  Your physician recommends that you continue on your current medications as directed. Please refer to the Current Medication list given to you today.

## 2011-01-30 NOTE — Assessment & Plan Note (Signed)
Maintaining NSR.  Will check labs to make sure we are current.

## 2011-01-30 NOTE — Assessment & Plan Note (Signed)
NO recurrent chest pain at present. He feels pretty good overall.  Denies chest pain.

## 2011-01-30 NOTE — Assessment & Plan Note (Signed)
Has always refused statins.

## 2011-02-16 ENCOUNTER — Encounter: Payer: Self-pay | Admitting: Internal Medicine

## 2011-02-16 ENCOUNTER — Ambulatory Visit (INDEPENDENT_AMBULATORY_CARE_PROVIDER_SITE_OTHER): Payer: Medicare Other | Admitting: Internal Medicine

## 2011-02-16 DIAGNOSIS — I5022 Chronic systolic (congestive) heart failure: Secondary | ICD-10-CM

## 2011-02-16 DIAGNOSIS — I2589 Other forms of chronic ischemic heart disease: Secondary | ICD-10-CM

## 2011-02-16 DIAGNOSIS — I4891 Unspecified atrial fibrillation: Secondary | ICD-10-CM

## 2011-02-16 LAB — ICD DEVICE OBSERVATION
AL IMPEDENCE ICD: 613 Ohm
BAMS-0001: 170 {beats}/min
BAMS-0003: 70 {beats}/min
BATTERY VOLTAGE: 2.55 V
DEVICE MODEL ICD: 316804
HV IMPEDENCE: 45 Ohm
LV LEAD IMPEDENCE ICD: 819 Ohm
LV LEAD THRESHOLD: 2.2 V
RV LEAD AMPLITUDE: 18.3 mv
RV LEAD IMPEDENCE ICD: 754 Ohm
RV LEAD THRESHOLD: 0.4 V
TZAT-0001FASTVT: 2
TZAT-0001SLOWVT: 2
TZAT-0002SLOWVT: NEGATIVE
TZAT-0004FASTVT: 8
TZAT-0004FASTVT: 8
TZAT-0005FASTVT: 81 pct
TZAT-0005FASTVT: 81 pct
TZAT-0012FASTVT: 200 ms
TZAT-0013FASTVT: 2
TZAT-0018FASTVT: NEGATIVE
TZAT-0018SLOWVT: NEGATIVE
TZON-0003SLOWVT: 463 ms
TZON-0004SLOWVT: 10
TZON-0005FASTVT: 1
TZST-0001FASTVT: 4
TZST-0001FASTVT: 6
TZST-0003FASTVT: 14 J
TZST-0003FASTVT: 31 J
VENTRICULAR PACING ICD: 99 pct

## 2011-02-16 NOTE — Patient Instructions (Signed)
Your physician recommends that you schedule a follow-up appointment in: 1 year with Dr Taylor   

## 2011-02-16 NOTE — Assessment & Plan Note (Signed)
Today we discussed the treatment options. The patient would like to switch antiarrhythmic drug therapy for a cheaper option. I discussed the pros and cons of doing this as well as the risk and benefit. After a long discussion, he would like to continue with his dofetilide though he will consider switching to amiodarone down the road. He does have a history of chronic COPD making him a less than ideal candidate for amiodarone.

## 2011-02-16 NOTE — Progress Notes (Signed)
HPI [name] returned today for followup. He is a very pleasant 75 year old man with an ischemic cardiomyopathy, chronic systolic heart failure, left bundle branch block, and atrial fibrillation. The patient has done quite nicely with dofetilide. Almost 5 years ago he had severe congestive heart failure and was placed on dofetilide. Since then he is maintained in sinus rhythm very nicely. His only complaint today is with regard to the cost of dofetilide. He wonders about other antiarrhythmic drug options. He denies cough or hemoptysis. No chest pain or peripheral edema. He has a history of monomorphic ventricular tachycardia this had no recurrent symptoms. No ICD shocks. Allergies  Allergen Reactions  . Ativan   . Codeine      Current Outpatient Prescriptions  Medication Sig Dispense Refill  . aspirin 81 MG tablet Take 81 mg by mouth daily.        . benazepril (LOTENSIN) 20 MG tablet Take 20 mg by mouth daily.        . digoxin (LANOXIN) 0.25 MG tablet Take 250 mcg by mouth daily.        Marland Kitchen dofetilide (TIKOSYN) 500 MCG capsule Take 1 capsule (500 mcg total) by mouth 2 (two) times daily.  180 capsule  3  . furosemide (LASIX) 40 MG tablet Take 40 mg by mouth 2 (two) times daily.        Marland Kitchen glimepiride (AMARYL) 4 MG tablet Take 4 mg by mouth daily before breakfast.        . isosorbide dinitrate (ISORDIL) 30 MG tablet Take 30 mg by mouth daily. Taking 1/2 tablet daily       . metoprolol (LOPRESSOR) 50 MG tablet 1/2 tab po bid      . potassium chloride SA (K-DUR,KLOR-CON) 20 MEQ tablet Take 20 mEq by mouth daily. Taking 1 1/2 tablets daily      . triazolam (HALCION) 0.25 MG tablet Take 0.25 mg by mouth at bedtime as needed.           Past Medical History  Diagnosis Date  . Atrial fibrillation   . CAD, ARTERY BYPASS GRAFT   . CARDIOMYOPATHY, ISCHEMIC   . Chronic systolic heart failure   . DIABETES MELLITUS   . Dysuria   . HYPERLIPIDEMIA-MIXED   . ICD - IN SITU   . RENAL INSUFFICIENCY      ROS:   All systems reviewed and negative except as noted in the HPI.   Past Surgical History  Procedure Date  . Coronary artery bypass graft   . Redo cabg   . Mitral valve repair     with an angioplasty ring  . Inguinal hernia repair   . Cervical discectomy     C3-C4  . Cardiac defibrillator placement     Guidant     Family History  Problem Relation Age of Onset  . Hypertension Other      History   Social History  . Marital Status: Widowed    Spouse Name: N/A    Number of Children: N/A  . Years of Education: N/A   Occupational History  . Not on file.   Social History Main Topics  . Smoking status: Former Smoker -- 1.5 packs/day for 40 years    Types: Cigarettes, Pipe    Quit date: 03/06/1985  . Smokeless tobacco: Not on file  . Alcohol Use: No  . Drug Use: No  . Sexually Active: Not on file   Other Topics Concern  . Not on file   Social  History Narrative  . No narrative on file     BP 120/82  Pulse 60  Ht 5\' 9"  (1.753 m)  Wt 89.812 kg (198 lb)  BMI 29.24 kg/m2  Physical Exam:  Well appearing 75 year old man, NAD HEENT: Unremarkable Neck:  No JVD, no thyromegally Lymphatics:  No adenopathy Back:  No CVA tenderness Lungs:  Clear with no wheezes, rales, or rhonchi. Well-healed ICD incision. HEART:  Regular rate rhythm, no murmurs, no rubs, no clicks Abd:  soft, positive bowel sounds, no organomegally, no rebound, no guarding Ext:  2 plus pulses, no edema, no cyanosis, no clubbing Skin:  No rashes no nodules Neuro:  CN II through XII intact, motor grossly intact  DEVICE  Normal device function.  See PaceArt for details.   Assess/Plan:

## 2011-03-07 HISTORY — PX: MASS BIOPSY: SHX5445

## 2011-04-19 ENCOUNTER — Other Ambulatory Visit: Payer: Self-pay | Admitting: Cardiology

## 2011-05-01 ENCOUNTER — Ambulatory Visit (INDEPENDENT_AMBULATORY_CARE_PROVIDER_SITE_OTHER): Payer: Medicare Other | Admitting: Cardiology

## 2011-05-01 ENCOUNTER — Other Ambulatory Visit: Payer: Medicare Other

## 2011-05-01 ENCOUNTER — Encounter: Payer: Self-pay | Admitting: Cardiology

## 2011-05-01 DIAGNOSIS — I4891 Unspecified atrial fibrillation: Secondary | ICD-10-CM

## 2011-05-01 DIAGNOSIS — E119 Type 2 diabetes mellitus without complications: Secondary | ICD-10-CM

## 2011-05-01 DIAGNOSIS — R21 Rash and other nonspecific skin eruption: Secondary | ICD-10-CM

## 2011-05-01 DIAGNOSIS — I25810 Atherosclerosis of coronary artery bypass graft(s) without angina pectoris: Secondary | ICD-10-CM

## 2011-05-01 DIAGNOSIS — N259 Disorder resulting from impaired renal tubular function, unspecified: Secondary | ICD-10-CM

## 2011-05-01 MED ORDER — DIGOXIN 250 MCG PO TABS: 250.0000 ug | ORAL_TABLET | Freq: Every day | ORAL | Status: DC

## 2011-05-01 MED ORDER — BENAZEPRIL HCL 20 MG PO TABS: 20.0000 mg | ORAL_TABLET | Freq: Every day | ORAL | Status: DC

## 2011-05-01 MED ORDER — FUROSEMIDE 40 MG PO TABS: 40.0000 mg | ORAL_TABLET | Freq: Two times a day (BID) | ORAL | Status: DC

## 2011-05-01 NOTE — Assessment & Plan Note (Signed)
He is maintaining NSR at present.  Doing well overall. Continue current meds.

## 2011-05-01 NOTE — Assessment & Plan Note (Signed)
May need some insulin.

## 2011-05-01 NOTE — Assessment & Plan Note (Signed)
No recurrent angina 

## 2011-05-01 NOTE — Progress Notes (Signed)
HPI:  He is doing ok.  He still has some rashes, and occasional pustular lesions.  Unfortunately, his sugars also remain high and won't come down.   Current Outpatient Prescriptions  Medication Sig Dispense Refill  . aspirin 81 MG tablet Take 81 mg by mouth daily.        . benazepril (LOTENSIN) 20 MG tablet TAKE 1 TABLET BY MOUTH EVERY DAY  90 tablet  4  . digoxin (LANOXIN) 0.25 MG tablet Take 250 mcg by mouth daily.        Marland Kitchen dofetilide (TIKOSYN) 500 MCG capsule Take 1 capsule (500 mcg total) by mouth 2 (two) times daily.  180 capsule  3  . fluticasone (FLONASE) 50 MCG/ACT nasal spray Place 1 spray into the nose as needed.       . furosemide (LASIX) 40 MG tablet Take 40 mg by mouth 2 (two) times daily.        Marland Kitchen glimepiride (AMARYL) 4 MG tablet Take 4 mg by mouth daily before breakfast.        . isosorbide dinitrate (ISORDIL) 30 MG tablet Take 30 mg by mouth daily. Taking 1/2 tablet in the am and a 1/2 in the pm      . metoprolol (LOPRESSOR) 50 MG tablet 1/2 tab po bid      . potassium chloride SA (K-DUR,KLOR-CON) 20 MEQ tablet Take 20 mEq by mouth daily. Taking 1 1/2 tablets daily      . saxagliptin HCl (ONGLYZA) 2.5 MG TABS tablet Take 5 mg by mouth daily.      . triazolam (HALCION) 0.25 MG tablet Take 0.25 mg by mouth at bedtime as needed.          Allergies  Allergen Reactions  . Ativan   . Codeine     Past Medical History  Diagnosis Date  . Atrial fibrillation   . CAD, ARTERY BYPASS GRAFT   . CARDIOMYOPATHY, ISCHEMIC   . Chronic systolic heart failure   . DIABETES MELLITUS   . Dysuria   . HYPERLIPIDEMIA-MIXED   . ICD - IN SITU   . RENAL INSUFFICIENCY     Past Surgical History  Procedure Date  . Coronary artery bypass graft   . Redo cabg   . Mitral valve repair     with an angioplasty ring  . Inguinal hernia repair   . Cervical discectomy     C3-C4  . Cardiac defibrillator placement     Guidant    Family History  Problem Relation Age of Onset  . Hypertension  Other     History   Social History  . Marital Status: Widowed    Spouse Name: N/A    Number of Children: N/A  . Years of Education: N/A   Occupational History  . Not on file.   Social History Main Topics  . Smoking status: Former Smoker -- 1.5 packs/day for 40 years    Types: Cigarettes, Pipe    Quit date: 03/06/1985  . Smokeless tobacco: Not on file  . Alcohol Use: No  . Drug Use: No  . Sexually Active: Not on file   Other Topics Concern  . Not on file   Social History Narrative  . No narrative on file    ROS: Please see the HPI.  All other systems reviewed and negative.  PHYSICAL EXAM:  BP 132/84  Pulse 60  Ht 5\' 9"  (1.753 m)  Wt 201 lb 1.9 oz (91.227 kg)  BMI 29.70 kg/m2  General: Well developed, well nourished, in no acute distress. Head:  Normocephalic and atraumatic. Neck: no JVD Lungs: Clear to auscultation and percussion. Heart: Normal S1 and S2.  No murmur, rubs or gallops. Prom S4 gallop.  Not much in the way of MR murmur.   Abdomen:  Normal bowel sounds; soft; non tender; no organomegaly Pulses: Pulses normal in all 4 extremities. Extremities: No clubbing or cyanosis. No edema. Neurologic: Alert and oriented x 3.  EKG:  Av pacing.    ASSESSMENT AND PLAN:

## 2011-05-01 NOTE — Assessment & Plan Note (Signed)
Mostly flat papules.  Some better, but ? Med related.  Continue with current therapies.  Check Dig level.

## 2011-05-01 NOTE — Patient Instructions (Addendum)
Your physician recommends that you have lab work: BMP, Digoxin--please have this drawn in the [place] office on Tuesday 05/02/11---arrive after 2:00  Your physician recommends that you schedule a follow-up appointment in: 3 MONTHS with Dr [name]  Your physician recommends that you continue on your current medications as directed. Please refer to the Current Medication list given to you today.

## 2011-05-01 NOTE — Assessment & Plan Note (Signed)
Recheck cr, and dig level.

## 2011-05-02 ENCOUNTER — Other Ambulatory Visit (INDEPENDENT_AMBULATORY_CARE_PROVIDER_SITE_OTHER): Payer: Medicare Other

## 2011-05-02 DIAGNOSIS — I25810 Atherosclerosis of coronary artery bypass graft(s) without angina pectoris: Secondary | ICD-10-CM

## 2011-05-02 DIAGNOSIS — I4891 Unspecified atrial fibrillation: Secondary | ICD-10-CM

## 2011-05-02 DIAGNOSIS — N259 Disorder resulting from impaired renal tubular function, unspecified: Secondary | ICD-10-CM

## 2011-05-02 DIAGNOSIS — E119 Type 2 diabetes mellitus without complications: Secondary | ICD-10-CM

## 2011-05-02 DIAGNOSIS — R21 Rash and other nonspecific skin eruption: Secondary | ICD-10-CM

## 2011-05-03 LAB — BASIC METABOLIC PANEL
BUN: 26 mg/dL (ref 8–27)
CO2: 22 mmol/L (ref 20–32)
Calcium: 9.2 mg/dL (ref 8.6–10.2)
Creatinine, Ser: 1.21 mg/dL (ref 0.76–1.27)

## 2011-08-01 ENCOUNTER — Encounter: Payer: Self-pay | Admitting: Cardiology

## 2011-08-01 ENCOUNTER — Ambulatory Visit (INDEPENDENT_AMBULATORY_CARE_PROVIDER_SITE_OTHER): Payer: Medicare Other | Admitting: Cardiology

## 2011-08-01 VITALS — BP 109/69 | HR 60 | Resp 18 | Ht 72.0 in | Wt 197.8 lb

## 2011-08-01 DIAGNOSIS — I2510 Atherosclerotic heart disease of native coronary artery without angina pectoris: Secondary | ICD-10-CM

## 2011-08-01 DIAGNOSIS — I2589 Other forms of chronic ischemic heart disease: Secondary | ICD-10-CM

## 2011-08-01 DIAGNOSIS — N259 Disorder resulting from impaired renal tubular function, unspecified: Secondary | ICD-10-CM

## 2011-08-01 DIAGNOSIS — R21 Rash and other nonspecific skin eruption: Secondary | ICD-10-CM

## 2011-08-01 DIAGNOSIS — E785 Hyperlipidemia, unspecified: Secondary | ICD-10-CM

## 2011-08-01 DIAGNOSIS — I4891 Unspecified atrial fibrillation: Secondary | ICD-10-CM

## 2011-08-01 LAB — BASIC METABOLIC PANEL
Chloride: 105 mEq/L (ref 96–112)
Potassium: 4.6 mEq/L (ref 3.5–5.1)
Sodium: 138 mEq/L (ref 135–145)

## 2011-08-01 NOTE — Assessment & Plan Note (Signed)
Not sure the etiology of this.  It sounds almost like infected pustules that resolve.  Non is currently active.

## 2011-08-01 NOTE — Progress Notes (Signed)
HPI:   The patient returns in followup. He had a difficult 2 weeks. For about 4 days he felt his kidneys shut down. He also could not have a bowel movement. This eventually resolved. He also had some tightness in the throat, and has simultaneous sinus infection. Also said difficulty with breaking out. He's developed small hard lumps that he says contained pus. He puts Neosporin on them and they resolved. He is seeing a dermatologist, and a variety of creams been prescribed. He says nothing seems to help other than the antibiotic ointment.  He now is feeling much better. He is in the process of switching his primary care to Dr. Juanetta Gosling.  Current Outpatient Prescriptions  Medication Sig Dispense Refill  . aspirin 81 MG tablet Take 81 mg by mouth daily.        . benazepril (LOTENSIN) 20 MG tablet Take 1 tablet (20 mg total) by mouth daily.  90 tablet  3  . digoxin (LANOXIN) 0.25 MG tablet Take 1 tablet (250 mcg total) by mouth daily.  90 tablet  3  . dofetilide (TIKOSYN) 500 MCG capsule Take 1 capsule (500 mcg total) by mouth 2 (two) times daily.  180 capsule  3  . fluticasone (FLONASE) 50 MCG/ACT nasal spray Place 1 spray into the nose as needed.       . furosemide (LASIX) 40 MG tablet Take 1 tablet (40 mg total) by mouth 2 (two) times daily.  180 tablet  3  . glimepiride (AMARYL) 4 MG tablet Take 4 mg by mouth 2 (two) times daily before a meal.       . isosorbide dinitrate (ISORDIL) 30 MG tablet Take 30 mg by mouth daily. Taking 1/2 tablet in the am and a 1/2 in the pm      . metFORMIN (GLUCOPHAGE) 500 MG tablet Take 500 mg by mouth daily.      . metoprolol (LOPRESSOR) 50 MG tablet 1/2 tab po bid      . potassium chloride SA (K-DUR,KLOR-CON) 20 MEQ tablet Take 20 mEq by mouth daily. Taking 1 1/2 tablets daily      . saxagliptin HCl (ONGLYZA) 2.5 MG TABS tablet Take 5 mg by mouth daily.      . triazolam (HALCION) 0.25 MG tablet Take 0.25 mg by mouth at bedtime as needed.          Allergies    Allergen Reactions  . Codeine   . Lorazepam     Past Medical History  Diagnosis Date  . Atrial fibrillation   . CAD, ARTERY BYPASS GRAFT   . CARDIOMYOPATHY, ISCHEMIC   . Chronic systolic heart failure   . DIABETES MELLITUS   . Dysuria   . HYPERLIPIDEMIA-MIXED   . ICD - IN SITU   . RENAL INSUFFICIENCY     Past Surgical History  Procedure Date  . Coronary artery bypass graft   . Redo cabg   . Mitral valve repair     with an angioplasty ring  . Inguinal hernia repair   . Cervical discectomy     C3-C4  . Cardiac defibrillator placement     Guidant    Family History  Problem Relation Age of Onset  . Hypertension Other     History   Social History  . Marital Status: Widowed    Spouse Name: N/A    Number of Children: N/A  . Years of Education: N/A   Occupational History  . Not on file.   Social History  Main Topics  . Smoking status: Former Smoker -- 1.5 packs/day for 40 years    Types: Cigarettes, Pipe    Quit date: 03/06/1985  . Smokeless tobacco: Not on file  . Alcohol Use: No  . Drug Use: No  . Sexually Active: Not on file   Other Topics Concern  . Not on file   Social History Narrative  . No narrative on file    ROS: Please see the HPI.  All other systems reviewed and negative.  PHYSICAL EXAM:  BP 109/69  Pulse 60  Resp 18  Ht 6' (1.829 m)  Wt 197 lb 12.8 oz (89.721 kg)  BMI 26.83 kg/m2  General: Well developed, well nourished, in no acute distress. Head:  Normocephalic and atraumatic. Neck: no JVD Lungs: Clear to auscultation and percussion. Heart: Normal S1 and S2. Pos S4.  Soft apical murmur.   Abdomen:  Normal bowel sounds; soft; non tender; no organomegaly Pulses: Pulses normal in all 4 extremities. Extremities: No clubbing or cyanosis. No edema.  Healing lesion on right lower leg Neurologic: Alert and oriented x 3. Skin:  Small groin lesions all in healing.   EKG:  Atrial tracking and ventricular pacing.    ASSESSMENT AND  PLAN:

## 2011-08-01 NOTE — Assessment & Plan Note (Signed)
Maintaining NSR on dofetilide.

## 2011-08-01 NOTE — Assessment & Plan Note (Signed)
He appears stable from this now, but we will get some labs today.

## 2011-08-01 NOTE — Patient Instructions (Signed)
Your physician recommends that you have lab work today: BMP  Your physician recommends that you schedule a follow-up appointment in: 4-6 WEEKS with Dr Stuckey  Your physician recommends that you continue on your current medications as directed. Please refer to the Current Medication list given to you today.  

## 2011-08-01 NOTE — Assessment & Plan Note (Signed)
Need to monitor. 

## 2011-08-01 NOTE — Assessment & Plan Note (Signed)
Will not take statins.

## 2011-08-10 ENCOUNTER — Telehealth: Payer: Self-pay | Admitting: Internal Medicine

## 2011-08-10 NOTE — Telephone Encounter (Signed)
Pt scheduled for 08-11-11 with Dr [name].

## 2011-08-10 NOTE — Telephone Encounter (Signed)
Pt came into the office stating that his device has been beeping since last Thursday. Wants to know if he needs to have it checked.

## 2011-08-11 ENCOUNTER — Encounter: Payer: Self-pay | Admitting: Internal Medicine

## 2011-08-11 ENCOUNTER — Ambulatory Visit (INDEPENDENT_AMBULATORY_CARE_PROVIDER_SITE_OTHER): Payer: Medicare Other | Admitting: Internal Medicine

## 2011-08-11 ENCOUNTER — Encounter: Payer: Self-pay | Admitting: *Deleted

## 2011-08-11 VITALS — BP 126/80 | HR 70 | Ht 69.0 in | Wt 201.8 lb

## 2011-08-11 DIAGNOSIS — I4891 Unspecified atrial fibrillation: Secondary | ICD-10-CM

## 2011-08-11 DIAGNOSIS — I2589 Other forms of chronic ischemic heart disease: Secondary | ICD-10-CM

## 2011-08-11 DIAGNOSIS — Z95810 Presence of automatic (implantable) cardiac defibrillator: Secondary | ICD-10-CM

## 2011-08-11 DIAGNOSIS — I5022 Chronic systolic (congestive) heart failure: Secondary | ICD-10-CM

## 2011-08-11 LAB — ICD DEVICE OBSERVATION
AL AMPLITUDE: 2.1 mv
AL IMPEDENCE ICD: 583 Ohm
BAMS-0002: 8 ms
DEV-0020ICD: NEGATIVE
DEVICE MODEL ICD: 316804
HV IMPEDENCE: 44 Ohm
RV LEAD AMPLITUDE: 21.6 mv
TZAT-0001SLOWVT: 1
TZAT-0012FASTVT: 200 ms
TZAT-0013FASTVT: 2
TZAT-0013FASTVT: 2
TZAT-0018FASTVT: NEGATIVE
TZAT-0018SLOWVT: NEGATIVE
TZAT-0018SLOWVT: NEGATIVE
TZAT-0019FASTVT: 7.5 V
TZAT-0019FASTVT: 7.5 V
TZAT-0020FASTVT: 1 ms
TZAT-0020FASTVT: 1 ms
TZON-0003FASTVT: 353 ms
TZON-0004FASTVT: 2.5
TZON-0005SLOWVT: 1
TZST-0001FASTVT: 3
TZST-0001FASTVT: 5
TZST-0003FASTVT: 21 J
TZST-0003FASTVT: 31 J
VENTRICULAR PACING ICD: 99 pct

## 2011-08-11 NOTE — Patient Instructions (Signed)
Continue current medications as listed.  You are being scheduled for a generator change out on September 06, 2011.  You will be called with instructions and an appointment time.

## 2011-08-11 NOTE — Assessment & Plan Note (Signed)
He denies anginal symptoms. He will continue his current medical therapy. 

## 2011-08-11 NOTE — Assessment & Plan Note (Signed)
His heart failure symptoms are class IIb. He will continue his current medical therapy and maintain a low-sodium diet.

## 2011-08-11 NOTE — Assessment & Plan Note (Signed)
His device is working normally. It has reached elective replacement. We will schedule ICD generator change in the next several weeks. I should note that previously, the patient had an allergic reaction to benzodiazepines. We will plan to hold these.

## 2011-08-11 NOTE — Progress Notes (Signed)
HPI Mr. Nettleton returns today for followup. He is a very pleasant 76 year old man with an ischemic cardiomyopathy, atrial fibrillation, chronic systolic heart failure, status post biventricular ICD implantation. He also has a history of ventricular tachycardia. He denies chest pain or shortness of breath. He feels weak and fatigued at times. He has had no recent ICD shock. No syncope. Minimal peripheral edema. Allergies  Allergen Reactions  . Codeine   . Lorazepam      Current Outpatient Prescriptions  Medication Sig Dispense Refill  . aspirin 81 MG tablet Take 81 mg by mouth daily.        . benazepril (LOTENSIN) 20 MG tablet Take 1 tablet (20 mg total) by mouth daily.  90 tablet  3  . dofetilide (TIKOSYN) 500 MCG capsule Take 1 capsule (500 mcg total) by mouth 2 (two) times daily.  180 capsule  3  . fluticasone (FLONASE) 50 MCG/ACT nasal spray Place 1 spray into the nose as needed.       . furosemide (LASIX) 40 MG tablet Take 1 tablet (40 mg total) by mouth 2 (two) times daily.  180 tablet  3  . glimepiride (AMARYL) 4 MG tablet Take 4 mg by mouth 2 (two) times daily before a meal.       . isosorbide dinitrate (ISORDIL) 30 MG tablet Take 30 mg by mouth daily. Taking 1/2 tablet in the am and a 1/2 in the pm      . metFORMIN (GLUCOPHAGE) 500 MG tablet Take 500 mg by mouth daily.       . metoprolol (LOPRESSOR) 50 MG tablet 1/2 tab po bid      . potassium chloride SA (K-DUR,KLOR-CON) 20 MEQ tablet Take 20 mEq by mouth daily. Taking 1 1/2 tablets daily      . saxagliptin HCl (ONGLYZA) 2.5 MG TABS tablet Take 5 mg by mouth daily.      . triazolam (HALCION) 0.25 MG tablet Take 0.25 mg by mouth at bedtime as needed.           Past Medical History  Diagnosis Date  . Atrial fibrillation   . CAD, ARTERY BYPASS GRAFT   . CARDIOMYOPATHY, ISCHEMIC   . Chronic systolic heart failure   . DIABETES MELLITUS   . Dysuria   . HYPERLIPIDEMIA-MIXED   . ICD - IN SITU   . RENAL INSUFFICIENCY      ROS:   All systems reviewed and negative except as noted in the HPI.   Past Surgical History  Procedure Date  . Coronary artery bypass graft   . Redo cabg   . Mitral valve repair     with an angioplasty ring  . Inguinal hernia repair   . Cervical discectomy     C3-C4  . Cardiac defibrillator placement     Guidant     Family History  Problem Relation Age of Onset  . Hypertension Other      History   Social History  . Marital Status: Widowed    Spouse Name: N/A    Number of Children: N/A  . Years of Education: N/A   Occupational History  . Not on file.   Social History Main Topics  . Smoking status: Former Smoker -- 1.5 packs/day for 40 years    Types: Cigarettes, Pipe    Quit date: 03/06/1985  . Smokeless tobacco: Not on file  . Alcohol Use: No  . Drug Use: No  . Sexually Active: Not on file   Other Topics  Concern  . Not on file   Social History Narrative  . No narrative on file     BP 126/80  Pulse 70  Ht 5\' 9"  (1.753 m)  Wt 201 lb 12.8 oz (91.536 kg)  BMI 29.80 kg/m2  Physical Exam:  Well appearing 76 year old man, NAD HEENT: Unremarkable Neck:  No JVD, no thyromegally Lungs:  Clear with no wheezes, rales, or rhonchi. HEART:  Regular rate rhythm, no murmurs, no rubs, no clicks Abd:  soft, positive bowel sounds, no organomegally, no rebound, no guarding Ext:  2 plus pulses, no edema, no cyanosis, no clubbing Skin:  No rashes no nodules Neuro:  CN II through XII intact, motor grossly intact  EKG Atrial fibrillation with ventricular pacing DEVICE  Normal device function.  See PaceArt for details. Device at elective replacement.  Assess/Plan:

## 2011-08-12 ENCOUNTER — Telehealth: Payer: Self-pay | Admitting: Physician Assistant

## 2011-08-12 NOTE — Telephone Encounter (Signed)
Patient's son called this afternoon to report that his dad was experiencing throat pain and feels pale and weak. These symptoms are similar to what he had with prior angina. I advised he proceed to the ER for evaluation and the son unexpectedly escalated with an angry yelling verbal tone about his dad having to go through throat cultures in the ER in the past before undergoing cardiology evaluation. He demanded that "I don't care if Dr. [name] is in the [place]" that he needs to be involved, and he wanted to know exactly the plan as soon as they got to [facility]. He said if they show up at the ER and "go through that again, I'm going to have the law involved." I told them I would be able to notify the on-call doctor to see if he can see them instead of the ER physician first. I offered apologies that they've had to be evaluated for other ailments in the past. However, the patient's son continued to yell about prior situations and I told him that it might be best to proceed now instead of staying on the phone, and calmly asked him not to raise his voice at me but he continued to yell. I offered for them to speak with the physician on call that would likely be seeing them in the ER and he accepted. I passed this information onto Dr. Maryelizabeth Kaufmann, fellow on call for our group. Kavir Savoca PA-C

## 2011-08-12 NOTE — Telephone Encounter (Signed)
See prior phone note. Patient's son called back to say that his dad is now refusing to go the ER. He states they got in a big argument and his dad will not listen to anything he is saying and left. He wanted to let us know, and wanted to know if we could try to call his dad to remind him of our recommendations. He states his dad is in sound state of mind right now. I reminded him that there's only so much we can do if we decides not to go to the hospital and attempted to call the patient but he did not answer. The patient's son says he will continue to try to talk to him but the patient apparently stated he will come to the hospital tomorrow. Tashona Calk PA-C

## 2011-08-14 ENCOUNTER — Encounter (HOSPITAL_COMMUNITY)
Admission: EM | Disposition: A | Discharge status: Home / Self Care | Payer: Self-pay | Source: Home / Self Care | Attending: Cardiology

## 2011-08-14 ENCOUNTER — Encounter (HOSPITAL_COMMUNITY): Payer: Self-pay | Admitting: Emergency Medicine

## 2011-08-14 ENCOUNTER — Telehealth: Payer: Self-pay | Admitting: Cardiology

## 2011-08-14 ENCOUNTER — Emergency Department (HOSPITAL_COMMUNITY): Payer: Medicare Other

## 2011-08-14 ENCOUNTER — Inpatient Hospital Stay (HOSPITAL_COMMUNITY)
Admission: EM | Admit: 2011-08-14 | Discharge: 2011-08-20 | DRG: 281 | Disposition: A | Discharge status: Home / Self Care | Payer: Medicare Other | Attending: Cardiology | Admitting: Cardiology

## 2011-08-14 DIAGNOSIS — J449 Chronic obstructive pulmonary disease, unspecified: Secondary | ICD-10-CM | POA: Diagnosis present

## 2011-08-14 DIAGNOSIS — I5022 Chronic systolic (congestive) heart failure: Secondary | ICD-10-CM

## 2011-08-14 DIAGNOSIS — E785 Hyperlipidemia, unspecified: Secondary | ICD-10-CM | POA: Diagnosis present

## 2011-08-14 DIAGNOSIS — I2510 Atherosclerotic heart disease of native coronary artery without angina pectoris: Secondary | ICD-10-CM

## 2011-08-14 DIAGNOSIS — I25810 Atherosclerosis of coronary artery bypass graft(s) without angina pectoris: Secondary | ICD-10-CM | POA: Diagnosis present

## 2011-08-14 DIAGNOSIS — Z9861 Coronary angioplasty status: Secondary | ICD-10-CM

## 2011-08-14 DIAGNOSIS — I4891 Unspecified atrial fibrillation: Secondary | ICD-10-CM | POA: Diagnosis present

## 2011-08-14 DIAGNOSIS — I10 Essential (primary) hypertension: Secondary | ICD-10-CM | POA: Diagnosis present

## 2011-08-14 DIAGNOSIS — Z95810 Presence of automatic (implantable) cardiac defibrillator: Secondary | ICD-10-CM

## 2011-08-14 DIAGNOSIS — Z87891 Personal history of nicotine dependence: Secondary | ICD-10-CM

## 2011-08-14 DIAGNOSIS — E119 Type 2 diabetes mellitus without complications: Secondary | ICD-10-CM | POA: Diagnosis present

## 2011-08-14 DIAGNOSIS — I509 Heart failure, unspecified: Secondary | ICD-10-CM | POA: Diagnosis present

## 2011-08-14 DIAGNOSIS — I2589 Other forms of chronic ischemic heart disease: Secondary | ICD-10-CM | POA: Diagnosis present

## 2011-08-14 DIAGNOSIS — I428 Other cardiomyopathies: Secondary | ICD-10-CM | POA: Diagnosis present

## 2011-08-14 DIAGNOSIS — I214 Non-ST elevation (NSTEMI) myocardial infarction: Principal | ICD-10-CM

## 2011-08-14 DIAGNOSIS — J4489 Other specified chronic obstructive pulmonary disease: Secondary | ICD-10-CM | POA: Diagnosis present

## 2011-08-14 HISTORY — DX: Nonrheumatic mitral (valve) insufficiency: I34.0

## 2011-08-14 HISTORY — DX: Presence of automatic (implantable) cardiac defibrillator: Z95.810

## 2011-08-14 HISTORY — PX: LEFT HEART CATHETERIZATION WITH CORONARY/GRAFT ANGIOGRAM: SHX5450

## 2011-08-14 HISTORY — DX: Chronic obstructive pulmonary disease, unspecified: J44.9

## 2011-08-14 HISTORY — DX: Essential (primary) hypertension: I10

## 2011-08-14 LAB — CBC
HCT: 39.1 % (ref 39.0–52.0)
Hemoglobin: 13.8 g/dL (ref 13.0–17.0)
Hemoglobin: 13 g/dL (ref 13.0–17.0)
MCH: 31.6 pg (ref 26.0–34.0)
MCHC: 33.7 g/dL (ref 30.0–36.0)
MCHC: 33.2 g/dL (ref 30.0–36.0)
MCV: 93.8 fL (ref 78.0–100.0)
MCV: 94 fL (ref 78.0–100.0)
Platelets: 123 10*3/uL — ABNORMAL LOW (ref 150–400)
RBC: 4.37 MIL/uL (ref 4.22–5.81)
RDW: 15.8 % — ABNORMAL HIGH (ref 11.5–15.5)

## 2011-08-14 LAB — COMPREHENSIVE METABOLIC PANEL
CO2: 23 mEq/L (ref 19–32)
Calcium: 9.3 mg/dL (ref 8.4–10.5)
Creatinine, Ser: 1.24 mg/dL (ref 0.50–1.35)
GFR calc Af Amer: 65 mL/min — ABNORMAL LOW (ref >90)
GFR calc non Af Amer: 56 mL/min — ABNORMAL LOW (ref >90)
Glucose, Bld: 428 mg/dL — ABNORMAL HIGH (ref 70–99)
Sodium: 132 mEq/L — ABNORMAL LOW (ref 135–145)
Total Protein: 7 g/dL (ref 6.0–8.3)

## 2011-08-14 LAB — POCT I-STAT TROPONIN I: Troponin i, poc: 1.84 ng/mL (ref 0.00–0.08)

## 2011-08-14 LAB — GLUCOSE, CAPILLARY: Glucose-Capillary: 260 mg/dL — ABNORMAL HIGH (ref 70–99)

## 2011-08-14 LAB — MRSA PCR SCREENING: MRSA by PCR: POSITIVE — AB

## 2011-08-14 SURGERY — LEFT HEART CATHETERIZATION WITH CORONARY/GRAFT ANGIOGRAM

## 2011-08-14 MED ORDER — BENAZEPRIL HCL 20 MG PO TABS
20.0000 mg | ORAL_TABLET | Freq: Every day | ORAL | Status: DC
  Administered 2011-08-15 – 2011-08-19 (×5): 20 mg via ORAL
  Filled 2011-08-14 (×6): qty 1

## 2011-08-14 MED ORDER — ONDANSETRON HCL 4 MG/2ML IJ SOLN: 4.0000 mg | Freq: Four times a day (QID) | INTRAMUSCULAR | Status: DC | PRN

## 2011-08-14 MED ORDER — INSULIN REGULAR BOLUS VIA INFUSION
0.0000 [IU] | Freq: Three times a day (TID) | INTRAVENOUS | Status: DC
  Administered 2011-08-15: 3 [IU] via INTRAVENOUS
  Filled 2011-08-14: qty 10

## 2011-08-14 MED ORDER — METOPROLOL TARTRATE 25 MG PO TABS
25.0000 mg | ORAL_TABLET | Freq: Two times a day (BID) | ORAL | Status: DC
  Administered 2011-08-14 – 2011-08-19 (×10): 25 mg via ORAL
  Filled 2011-08-14 (×13): qty 1

## 2011-08-14 MED ORDER — INSULIN ASPART 100 UNIT/ML ~~LOC~~ SOLN: 0.0000 [IU] | Freq: Every day | SUBCUTANEOUS | Status: DC

## 2011-08-14 MED ORDER — ASPIRIN 81 MG PO TABS: 81.0000 mg | ORAL_TABLET | Freq: Every day | ORAL | Status: DC

## 2011-08-14 MED ORDER — DOFETILIDE 500 MCG PO CAPS
500.0000 ug | ORAL_CAPSULE | Freq: Two times a day (BID) | ORAL | Status: DC
  Administered 2011-08-14 – 2011-08-19 (×8): 500 ug via ORAL
  Filled 2011-08-14 (×13): qty 1

## 2011-08-14 MED ORDER — HEPARIN (PORCINE) IN NACL 2-0.9 UNIT/ML-% IJ SOLN
INTRAMUSCULAR | Status: AC
  Filled 2011-08-14: qty 2000

## 2011-08-14 MED ORDER — MORPHINE SULFATE 4 MG/ML IJ SOLN
4.0000 mg | Freq: Once | INTRAMUSCULAR | Status: AC
  Administered 2011-08-14: 4 mg via INTRAVENOUS
  Filled 2011-08-14: qty 1

## 2011-08-14 MED ORDER — ATORVASTATIN CALCIUM 40 MG PO TABS
40.0000 mg | ORAL_TABLET | Freq: Every day | ORAL | Status: DC
  Administered 2011-08-15 – 2011-08-19 (×5): 40 mg via ORAL
  Filled 2011-08-14 (×6): qty 1

## 2011-08-14 MED ORDER — INSULIN ASPART 100 UNIT/ML ~~LOC~~ SOLN: 0.0000 [IU] | Freq: Three times a day (TID) | SUBCUTANEOUS | Status: DC

## 2011-08-14 MED ORDER — DEXTROSE 50 % IV SOLN: 25.0000 mL | INTRAVENOUS | Status: DC | PRN

## 2011-08-14 MED ORDER — ZOLPIDEM TARTRATE 5 MG PO TABS
5.0000 mg | ORAL_TABLET | Freq: Every evening | ORAL | Status: DC | PRN
  Administered 2011-08-15 – 2011-08-19 (×5): 5 mg via ORAL
  Filled 2011-08-14 (×5): qty 1

## 2011-08-14 MED ORDER — ACETAMINOPHEN 325 MG PO TABS: 650.0000 mg | ORAL_TABLET | ORAL | Status: DC | PRN

## 2011-08-14 MED ORDER — SODIUM CHLORIDE 0.9 % IV SOLN: 250.0000 mL | INTRAVENOUS | Status: DC | PRN

## 2011-08-14 MED ORDER — SODIUM CHLORIDE 0.9 % IJ SOLN: 3.0000 mL | INTRAMUSCULAR | Status: DC | PRN

## 2011-08-14 MED ORDER — MIDAZOLAM HCL 2 MG/2ML IJ SOLN
INTRAMUSCULAR | Status: AC
  Filled 2011-08-14: qty 2

## 2011-08-14 MED ORDER — SODIUM CHLORIDE 0.9 % IV SOLN: INTRAVENOUS | Status: AC

## 2011-08-14 MED ORDER — INSULIN ASPART 100 UNIT/ML ~~LOC~~ SOLN: 3.0000 [IU] | Freq: Three times a day (TID) | SUBCUTANEOUS | Status: DC

## 2011-08-14 MED ORDER — SODIUM CHLORIDE 0.9 % IV SOLN
INTRAVENOUS | Status: DC
  Filled 2011-08-14: qty 1

## 2011-08-14 MED ORDER — ASPIRIN EC 81 MG PO TBEC
81.0000 mg | DELAYED_RELEASE_TABLET | Freq: Every day | ORAL | Status: DC
  Administered 2011-08-15 – 2011-08-19 (×5): 81 mg via ORAL
  Filled 2011-08-14 (×6): qty 1

## 2011-08-14 MED ORDER — HEPARIN SODIUM (PORCINE) 5000 UNIT/ML IJ SOLN
INTRAMUSCULAR | Status: AC
  Administered 2011-08-14: 4000 [IU] via INTRAVENOUS
  Filled 2011-08-14: qty 1

## 2011-08-14 MED ORDER — LIDOCAINE HCL (PF) 1 % IJ SOLN
INTRAMUSCULAR | Status: AC
  Filled 2011-08-14: qty 30

## 2011-08-14 MED ORDER — SODIUM CHLORIDE 0.9 % IV SOLN: INTRAVENOUS | Status: DC

## 2011-08-14 MED ORDER — MUPIROCIN 2 % EX OINT
TOPICAL_OINTMENT | Freq: Two times a day (BID) | CUTANEOUS | Status: DC
  Administered 2011-08-14: 1 via NASAL
  Administered 2011-08-15 – 2011-08-20 (×11): via NASAL
  Filled 2011-08-14: qty 22

## 2011-08-14 MED ORDER — HEPARIN BOLUS VIA INFUSION
4000.0000 [IU] | Freq: Once | INTRAVENOUS | Status: AC
  Administered 2011-08-14: 4000 [IU] via INTRAVENOUS

## 2011-08-14 MED ORDER — ASPIRIN 81 MG PO CHEW
243.0000 mg | CHEWABLE_TABLET | Freq: Once | ORAL | Status: AC
  Administered 2011-08-14: 243 mg via ORAL
  Filled 2011-08-14: qty 3

## 2011-08-14 MED ORDER — INSULIN ASPART 100 UNIT/ML ~~LOC~~ SOLN
3.0000 [IU] | Freq: Three times a day (TID) | SUBCUTANEOUS | Status: DC
Start: 2011-08-15 — End: 2011-08-20
  Administered 2011-08-15 – 2011-08-19 (×12): 3 [IU] via SUBCUTANEOUS

## 2011-08-14 MED ORDER — SODIUM CHLORIDE 0.9 % IJ SOLN
3.0000 mL | Freq: Two times a day (BID) | INTRAMUSCULAR | Status: DC
  Administered 2011-08-15 – 2011-08-16 (×3): 3 mL via INTRAVENOUS

## 2011-08-14 MED ORDER — NITROGLYCERIN 0.2 MG/ML ON CALL CATH LAB
INTRAVENOUS | Status: AC
  Filled 2011-08-14: qty 1

## 2011-08-14 MED ORDER — CLOPIDOGREL BISULFATE 300 MG PO TABS
ORAL_TABLET | ORAL | Status: AC
  Filled 2011-08-14: qty 1

## 2011-08-14 MED ORDER — ENOXAPARIN SODIUM 40 MG/0.4ML ~~LOC~~ SOLN
40.0000 mg | SUBCUTANEOUS | Status: DC
  Administered 2011-08-15 – 2011-08-20 (×6): 40 mg via SUBCUTANEOUS
  Filled 2011-08-14 (×8): qty 0.4

## 2011-08-14 MED ORDER — ENOXAPARIN SODIUM 40 MG/0.4ML ~~LOC~~ SOLN: 40.0000 mg | SUBCUTANEOUS | Status: DC

## 2011-08-14 MED ORDER — CLOPIDOGREL BISULFATE 300 MG PO TABS
300.0000 mg | ORAL_TABLET | Freq: Once | ORAL | Status: AC
  Administered 2011-08-14: 300 mg via ORAL

## 2011-08-14 MED ORDER — CHLORHEXIDINE GLUCONATE CLOTH 2 % EX PADS
6.0000 | MEDICATED_PAD | Freq: Every day | CUTANEOUS | Status: DC
  Administered 2011-08-15 – 2011-08-20 (×6): 6 via TOPICAL

## 2011-08-14 MED ORDER — NITROGLYCERIN IN D5W 200-5 MCG/ML-% IV SOLN
5.0000 ug/min | INTRAVENOUS | Status: DC
  Administered 2011-08-14: 5 ug/min via INTRAVENOUS
  Filled 2011-08-14: qty 250

## 2011-08-14 MED ORDER — FLUTICASONE PROPIONATE 50 MCG/ACT NA SUSP
1.0000 | NASAL | Status: DC | PRN
  Filled 2011-08-14: qty 16

## 2011-08-14 MED ORDER — FENTANYL CITRATE 0.05 MG/ML IJ SOLN
INTRAMUSCULAR | Status: AC
  Filled 2011-08-14: qty 2

## 2011-08-14 MED ORDER — FUROSEMIDE 40 MG PO TABS
40.0000 mg | ORAL_TABLET | Freq: Two times a day (BID) | ORAL | Status: DC
  Administered 2011-08-14 – 2011-08-19 (×11): 40 mg via ORAL
  Filled 2011-08-14 (×13): qty 1

## 2011-08-14 MED ORDER — POTASSIUM CHLORIDE CRYS ER 20 MEQ PO TBCR
20.0000 meq | EXTENDED_RELEASE_TABLET | Freq: Every day | ORAL | Status: DC
  Administered 2011-08-15 – 2011-08-19 (×5): 20 meq via ORAL
  Filled 2011-08-14 (×6): qty 1

## 2011-08-14 MED ORDER — ONDANSETRON HCL 4 MG/2ML IJ SOLN
4.0000 mg | Freq: Once | INTRAMUSCULAR | Status: AC
  Administered 2011-08-14: 4 mg via INTRAVENOUS
  Filled 2011-08-14: qty 2

## 2011-08-14 MED ORDER — TRIAZOLAM 0.25 MG PO TABS: 0.2500 mg | ORAL_TABLET | Freq: Every evening | ORAL | Status: DC | PRN

## 2011-08-14 MED ORDER — CLOPIDOGREL BISULFATE 75 MG PO TABS
75.0000 mg | ORAL_TABLET | Freq: Every day | ORAL | Status: DC
  Administered 2011-08-15 – 2011-08-20 (×6): 75 mg via ORAL
  Filled 2011-08-14 (×7): qty 1

## 2011-08-14 MED ORDER — INSULIN ASPART 100 UNIT/ML ~~LOC~~ SOLN
0.0000 [IU] | Freq: Every day | SUBCUTANEOUS | Status: DC
  Administered 2011-08-14: 2 [IU] via SUBCUTANEOUS

## 2011-08-14 MED ORDER — SODIUM CHLORIDE 0.9 % IV SOLN
Freq: Once | INTRAVENOUS | Status: AC
  Administered 2011-08-14: 100 mL/h via INTRAVENOUS

## 2011-08-14 MED ORDER — NITROGLYCERIN 0.4 MG SL SUBL
0.4000 mg | SUBLINGUAL_TABLET | SUBLINGUAL | Status: DC | PRN
  Administered 2011-08-14: 0.4 mg via SUBLINGUAL
  Filled 2011-08-14: qty 25

## 2011-08-14 MED ORDER — ISOSORBIDE DINITRATE 30 MG PO TABS
30.0000 mg | ORAL_TABLET | Freq: Every day | ORAL | Status: DC
  Administered 2011-08-15 – 2011-08-18 (×4): 30 mg via ORAL
  Filled 2011-08-14 (×5): qty 1

## 2011-08-14 MED ORDER — INSULIN ASPART 100 UNIT/ML ~~LOC~~ SOLN
0.0000 [IU] | Freq: Three times a day (TID) | SUBCUTANEOUS | Status: DC
  Administered 2011-08-15: 5 [IU] via SUBCUTANEOUS
  Administered 2011-08-15 – 2011-08-18 (×10): 3 [IU] via SUBCUTANEOUS
  Administered 2011-08-19: 2 [IU] via SUBCUTANEOUS
  Administered 2011-08-19: 3 [IU] via SUBCUTANEOUS

## 2011-08-14 NOTE — Telephone Encounter (Signed)
Patient called stating that he has been having  pain in the root on his mouth for the last two days, also  severe SOB. Patient is unable to do any thing because he gets out of breath. He is having cold sweats and weakness. According to patient he has the same symptoms as he had before when he had his heart attacks.  Patient is scheduled for cardiac cath and Implantable Cardioverted defibrillator change on 09/06/11. Patient's son is taken pt to [facility] ER By car. Pt. Would like for Dr. [name] to know  About it. Daun Peacock and [facility] ER aware of patient arrival to the ER.

## 2011-08-14 NOTE — ED Notes (Signed)
[facility] was made aware of elevated result of cardiac enzymes.

## 2011-08-14 NOTE — ED Provider Notes (Signed)
Medical screening examination/treatment/procedure(s) were conducted as a shared visit with non-physician practitioner(s) and myself.  I personally evaluated the patient during the encounter  Pt seen and evaluated- he is feeling improved on nitroglycerin drip, but continues to have mild chest pain.  Troponin elevated.  Pt seen by cardiology and they are planning to take him to cath lab.  Chest pain has been ongoing x 3 days prior to ED arrival.   Ethelda Chick, MD 08/14/11 [phone]

## 2011-08-14 NOTE — ED Notes (Signed)
Patient claimed that his chest pain remains to be at 3/10. Pt is V paced at the monitor, HR=70, VSS stable, Nitroglycerin gtt increased to 15 mcg/min, will continue to monitor.

## 2011-08-14 NOTE — ED Provider Notes (Addendum)
History     CSN: [id number]  Arrival date & time 08/14/11  1109   First MD Initiated Contact with Patient 08/14/11 1129      Chief Complaint  Patient presents with  . Chest Pain    (Consider location/radiation/quality/duration/timing/severity/associated sxs/prior treatment) HPI Cardiologist: Dr. [name] EP MD: Dr. [name]  History from patient. 76 year old male with past medical history of coronary artery disease, ICD in situ, diabetes who presents with pain to the roof of his mouth. He states that this began on Friday evening and has been persistent in nature since. Pain has not significantly changed since it started. He has noted some associated sensations of indigestion and pain in the epigastrium and in the upper left arm. He specifically denies any pain located to his chest. Patient has had MIs in the past and states that his current symptoms are consistent with his previous anginal pain. No known aggravating/alleviating factors; it does not worsen with exertion. He does not have any nitroglycerin currently at home but states "I would have taken a whole bottle if I had it." He did take a baby aspirin this morning and has tried Tylenol for the pain, neither of which were helpful. He tried Valium on Friday which was slightly helpful.  He additionally endorses shortness of breath. This worsens with exertion. He denies shortness of breath at his typical baseline. States he does have occasional PND but denies orthopnea. He has not noticed any peripheral edema. Denies any diaphoresis.  He saw Dr. [name] on Friday and reports that he was told that the batteries in his pacemaker may be dead - he is scheduled for a pacer changeout on 7/3 (chart indicates that this is for elective replacement). He did not have these symptoms when he saw Dr. [name].  Past Medical History  Diagnosis Date  . Atrial fibrillation   . CAD, ARTERY BYPASS GRAFT   . CARDIOMYOPATHY, ISCHEMIC   . Chronic systolic heart  failure   . DIABETES MELLITUS   . Dysuria   . HYPERLIPIDEMIA-MIXED   . ICD - IN SITU   . RENAL INSUFFICIENCY     Past Surgical History  Procedure Date  . Coronary artery bypass graft   . Redo cabg   . Mitral valve repair     with an angioplasty ring  . Inguinal hernia repair   . Cervical discectomy     C3-C4  . Cardiac defibrillator placement     Guidant    Family History  Problem Relation Age of Onset  . Hypertension Other     History  Substance Use Topics  . Smoking status: Former Smoker -- 1.5 packs/day for 40 years    Types: Cigarettes, Pipe    Quit date: 03/06/1985  . Smokeless tobacco: Not on file  . Alcohol Use: No      Review of Systems  Constitutional: Negative for fever, chills, activity change and appetite change.  Respiratory: Positive for shortness of breath. Negative for cough and chest tightness.   Cardiovascular: Negative for chest pain, palpitations and leg swelling.  Gastrointestinal: Positive for nausea and abdominal pain. Negative for vomiting and diarrhea.  Musculoskeletal: Negative for myalgias.  Skin: Negative for color change and rash.  Neurological: Negative for dizziness and weakness.  All other systems reviewed and are negative.    Allergies  Codeine and Lorazepam  Home Medications   Current Outpatient Rx  Name Route Sig Dispense Refill  . ASPIRIN 81 MG PO TABS Oral Take 81 mg by mouth  daily.      Marland Kitchen BENAZEPRIL HCL 20 MG PO TABS Oral Take 1 tablet (20 mg total) by mouth daily. 90 tablet 3  . DOFETILIDE 500 MCG PO CAPS Oral Take 1 capsule (500 mcg total) by mouth 2 (two) times daily. 180 capsule 3  . FLUTICASONE PROPIONATE 50 MCG/ACT NA SUSP Nasal Place 1 spray into the nose as needed.     . FUROSEMIDE 40 MG PO TABS Oral Take 1 tablet (40 mg total) by mouth 2 (two) times daily. 180 tablet 3  . GLIMEPIRIDE 4 MG PO TABS Oral Take 4 mg by mouth 2 (two) times daily before a meal.     . ISOSORBIDE DINITRATE 30 MG PO TABS Oral Take 30  mg by mouth daily. Taking 1/2 tablet in the am and a 1/2 in the pm    . METFORMIN HCL 500 MG PO TABS Oral Take 500 mg by mouth daily.     Marland Kitchen METOPROLOL TARTRATE 50 MG PO TABS  1/2 tab po bid    . POTASSIUM CHLORIDE CRYS ER 20 MEQ PO TBCR Oral Take 20 mEq by mouth daily. Taking 1 1/2 tablets daily    . SAXAGLIPTIN HCL 2.5 MG PO TABS Oral Take 5 mg by mouth daily.    Marland Kitchen TRIAZOLAM 0.25 MG PO TABS Oral Take 0.25 mg by mouth at bedtime as needed.        BP 128/80  Pulse 71  Temp(Src) 98.4 F (36.9 C) (Oral)  Resp 17  SpO2 96%  Physical Exam  Nursing note and vitals reviewed. Constitutional: He is oriented to person, place, and time. He appears well-developed and well-nourished. No distress.  HENT:  Head: Normocephalic and atraumatic.  Right Ear: External ear normal.  Left Ear: External ear normal.  Mouth/Throat: Oropharynx is clear and moist. No oropharyngeal exudate.  Eyes: EOM are normal. Pupils are equal, round, and reactive to light.  Neck: Normal range of motion. Neck supple. No JVD present.  Cardiovascular: Normal rate, regular rhythm and normal heart sounds.  Exam reveals no gallop and no friction rub.   No murmur heard. Pulmonary/Chest: Effort normal and breath sounds normal. He exhibits no tenderness.  Abdominal: Soft. Bowel sounds are normal. He exhibits no distension and no mass. There is tenderness. There is no rebound and no guarding.       Mildly tender to epigastrium  Musculoskeletal: Normal range of motion.  Neurological: He is alert and oriented to person, place, and time. No cranial nerve deficit. Coordination normal.  Skin: Skin is warm and dry. No rash noted. He is not diaphoretic.  Psychiatric: He has a normal mood and affect.    ED Course  Procedures (including critical care time)  Labs Reviewed  PRO B NATRIURETIC PEPTIDE - Abnormal; Notable for the following:    Pro B Natriuretic peptide (BNP) 2902.0 (*)    All other components within normal limits  CBC -  Abnormal; Notable for the following:    RDW 15.7 (*)    Platelets 123 (*)    All other components within normal limits  COMPREHENSIVE METABOLIC PANEL - Abnormal; Notable for the following:    Sodium 132 (*)    Glucose, Bld 428 (*)    BUN 27 (*)    AST 49 (*)    GFR calc non Af Amer 56 (*)    GFR calc Af Amer 65 (*)    All other components within normal limits  POCT I-STAT TROPONIN I - Abnormal; Notable  for the following:    Troponin i, poc 1.84 (*)    All other components within normal limits  LIPASE, BLOOD  CARDIAC PANEL(CRET KIN+CKTOT+MB+TROPI)   Dg Chest 2 View  08/14/2011  *RADIOLOGY REPORT*  Clinical Data: Shortness of breath, pacemaker battery dead.  CHEST - 2 VIEW  Comparison: 07/22/2008  Findings: Lungs are essentially clear. No pleural effusion or pneumothorax.  Stable cardiomegaly. Postsurgical changes related to prior CABG.  Left subclavian ICD.  Degenerative changes of the visualized thoracolumbar spine. Cervical spine fixation hardware, incompletely visualized.  IMPRESSION: No evidence of acute cardiopulmonary disease.  Stable cardiomegaly.  Original Report Authenticated By: Charline Bills, M.D.     1. NSTEMI (non-ST elevated myocardial infarction)       MDM  12:09 PM Have discussed with [facility] cardiology as they were in the dept seeing another patient. They will consult.  2:07 PM Called Trish with [place]; she states pt is on consult list. She was notified of elevated troponin.  2:10 PM Consultant at bedside. Pt states pain improved with nitro gtt, morphine.  2:53 PM Pt to be admitted; will go to cath lab for procedure/possible PCI.      Grant Fontana, PA-C 08/14/11 1454  Grant Fontana, PA-C 08/14/11 1456  Addendum (ECG interpretation):  Date: 08/14/2011  Rate: 70  Rhythm: ventricular paced rhythm  QRS Axis: normal  Intervals: n/a  ST/T Wave abnormalities: n/a  Conduction Disutrbances:n/a  Narrative Interpretation:   Old EKG  Reviewed: as compared with Dec 30 2007 rate increased    Grant Fontana, [place] 08/14/11 1534

## 2011-08-14 NOTE — ED Notes (Signed)
Pain in the roof of his mouth since  Friday this is how he knows he is having a heart attack has called dr Brien Few office and  He will meet them here  . Pt is sob also

## 2011-08-14 NOTE — ED Notes (Signed)
Karma Ganja, MD and Mayford Knife, [place] notified of abnormal lab test results

## 2011-08-14 NOTE — ED Provider Notes (Signed)
Medical screening examination/treatment/procedure(s) were conducted as a shared visit with non-physician practitioner(s) and myself.  I personally evaluated the patient during the encounter  Ethelda Chick, MD 08/14/11 1542

## 2011-08-14 NOTE — H&P (Signed)
History and Physical   Patient ID: Corey [name] MRN: [id number], DOB/AGE: 1935-06-17   Admit date: 08/14/2011 Date of Consult: 08/14/2011   Primary Physician: Park Pope, MD, MD Primary Cardiologist: Lewayne Bunting, MD (EP); Bonnee Quin, MD   Pt. Profile: Corey [name] is a 76yo male with PMHx significant for CAD (s/p multiple cardiac catheterizations, multiple stent placements, CABG x 4 with re-do and MV repair on 06/2004), ischemic cardiomyopathy (EF 35-40%), history of atrial fibrillation and VT (s/p BiV ICD implantation 07/2005), history of MR (s/p MV repair 06/2004), type 2 DM, HTN, HL and COPD who presents to [facility] with mouth/throat pain, weakness and cold sweats consistent with prior MI.   Problem List: Past Medical History  Diagnosis Date  . Atrial fibrillation   . CAD, ARTERY BYPASS GRAFT   . CARDIOMYOPATHY, ISCHEMIC   . Chronic systolic heart failure   . DIABETES MELLITUS   . Dysuria   . HYPERLIPIDEMIA-MIXED   . ICD - IN SITU   . RENAL INSUFFICIENCY     Past Surgical History  Procedure Date  . Coronary artery bypass graft   . Redo cabg   . Mitral valve repair     with an angioplasty ring  . Inguinal hernia repair   . Cervical discectomy     C3-C4  . Cardiac defibrillator placement     Guidant     Allergies:  Allergies  Allergen Reactions  . Codeine     "made skin crawl"  . Lorazepam Nausea Only    "became unresponsive"    HPI:   The patient was recently seen in the office on 08/11/11 by Corey [name]. It was noted that his device has reached ERI and generator change has been for 09/06/2011. Heart failure symptoms described as class IIb at that time and was advised to continued current medications and follow low-sodium diet. He denied anginal symptoms at that time.   Prior telephone notes reviewed and noted. His called our answering service on 08/12/11 and spoke to one of our PAs complaining of similar symptoms. There was resistance and  frustration expressed when advised to present to an ED. The patient ultimately refused. Today, the patient called the office stating he continued to have these symptoms and he was taking the patient to the ED.  He notes intermittent pain in the roof of his mouth, faw and throat began on 08/11/11 at 10PM with radiation to his left-arm with associated shortness of breath, weakness, diaphoresis, nausea, palpitations. He notes of PND. No orthopnea, no LE swelling. He notes cough while laying flat. He notes some improvement with Valium. Has not taken NTG (does not have any). No active bleeding. He reports some occasional wheezing. Occasional constipation. No fevers, chills or sick contacts. He notes contacting the answering service this weekend, and elected to "hold out" until Monday with the thought that presenting over the weekend would "take too long."  On arrival, EKG reveals A-V paced rhythm at 70 bpm. POC troponin-I elevated at 1.84. pBNP elevated at 2902.0. CBC reveals thrombocytopenia at 123. Glucose 428. BMET reveals mild hyponatremia. Preserved renal function (Cr 1.24). CXR without acute cardiopulmonary abnormalities. He received a full-dose ASA. NTG gtt, zofran and morphine.   Home Medications: Prior to Admission medications   Medication Sig Start Date End Date Taking? Authorizing Provider  aspirin 81 MG tablet Take 81 mg by mouth daily.    Yes Historical Provider, MD  benazepril (LOTENSIN) 20 MG tablet Take 1 tablet (20 mg  total) by mouth daily. 05/01/11  Yes Herby Abraham, MD  dofetilide (TIKOSYN) 500 MCG capsule Take 1 capsule (500 mcg total) by mouth 2 (two) times daily. 09/06/10  Yes Herby Abraham, MD  fluticasone (FLONASE) 50 MCG/ACT nasal spray Place 1 spray into the nose as needed.  03/21/11  Yes Historical Provider, MD  furosemide (LASIX) 40 MG tablet Take 1 tablet (40 mg total) by mouth 2 (two) times daily. 05/01/11  Yes Herby Abraham, MD  glimepiride (AMARYL) 4 MG tablet Take 8 mg  by mouth daily before breakfast.   Yes Historical Provider, MD  isosorbide dinitrate (ISORDIL) 30 MG tablet Take 30 mg by mouth daily. Taking 1/2 tablet in the am and a 1/2 in the pm   Yes Historical Provider, MD  metFORMIN (GLUCOPHAGE) 500 MG tablet Take 500 mg by mouth every evening.    Yes Historical Provider, MD  metoprolol (LOPRESSOR) 50 MG tablet Take 25 mg by mouth 2 (two) times daily. 1/2 tab po bid   Yes Historical Provider, MD  potassium chloride SA (K-DUR,KLOR-CON) 20 MEQ tablet Take 20 mEq by mouth daily. Taking 1 1/2 tablets daily   Yes Historical Provider, MD  saxagliptin HCl (ONGLYZA) 2.5 MG TABS tablet Take 5 mg by mouth every evening.    Yes Historical Provider, MD  triazolam (HALCION) 0.25 MG tablet Take 0.25 mg by mouth at bedtime as needed. For sleep   Yes Historical Provider, MD    Inpatient Medications:     . sodium chloride   Intravenous Once  . aspirin  243 mg Oral Once  .  morphine injection  4 mg Intravenous Once  . ondansetron (ZOFRAN) IV  4 mg Intravenous Once    (Not in a hospital admission)  Family History  Problem Relation Age of Onset  . Hypertension Other      History   Social History  . Marital Status: Widowed    Spouse Name: N/A    Number of Children: N/A  . Years of Education: N/A   Occupational History  . Not on file.   Social History Main Topics  . Smoking status: Former Smoker -- 1.5 packs/day for 40 years    Types: Cigarettes, Pipe    Quit date: 03/06/1985  . Smokeless tobacco: Not on file  . Alcohol Use: No  . Drug Use: No  . Sexually Active: Not on file   Other Topics Concern  . Not on file   Social History Narrative  . No narrative on file     Review of Systems: General: positive for jaw pain, left arm pain, diaphoresis, negative for chills, fever, or weight changes.  Cardiovascular: positive for shortness of breath, palpitations, PND, lightheadedness, negative for chest pain, edema, orthopnea, palpitations, paroxysmal  nocturnal dyspnea  Dermatological: negative for rash Respiratory: positive for cough and wheezing Urologic: negative for hematuria Abdominal: negative for nausea, vomiting, diarrhea, bright red blood per rectum, melena, or hematemesis Neurologic:  negative for visual changes, syncope, or dizziness All other systems reviewed and are otherwise negative except as noted above.  Physical Exam: Blood pressure 101/74, pulse 70, temperature 98.4 F (36.9 C), temperature source Oral, resp. rate 16, SpO2 94.00%.    General: Well developed, well nourished, in no acute distress. Head: Normocephalic, atraumatic, sclera non-icteric, no xanthomas, nares are without discharge. Neck: Negative for carotid bruits. JVD appreciated to mandibular angle.  Lungs: Clear bilaterally to auscultation without wheezes, rales, or rhonchi. Breathing is unlabored. Heart: RRR with S1 S2.  No murmurs, rubs, or gallops appreciated. Abdomen: Soft, non-tender, non-distended with normoactive bowel sounds. No hepatomegaly. No rebound/guarding. No obvious abdominal masses. Msk:  Strength and tone appears normal for age. Extremities: No clubbing, cyanosis or edema.  Distal pedal pulses are 2+ and equal bilaterally. Neuro: Alert and oriented X 3. Moves all extremities spontaneously. Psych:  Responds to questions appropriately with a normal affect.  Labs: Recent Labs  [facility] 08/14/11 1215   WBC 6.7   HGB 13.8   HCT 41.0   MCV 93.8   PLT 123*   Lab 08/14/11 1215  NA 132*  K 4.5  CL 96  CO2 23  BUN 27*  CREATININE 1.24  CALCIUM 9.3  PROT 7.0  BILITOT 0.6  ALKPHOS 44  ALT 24  AST 49*  AMYLASE --  LIPASE 38  GLUCOSE 428*   Recent Labs  Basename 08/14/11 1316   CKTOTAL 252*   CKMB 13.7*   CKMBINDEX --   TROPONINI 4.38*   Radiology/Studies: Dg Chest 2 View  08/14/2011  *RADIOLOGY REPORT*  Clinical Data: Shortness of breath, pacemaker battery dead.  CHEST - 2 VIEW  Comparison: 07/22/2008  Findings: Lungs are  essentially clear. No pleural effusion or pneumothorax.  Stable cardiomegaly. Postsurgical changes related to prior CABG.  Left subclavian ICD.  Degenerative changes of the visualized thoracolumbar spine. Cervical spine fixation hardware, incompletely visualized.  IMPRESSION: No evidence of acute cardiopulmonary disease.  Stable cardiomegaly.  Original Report Authenticated By: Charline Bills, M.D.   EKG: AV paced, 70 bpm  ASSESSMENT AND PLAN:   1. CAD/NSTEMI- patient with jaw pain radiating to left arm, diaphoresis, weakness remniscent of prior MI as noted in HPI. Troponin-I initially 1.84 at 1236, up trended to 4.38 at 1316. EKG AV paced. Heparin bolus started. Glucostabilizer initiated in the ED afterwards. Cath lab called and added on for next case. He is currently being transported for the procedure.   - Admit to stepdown  - Cycle cardiac biomarkers  - Pre-cath orders in  - Heparin started as above  - Continue ASA, ACEi, BB, nitrate   - Will add statin  - Further recommendations per interventionalist's findings  - Heart healthy diet  - Daily BMET/CBC/serial EKGs    2. Ischemic cardiomyopathy- reports occasional PND and recumbent cough. JVD noted on exam. No abdominal distention or peripheral edema. Lungs CTAB. CXR without acute cardiopulmonary process. pBNP markedly elevated.    - Continue outpatient Lasix dosing with potassium supplementation  - ACEi/BB/nitrate as above  - CRT[facility]D at [facility] since 08/02/11, generator change scheduled 09/06/11. Normal device function per interrogation on 08/11/11 office visit.   - Strict I/Os, daily weights, heart healthy diet  - Will monitor volume/respiratory status post-cath   3. History of atrial fibrillation/flutter- s/p ablation. Maintained on Tikosyn.   - Will restart Tikosyn post-cath per home schedule  4. Type 2 DM- hold oral hypoglycemics  - Glucostabilizer now given CBG > 400  - Initiate SSI while inpatient  - Monitor CBGs with insulin  adjustments as needed  - Will get Hgb A1C  5. Hypertension  - Continue above antihypertensives  6. Hyperlipidemia  - Will get lipid panel  - Add statin  7. COPD- stable  - Continue to monitor for wheezing or dyspnea   Will place order for Lovenox for DVT prophylaxis post-cath. ACS full-dose heparin will need to be ordered should this be required.   Signed, R. Hurman Horn, PA-C 08/14/2011, 2:24 PM  Patient seen and examined. I agree with  the assessment and plan as detailed above. See also my additional thoughts below.   The patient is seen and examined in the emergency room. He has his usual symptom of ischemia. Troponin is rising. He will be sent to the cath lab immediately. Dr [name] notes that the patient has not been willing to take a statin in the past. This will have to be discussed with him at the time of discharge. Willa Rough, MD, [facility] 08/14/2011 4:14 PM

## 2011-08-14 NOTE — CV Procedure (Signed)
Cardiac Catheterization Procedure Note  Name: Corey [name] MRN: [id number] DOB: 1936/03/06  Procedure: Left Heart Cath, Selective Coronary Angiography, SVG angio, LIMA angio, LV angiography  Indication: NSTEMI. 76 year old gentleman with extensive CAD and 2 previous bypass surgeries. He has also undergone mitral valve repair and cardiac resynchronization therapy. He presents with acute onset of chest and throat tightness approximately 72 hours ago. His initial cardiac markers were elevated on point-of-care testing and his troponin was further elevated on subsequent testing. He was brought urgently for cardiac catheterization.  Procedural details: The right groin was prepped, draped, and anesthetized with 1% lidocaine. Using modified Seldinger technique, a 5 French sheath was introduced into the right femoral artery. Standard Judkins catheters were used for coronary angiography, saphenous vein graft angiography, LIMA angiography, and left ventriculography. Catheter exchanges were performed over a guidewire. There were no immediate procedural complications. The patient was transferred to the post catheterization recovery area for further monitoring.  Procedural Findings: Hemodynamics:  AO 101/63 with a mean of 78 LV 101/25   Coronary angiography: Coronary dominance: right  Left mainstem: The left main is patent with moderate calcification. The vessel is diffusely diseased with 50% stenosis present. It divides into the LAD and left circumflex.  Left anterior descending (LAD): The LAD is severely diseased at the first septal perforator. The vessel goes on to occlude in the midportion.  Left circumflex (LCx): The circumflex is fairly small in caliber. The vessel has severe diffuse disease with total occlusion in the midportion of the AV groove. There is an intermediate branch with moderately severe diffuse stenosis.  Right coronary artery (RCA): The RCA is totally occluded. There are ridging  collaterals supplying the distal RCA with a well formed collateral to the posterolateral branch retrograde filling the mid and distal RCA and the RV marginal branch. There are also left to right collaterals supplying the distal RCA circulation.  LIMA to LAD: The vessel remains patent. The LAD Is small and diffusely diseased beyond the LIMA insertion site  Saphenous vein graft to distal RCA: The vein graft is heavily stented. It is totally occluded in the proximal aspect.  Saphenous vein graft to diagonal: Chronically occluded and unchanged from previous study.  Saphenous vein graft sequence to OM1 and OM 2: The vein graft is patent throughout. The distal anastomotic sites are intact. There is a focal in lesion in the distal body of the graft prior to the insertion of the first OM. This lesion appears 50% stenotic in some views and up to 75% in other views. The distal OM branches supply left to right collaterals to the distal RCA territory.  Left ventriculography: There is very severe left ventricular segmental systolic dysfunction. The entire apex is akinetic. The basal anterior wall and basal inferior wall are hypokinetic. The estimated left ventricular ejection fraction is 20%.  Final Conclusions:   1. Severe native multivessel coronary artery disease 2. Status post CABG with continued patency of the LIMA to LAD and continued patency of the saphenous vein graft sequence to OM1 and OM 2. 3. Total occlusion of the saphenous vein graft to right coronary artery 4. Chronic occlusion of the saphenous vein graft to diagonal 5. Severe segmental LV systolic dysfunction  Recommendations: Recommend medical therapy and serial enzymes to help quantify the extent of this patient's acute myocardial infarction. He clearly has developed severe left ventricular dysfunction. Consideration will be given to staged intervention of the saphenous vein graft to obtuse marginal branches. The lesion appears borderline and  I will review it with Dr. [name]. Will start the patient on Plavix for further medical treatment of his non-ST elevation infarction.  [name] 08/14/2011, 4:16 PM

## 2011-08-14 NOTE — ED Notes (Signed)
Pt presented to the Er with c/o pain to the roof of his mouth with shortness of breath, dizziness onset Friday. Pt claimed that the pain he is having is the same pain that he had when he had a heart attack. Pt was changed to a hospital gown and attached to the cardiac monitor. O2 started at 2 LPM/Roff. Georgie Chard PAC is at the bedside.

## 2011-08-15 ENCOUNTER — Encounter (HOSPITAL_COMMUNITY): Payer: Self-pay | Admitting: *Deleted

## 2011-08-15 DIAGNOSIS — I214 Non-ST elevation (NSTEMI) myocardial infarction: Secondary | ICD-10-CM

## 2011-08-15 LAB — CARDIAC PANEL(CRET KIN+CKTOT+MB+TROPI)
CK, MB: 7.5 ng/mL (ref 0.3–4.0)
CK, MB: 9.3 ng/mL (ref 0.3–4.0)
Relative Index: 5.2 — ABNORMAL HIGH (ref 0.0–2.5)
Total CK: 149 U/L (ref 7–232)
Troponin I: 3 ng/mL (ref <0.30)
Troponin I: 3.81 ng/mL (ref <0.30)

## 2011-08-15 LAB — BASIC METABOLIC PANEL
BUN: 24 mg/dL — ABNORMAL HIGH (ref 6–23)
CO2: 27 mEq/L (ref 19–32)
GFR calc non Af Amer: 54 mL/min — ABNORMAL LOW (ref >90)
Glucose, Bld: 189 mg/dL — ABNORMAL HIGH (ref 70–99)
Potassium: 4.3 mEq/L (ref 3.5–5.1)
Sodium: 138 mEq/L (ref 135–145)

## 2011-08-15 LAB — CBC
HCT: 40.4 % (ref 39.0–52.0)
Hemoglobin: 13.6 g/dL (ref 13.0–17.0)
MCH: 31.7 pg (ref 26.0–34.0)
MCHC: 33.7 g/dL (ref 30.0–36.0)
RBC: 4.29 MIL/uL (ref 4.22–5.81)

## 2011-08-15 LAB — GLUCOSE, CAPILLARY
Glucose-Capillary: 194 mg/dL — ABNORMAL HIGH (ref 70–99)
Glucose-Capillary: 197 mg/dL — ABNORMAL HIGH (ref 70–99)
Glucose-Capillary: 206 mg/dL — ABNORMAL HIGH (ref 70–99)

## 2011-08-15 LAB — LIPID PANEL
HDL: 26 mg/dL — ABNORMAL LOW (ref >39)
Total CHOL/HDL Ratio: 5.4 RATIO
Triglycerides: 148 mg/dL (ref <150)

## 2011-08-15 NOTE — Care Management Note (Addendum)
    Page 1 of 1   08/15/2011     3:00:04 PM   CARE MANAGEMENT NOTE 08/15/2011  Patient:  Corey [name], Corey [name]   Account Number:  [id number]  Date Initiated:  08/15/2011  Documentation initiated by:  Junius Creamer  Subjective/Objective Assessment:   adm w mi     Action/Plan:   lives alone, pcp dr Venora Maples   Anticipated DC Date:     Anticipated DC Plan:        DC Planning Services  CM consult      Choice offered to / List presented to:             Status of service:   Medicare Important Message given?   (If response is "NO", the following Medicare IM given date fields will be blank) Date Medicare IM given:   Date Additional Medicare IM given:    Discharge Disposition:    Per UR Regulation:  Reviewed for med. necessity/level of care/duration of stay  If discussed at Long Length of Stay Meetings, dates discussed:    Comments:  6/11 10:50a debbie Silena Wyss rn,bsn [phone] pt does have ins medicare part d for meds. pt has $200.00 per month copay for tikosyn. have placed tikosyn pt assit forms on chart for md to sign.

## 2011-08-15 NOTE — Progress Notes (Signed)
Subjective:  Case discussed with patient.  No current pain.  Up in bed.    Objective:  Vital Signs in the last 24 hours: Temp:  [97.6 F (36.4 C)-99.3 F (37.4 C)] 97.6 F (36.4 C) (06/11 1220) Pulse Rate:  [69-79] 70  (06/11 1220) Resp:  [12-22] 17  (06/11 1220) BP: (96-130)/(53-81) 96/57 mmHg (06/11 1220) SpO2:  [92 %-98 %] 93 % (06/11 1220) FiO2 (%):  [2 %] 2 % (06/10 1900) Weight:  [198 lb 13.7 oz (90.2 kg)] 198 lb 13.7 oz (90.2 kg) (06/11 0436)  Intake/Output from previous day: 06/10 0701 - 06/11 0700 In: 731.3 [P.O.:320; I.V.:411.3] Out: 1850 [Urine:1850]   Physical Exam: General: Well developed, well nourished, in no acute distress. Head:  Normocephalic and atraumatic. Lungs: Clear to auscultation and percussion. Heart: Normal S1 and S2.  Apical murmur.   Pulses: Pulses normal in all 4 extremities. Extremities: No clubbing or cyanosis. No edema. Neurologic: Alert and oriented x 3.    Lab Results:  [facility] 08/15/11 0645 08/14/11 1909  WBC 7.3 6.3  HGB 13.6 13.0  PLT 132* 126*    Basename 08/15/11 0645 08/14/11 1909 08/14/11 1215  NA 138 -- 132*  K 4.3 -- 4.5  CL 99 -- 96  CO2 27 -- 23  GLUCOSE 189* -- 428*  BUN 24* -- 27*  CREATININE 1.27 1.31 --    Basename 08/15/11 0700 08/14/11 2352  TROPONINI 3.00* 3.81*   Hepatic Function Panel  Basename 08/14/11 1215  PROT 7.0  ALBUMIN 3.6  AST 49*  ALT 24  ALKPHOS 44  BILITOT 0.6  BILIDIR --  IBILI --    Basename 08/15/11 0645  CHOL 141   No results found for this basename: PROTIME in the last 72 hours  Imaging: Dg Chest 2 View  08/14/2011  *RADIOLOGY REPORT*  Clinical Data: Shortness of breath, pacemaker battery dead.  CHEST - 2 VIEW  Comparison: 07/22/2008  Findings: Lungs are essentially clear. No pleural effusion or pneumothorax.  Stable cardiomegaly. Postsurgical changes related to prior CABG.  Left subclavian ICD.  Degenerative changes of the visualized thoracolumbar spine. Cervical spine  fixation hardware, incompletely visualized.  IMPRESSION: No evidence of acute cardiopulmonary disease.  Stable cardiomegaly.  Original Report Authenticated By: Charline Bills, M.D.      Assessment/Plan:  Patient Active Hospital Problem List: NSTEMI (non-ST elevated myocardial infarction) (08/14/2011)   Assessment: films reviewed in detail   Plan: recheck 2 D echo to assess LV.  May need PCI of existing graft which is worrisome.    CARDIOMYOPATHY, ISCHEMIC (07/11/2008)   Assessment: sig reduced LV   Plan: recheck echo Atrial fibrillation (07/11/2008)   Assessment: maintaining NSR.     Plan: continue dofetilide      Shawnie Pons, MD, [facility], [facility] 08/15/2011, 2:26 PM

## 2011-08-15 NOTE — Progress Notes (Signed)
CARDIAC REHAB PHASE I   PRE:  Rate/Rhythm: 70 paced    BP: sitting 97/59    SaO2: 97 RA RA  MODE:  Ambulation: 350 ft   POST:  Rate/Rhythm: 74 paced    BP: sitting 104/58     SaO2: 89 RA, then up to 95 RA with rest  Fairly steady assist x1 (no device). Pt had sudden episode of SOB, stopped and rested. Resolved and able to walk rest of lap without stopping. SaO2 appeared borderline on return at 89 RA. Gradually up to 95 RA after sitting. Gave MI book and diet. Friend present.  [phone]  Harriet Masson CES, ACSM

## 2011-08-15 NOTE — Progress Notes (Signed)
Cardiology Progress Note Patient Name: Corey [name] Date of Encounter: 08/15/2011, 11:17 AM     Subjective  No overnight events. Patient denies jaw/arm pain or sob.   Objective   Telemetry: AV Paced 70s  Medications: . aspirin  243 mg Oral Once  . aspirin EC  81 mg Oral Daily  . atorvastatin  40 mg Oral q1800  . benazepril  20 mg Oral Daily  . Chlorhexidine Gluconate Cloth  6 each Topical Q0600  . clopidogrel  300 mg Oral Once  . clopidogrel  75 mg Oral Q breakfast  . dofetilide  500 mcg Oral Q12H  . enoxaparin  40 mg Subcutaneous Q24H  . furosemide  40 mg Oral BID  . insulin aspart  0-15 Units Subcutaneous TID WC  . insulin aspart  0-5 Units Subcutaneous QHS  . insulin aspart  3 Units Subcutaneous TID WC  . insulin regular  0-10 Units Intravenous TID WC  . isosorbide dinitrate  30 mg Oral Daily  . metoprolol  25 mg Oral BID  .  morphine injection  4 mg Intravenous Once  . mupirocin ointment   Nasal BID  . ondansetron (ZOFRAN) IV  4 mg Intravenous Once  . potassium chloride SA  20 mEq Oral Daily  . sodium chloride  3 mL Intravenous Q12H   . sodium chloride 75 mL/hr at 08/14/11 1931  . DISCONTD: insulin (NOVOLIN-R) infusion    . DISCONTD: nitroGLYCERIN Stopped (08/14/11 1500)    Physical Exam: Temp:  [97.8 F (36.6 C)-99.3 F (37.4 C)] 98.1 F (36.7 C) (06/11 0741) Pulse Rate:  [69-79] 75  (06/11 0900) Resp:  [12-22] 14  (06/11 0900) BP: (100-132)/(53-81) 130/81 mmHg (06/11 0900) SpO2:  [92 %-100 %] 97 % (06/11 0900) FiO2 (%):  [2 %] 2 % (06/10 1900) Weight:  [198 lb 13.7 oz (90.2 kg)] 198 lb 13.7 oz (90.2 kg) (06/11 0436)  General: Elderly white male, in no acute distress. Head: Normocephalic, atraumatic, sclera non-icteric, nares are without discharge.  Neck: Supple. Negative for carotid bruits. JVD to mid neck. Lungs: Clear bilaterally to auscultation without wheezes, rales, or rhonchi. Breathing is unlabored. Heart: RRR S1 S2 without murmurs, rubs, or  gallops.  Abdomen: Soft, non-tender, non-distended with normoactive bowel sounds. No rebound/guarding. No obvious abdominal masses. Msk:  Strength and tone appear normal for age. Extremities: Right groin without hematoma or bruit.Trace BLE edema. No clubbing or cyanosis. Distal pedal pulses are intact and equal bilaterally. Neuro: Alert and oriented X 3. Moves all extremities spontaneously. Psych:  Responds to questions appropriately with a flat affect.   Intake/Output Summary (Last 24 hours) at 08/15/11 1117 Last data filed at 08/15/11 0800  Gross per 24 hour  Intake 851.25 ml  Output   1850 ml  Net -998.75 ml    Labs:  Basename 08/15/11 0645 08/14/11 1909 08/14/11 1215  NA 138 -- 132*  K 4.3 -- 4.5  CL 99 -- 96  CO2 27 -- 23  GLUCOSE 189* -- 428*  BUN 24* -- 27*  CREATININE 1.27 1.31 --  CALCIUM 9.2 -- 9.3   Basename 08/14/11 1215  AST 49*  ALT 24  ALKPHOS 44  BILITOT 0.6  PROT 7.0  ALBUMIN 3.6   Basename 08/14/11 1215  LIPASE 38   Basename 08/15/11 0645 08/14/11 1909  WBC 7.3 6.3  HGB 13.6 13.0  HCT 40.4 39.1  MCV 94.2 94.0  PLT 132* 126*   Basename 08/15/11 0700 08/14/11 2352  08/14/11 1909 08/14/11 1316  CKTOTAL 149 178 212 252*  CKMB 7.5* 9.3* 12.3* 13.7*  TROPONINI 3.00* 3.81* 3.51* 4.38*   Basename 08/14/11 1909  HGBA1C 8.1*   Basename 08/15/11 0645  CHOL 141  HDL 26*  LDLCALC 85  TRIG 161  CHOLHDL 5.4   Radiology/Studies:    08/14/11 - Cardiac Cath Hemodynamics:  AO 101/63 with a mean of 78  LV 101/25  Coronary angiography:  Coronary dominance: right  Left mainstem: The left main is patent with moderate calcification. The vessel is diffusely diseased with 50% stenosis present. It divides into the LAD and left circumflex.  Left anterior descending (LAD): The LAD is severely diseased at the first septal perforator. The vessel goes on to occlude in the midportion.  Left circumflex (LCx): The circumflex is fairly small in caliber. The vessel  has severe diffuse disease with total occlusion in the midportion of the AV groove. There is an intermediate branch with moderately severe diffuse stenosis.  Right coronary artery (RCA): The RCA is totally occluded. There are ridging collaterals supplying the distal RCA with a well formed collateral to the posterolateral branch retrograde filling the mid and distal RCA and the RV marginal branch. There are also left to right collaterals supplying the distal RCA circulation.  LIMA to LAD: The vessel remains patent. The LAD Is small and diffusely diseased beyond the LIMA insertion site  Saphenous vein graft to distal RCA: The vein graft is heavily stented. It is totally occluded in the proximal aspect.  Saphenous vein graft to diagonal: Chronically occluded and unchanged from previous study.  Saphenous vein graft sequence to OM1 and OM 2: The vein graft is patent throughout. The distal anastomotic sites are intact. There is a focal in lesion in the distal body of the graft prior to the insertion of the first OM. This lesion appears 50% stenotic in some views and up to 75% in other views. The distal OM branches supply left to right collaterals to the distal RCA territory.  Left ventriculography: There is very severe left ventricular segmental systolic dysfunction. The entire apex is akinetic. The basal anterior wall and basal inferior wall are hypokinetic. The estimated left ventricular ejection fraction is 20%.  Final Conclusions:  1. Severe native multivessel coronary artery disease  2. Status post CABG with continued patency of the LIMA to LAD and continued patency of the saphenous vein graft sequence to OM1 and OM 2.  3. Total occlusion of the saphenous vein graft to right coronary artery  4. Chronic occlusion of the saphenous vein graft to diagonal  5. Severe segmental LV systolic dysfunction  Recommendations: Recommend medical therapy and serial enzymes to help quantify the extent of this patient's  acute myocardial infarction. He clearly has developed severe left ventricular dysfunction. Consideration will be given to staged intervention of the saphenous vein graft to obtuse marginal branches. The lesion appears borderline and I will review it with Dr. [name]. Will start the patient on Plavix for further medical treatment of his non-ST elevation infarction.  08/14/2011 - Chest 2 View Findings: Lungs are essentially clear. No pleural effusion or pneumothorax.  Stable cardiomegaly. Postsurgical changes related to prior CABG.  Left subclavian ICD.  Degenerative changes of the visualized thoracolumbar spine. Cervical spine fixation hardware, incompletely visualized.  IMPRESSION: No evidence of acute cardiopulmonary disease.  Stable cardiomegaly.      Assessment and Plan  76 y.o. male w/ PMHx significant for CAD (s/p multiple cardiac caths/PCIs, CABG w/ re-do in '  06), ICM (EF 35-40%) and VT (s/p BiV ICD '07), H/o A.Fib (s/p ablation), h/o MR (s/p MV repair '06), type 2 DM, HTN, HLD and COPD who presented to [facility] on 08/14/11 w/ c/o jaw/arm pain and ruled in for NSTEMI.  1. CAD/NSTEMI: Cath yesterday revealed severe native multivessel CAD, continued patency of LIMA to LAD & SVG to OM1/OM2, total occlusion of SVG to RCA, chronic occlusion of SVG to Diagonal, and severe segmental LV systolic dysfunction, EF 20%.  Recs were made for med therapy (initiated on Plavix), serial enzymes, and possible staged intervention of SVG to OM branches after cath reviewed with Dr. [name]. CKMB and troponin are trending down. Cont ASA, plavix, BB, ACEI, Imdur, statin. Awaiting Dr. Rosalyn Charters recommendations for further plans.  2. Ischemic Cardiomyopathy: Severe segmental LV systolic dysfunction, EF 20% by cath. No overt volume overload on exam or CXR. Cont oral Lasix, ACEI, and BB. CRT-D at [facility] since 08/02/11, generator change scheduled 09/06/11. Normal device function per interrogation on 08/11/11 office visit.  3. H/o  A.fib/flutter: s/p ablation. Maintained on Tikosyn  4. HTN: Stable on current antihypertensives  5. HLD: LDL 85, goal <70. Per notes, not previously on statin due to pt refusal. Patient states they are expensive and cause leg pain. Discuss with MD.  6. Type 2 DM: Pt reports he rarely checks his blood sugar at home due to expense of supplies. States he was recently started on Metformin by PCP, ~3wks ago. A1C 8.1. Elevated CBGs. Cont insulin regimen. Oral hypoglycemics on hold.   Signed, Remi Lopata PA-C

## 2011-08-16 DIAGNOSIS — I379 Nonrheumatic pulmonary valve disorder, unspecified: Secondary | ICD-10-CM

## 2011-08-16 DIAGNOSIS — I214 Non-ST elevation (NSTEMI) myocardial infarction: Secondary | ICD-10-CM

## 2011-08-16 LAB — GLUCOSE, CAPILLARY: Glucose-Capillary: 180 mg/dL — ABNORMAL HIGH (ref 70–99)

## 2011-08-16 LAB — BASIC METABOLIC PANEL
Chloride: 99 mEq/L (ref 96–112)
GFR calc Af Amer: 66 mL/min — ABNORMAL LOW (ref >90)
GFR calc non Af Amer: 57 mL/min — ABNORMAL LOW (ref >90)
Potassium: 4.3 mEq/L (ref 3.5–5.1)

## 2011-08-16 MED ORDER — METFORMIN HCL 500 MG PO TABS
500.0000 mg | ORAL_TABLET | Freq: Two times a day (BID) | ORAL | Status: DC
  Administered 2011-08-16 – 2011-08-20 (×8): 500 mg via ORAL
  Filled 2011-08-16 (×10): qty 1

## 2011-08-16 NOTE — Progress Notes (Signed)
Pt. C/o right eye itching, sterile saline dropped into eye for moisture. Patient states relief, would like MD to order eye drops since he will be here til Friday or later.  Corey [name], [name]

## 2011-08-16 NOTE — Progress Notes (Signed)
Subjective:  He seems to be doing well.  No current complaints beyond more shortness of breath, which does not surprise me.  Admits to having put off coming in to the hospital.  Data reviewed with patient.  Looks good this am.  Has residual disease involving the other SVG, and we may have to approach that from a PCI standpoint.  Timing is such that we will likely maximize recovery of LV from most recent event.  Notably, patient has refuse warfarin use in the past.    Objective:  Vital Signs in the last 24 hours: Temp:  [97.5 F (36.4 C)-98.2 F (36.8 C)] 98.2 F (36.8 C) (06/12 0730) Pulse Rate:  [70] 70  (06/12 0730) Resp:  [13-22] 16  (06/12 0730) BP: (85-108)/(41-67) 108/67 mmHg (06/12 0730) SpO2:  [93 %-100 %] 97 % (06/12 0730)  Intake/Output from previous day: 06/11 0701 - 06/12 0700 In: 680 [P.O.:680] Out: 1575 [Urine:1575]   Physical Exam: General: Well developed, well nourished, in no acute distress. Head:  Normocephalic and atraumatic. Lungs: Clear to auscultation and percussion. Heart: Normal S1 and S2.  Pos S4.  Apical murmur.   Pulses: Pulses normal in all 4 extremities. Extremities: No clubbing or cyanosis. No edema. Neurologic: Alert and oriented x 3.    Lab Results:  [facility] 08/15/11 0645 08/14/11 1909  WBC 7.3 6.3  HGB 13.6 13.0  PLT 132* 126*    Basename 08/16/11 0550 08/15/11 0645  NA 136 138  K 4.3 4.3  CL 99 99  CO2 27 27  GLUCOSE 188* 189*  BUN 24* 24*  CREATININE 1.22 1.27    Basename 08/15/11 0700 08/14/11 2352  TROPONINI 3.00* 3.81*   Hepatic Function Panel  Basename 08/14/11 1215  PROT 7.0  ALBUMIN 3.6  AST 49*  ALT 24  ALKPHOS 44  BILITOT 0.6  BILIDIR --  IBILI --    Basename 08/15/11 0645  CHOL 141   No results found for this basename: PROTIME in the last 72 hours  Imaging: Dg Chest 2 View  08/14/2011  *RADIOLOGY REPORT*  Clinical Data: Shortness of breath, pacemaker battery dead.  CHEST - 2 VIEW  Comparison:  07/22/2008  Findings: Lungs are essentially clear. No pleural effusion or pneumothorax.  Stable cardiomegaly. Postsurgical changes related to prior CABG.  Left subclavian ICD.  Degenerative changes of the visualized thoracolumbar spine. Cervical spine fixation hardware, incompletely visualized.  IMPRESSION: No evidence of acute cardiopulmonary disease.  Stable cardiomegaly.  Original Report Authenticated By: Charline Bills, M.D.    EKG:  Atrial tracking and ventricular pacing.    Cardiac Studies:  Echo pending at present  Assessment/Plan:  Patient Active Hospital Problem List: NSTEMI (non-ST elevated myocardial infarction) (08/14/2011)   Assessment: likely due to SVG occlusion of RCA graft. Has progression in OM graft as well.     Plan: eventual PCI of SVG to the OM DIABETES MELLITUS (07/11/2008)   Assessment: see DM note   Plan: discuss treatment CARDIOMYOPATHY, ISCHEMIC (07/11/2008)   Assessment: EF is worse   Plan: plan echo today. Atrial fibrillation (07/11/2008)   Assessment: currently in NSR.   Plan: continue dofetilide.   CAD (coronary artery disease) (08/14/2011)   Assessment: two prior CABG and not likely a third redo candidate.   Plan: will discuss with colleagues.       Shawnie Pons, MD, [facility], FSCAI 08/16/2011, 10:33 AM

## 2011-08-16 NOTE — Progress Notes (Signed)
  Echocardiogram 2D Echocardiogram has been performed.  Corey [name] 08/16/2011, 8:41 AM

## 2011-08-16 NOTE — Progress Notes (Signed)
Inpatient Diabetes Program Recommendations  AACE/ADA: New Consensus Statement on Inpatient Glycemic Control (2009)  Target Ranges:  Prepandial:   less than 140 mg/dL      Peak postprandial:   less than 180 mg/dL (1-2 hours)      Critically ill patients:  140 - 180 mg/dL  Results for STOKES, RATTIGAN (MRN [id number]) as of 08/16/2011 09:35  Ref. Range 08/15/2011 07:39 08/15/2011 12:19 08/15/2011 18:02 08/15/2011 21:31 08/16/2011 07:31  Glucose-Capillary Latest Range: 70-99 mg/dL 409 (H) 811 (H) 914 (H) 135 (H) 180 (H)    Inpatient Diabetes Program Recommendations Insulin - Basal: Lantus 10 units  A1C=8.1  Thank you  Piedad Climes [facility] Inpatient Diabetes Coordinator [phone]

## 2011-08-16 NOTE — Progress Notes (Signed)
CARDIAC REHAB PHASE I   PRE:  Rate/Rhythm: 70 paced     BP: sitting left 83/61, right 94/59, standing right 97/61    SaO2: 98 RA  MODE:  Ambulation: 350 ft   POST:  Rate/Rhythm: 74 pacing     BP: sitting right 104/63     SaO2: 94 RA  BP low in left arm, stable in right before walk. Pt tolerated well except had to stop x1 (at same place as yesterday) due to sudden SOB. He sts he had an air bubble that makes his chest feel tight and he can't breathe. Sts once he burps, he feels better. No other problems rest of walk.  [id number]  Harriet Masson CES, ACSM

## 2011-08-17 ENCOUNTER — Inpatient Hospital Stay (HOSPITAL_COMMUNITY): Payer: Medicare Other

## 2011-08-17 DIAGNOSIS — I214 Non-ST elevation (NSTEMI) myocardial infarction: Secondary | ICD-10-CM

## 2011-08-17 DIAGNOSIS — I5022 Chronic systolic (congestive) heart failure: Secondary | ICD-10-CM

## 2011-08-17 DIAGNOSIS — I4891 Unspecified atrial fibrillation: Secondary | ICD-10-CM

## 2011-08-17 LAB — GLUCOSE, CAPILLARY
Glucose-Capillary: 196 mg/dL — ABNORMAL HIGH (ref 70–99)
Glucose-Capillary: 149 mg/dL — ABNORMAL HIGH (ref 70–99)
Glucose-Capillary: 179 mg/dL — ABNORMAL HIGH (ref 70–99)
Glucose-Capillary: 168 mg/dL — ABNORMAL HIGH (ref 70–99)

## 2011-08-17 LAB — BASIC METABOLIC PANEL
CO2: 25 mEq/L (ref 19–32)
Calcium: 8.9 mg/dL (ref 8.4–10.5)
Creatinine, Ser: 1.25 mg/dL (ref 0.50–1.35)
GFR calc Af Amer: 64 mL/min — ABNORMAL LOW (ref >90)
GFR calc non Af Amer: 55 mL/min — ABNORMAL LOW (ref >90)
Sodium: 138 mEq/L (ref 135–145)

## 2011-08-17 MED ORDER — GLIMEPIRIDE 4 MG PO TABS
4.0000 mg | ORAL_TABLET | Freq: Every day | ORAL | Status: DC
  Administered 2011-08-17 – 2011-08-20 (×4): 4 mg via ORAL
  Filled 2011-08-17 (×6): qty 1

## 2011-08-17 NOTE — Progress Notes (Signed)
Subjective:  He gets some burning in the throat with ambulation.  He also is somewhat more SOB with exertion.  Tiksosyn held by nursing staff yesterday----I discussed and reviewed with them today----see [name] note and OV---has paced rhythm so QTc is near baseline.    Objective:  Vital Signs in the last 24 hours: Temp:  [98.1 F (36.7 C)-98.2 F (36.8 C)] 98.1 F (36.7 C) (06/12 2158) Pulse Rate:  [70] 70  (06/13 0615) Resp:  [18] 18  (06/13 0615) BP: (91-115)/(54-72) 112/67 mmHg (06/13 0615) SpO2:  [90 %-94 %] 93 % (06/13 0615) Weight:  [196 lb 3.4 oz (89 kg)] 196 lb 3.4 oz (89 kg) (06/13 0615)  Intake/Output from previous day: 06/12 0701 - 06/13 0700 In: 480 [P.O.:480] Out: -    Physical Exam: General: Well developed, well nourished, in no acute distress. Head:  Normocephalic and atraumatic. Lungs: Clear to auscultation and percussion. Heart: Normal S1 and S2.  Soft apical murmur  Pulses: Pulses normal in all 4 extremities. Extremities: No clubbing or cyanosis. No edema. Neurologic: Alert and oriented x 3.    Lab Results:  [facility] 08/15/11 0645 08/14/11 1909  WBC 7.3 6.3  HGB 13.6 13.0  PLT 132* 126*    Basename 08/17/11 0550 08/16/11 0550  NA 138 136  K 4.3 4.3  CL 101 99  CO2 25 27  GLUCOSE 159* 188*  BUN 28* 24*  CREATININE 1.25 1.22    Basename 08/15/11 0700 08/14/11 2352  TROPONINI 3.00* 3.81*   Hepatic Function Panel  Basename 08/14/11 1215  PROT 7.0  ALBUMIN 3.6  AST 49*  ALT 24  ALKPHOS 44  BILITOT 0.6  BILIDIR --  IBILI --    Basename 08/15/11 0645  CHOL 141   No results found for this basename: PROTIME in the last 72 hours  Imaging: No results found.  EKG:  Reviewed along with QTc from prior studies.      Assessment/Plan:  Patient Active Hospital Problem List: NSTEMI (non-ST elevated myocardial infarction) (08/14/2011)   Assessment: Occluded SVG to the RCA with collaterals with progression in OM graft.    Plan: continue  medical therapy.  He is not a good candidate for second redo, and he does not really want either.  I likely would favor PCI of remaining SVG---fairly high grade lesion in remaining OM graft.  Would do after possible chance of muscle recovery as lesion looks stable.   DIABETES MELLITUS (07/11/2008)   Assessment: elevated glucoses   Plan: Metformin restarted.  Will resume Amaryl at lower dose then titrate up when home.  CARDIOMYOPATHY, ISCHEMIC (07/11/2008)   Assessment: reduced LV from last echo.   Plan: continue therapy Atrial fibrillation (07/11/2008)   Assessment: no recurrence    Plan: continue Tikosyn CAD (coronary artery disease) (08/14/2011)   Assessment: see above.    Plan: plan as outlined.        Shawnie Pons, MD, [facility], FSCAI 08/17/2011, 10:14 AM

## 2011-08-17 NOTE — Progress Notes (Signed)
CARDIAC REHAB PHASE I   PRE:  Rate/Rhythm: 72 pacing     BP: sitting 114/70    SaO2:   MODE:  Ambulation: 840 ft   POST:  Rate/Rhythm: 72    BP: sitting 122/76     SaO2: 96 RA  Tolerated well, increased distance. Had one episode of belching/SOB. Very short duration. Feels good. Will f/u. [phone]  Harriet Masson CES, ACSM

## 2011-08-18 LAB — GLUCOSE, CAPILLARY
Glucose-Capillary: 158 mg/dL — ABNORMAL HIGH (ref 70–99)
Glucose-Capillary: 104 mg/dL — ABNORMAL HIGH (ref 70–99)

## 2011-08-18 LAB — MAGNESIUM: Magnesium: 2.2 mg/dL (ref 1.5–2.5)

## 2011-08-18 LAB — BASIC METABOLIC PANEL
CO2: 27 mEq/L (ref 19–32)
Chloride: 101 mEq/L (ref 96–112)
GFR calc Af Amer: 57 mL/min — ABNORMAL LOW (ref >90)
Potassium: 4.2 mEq/L (ref 3.5–5.1)
Sodium: 139 mEq/L (ref 135–145)

## 2011-08-18 MED ORDER — MAGNESIUM HYDROXIDE 400 MG/5ML PO SUSP
30.0000 mL | Freq: Every day | ORAL | Status: DC | PRN
  Administered 2011-08-18: 30 mL via ORAL
  Filled 2011-08-18: qty 30

## 2011-08-18 NOTE — Progress Notes (Signed)
Patient Name: Corey [name] Date of Encounter: 08/18/2011     Principal Problem:  *NSTEMI (non-ST elevated myocardial infarction) Active Problems:  DIABETES MELLITUS  HYPERLIPIDEMIA-MIXED  CARDIOMYOPATHY, ISCHEMIC  Atrial fibrillation  CAD (coronary artery disease)  Hypertension    SUBJECTIVE  Still has moderate shortness of breath with activity.  Also some tightness in the throat, which is his anginal equivalent.    CURRENT MEDS    . aspirin EC  81 mg Oral Daily  . atorvastatin  40 mg Oral q1800  . benazepril  20 mg Oral Daily  . Chlorhexidine Gluconate Cloth  6 each Topical Q0600  . clopidogrel  75 mg Oral Q breakfast  . dofetilide  500 mcg Oral Q12H  . enoxaparin  40 mg Subcutaneous Q24H  . furosemide  40 mg Oral BID  . glimepiride  4 mg Oral Q breakfast  . insulin aspart  0-15 Units Subcutaneous TID WC  . insulin aspart  0-5 Units Subcutaneous QHS  . insulin aspart  3 Units Subcutaneous TID WC  . isosorbide dinitrate  30 mg Oral Daily  . metFORMIN  500 mg Oral BID WC  . metoprolol  25 mg Oral BID  . mupirocin ointment   Nasal BID  . potassium chloride SA  20 mEq Oral Daily    OBJECTIVE  Filed Vitals:   08/17/11 0615 08/17/11 1332 08/17/11 2046 08/18/11 0459  BP: 112/67 100/63 106/59 107/70  Pulse: 70 70 70 69  Temp:  98.2 F (36.8 C) 97.7 F (36.5 C) 97.7 F (36.5 C)  TempSrc: Oral  Oral Oral  Resp: 18 18 18 18   Height:      Weight: 196 lb 3.4 oz (89 kg)   196 lb 6.4 oz (89.086 kg)  SpO2: 93% 93% 91% 94%    Intake/Output Summary (Last 24 hours) at 08/18/11 1104 Last data filed at 08/18/11 0509  Gross per 24 hour  Intake    840 ml  Output    650 ml  Net    190 ml   Filed Weights   08/15/11 0436 08/17/11 0615 08/18/11 0459  Weight: 198 lb 13.7 oz (90.2 kg) 196 lb 3.4 oz (89 kg) 196 lb 6.4 oz (89.086 kg)    PHYSICAL EXAM  General: Pleasant, NAD. Neuro: Alert and oriented X 3. Moves all extremities spontaneously. Psych: Normal  affect. HEENT:  Normal  Neck: Moderate JVD noted on exam.   Lungs:  Resp regular and unlabored, CTA. Heart: RRR, pos S4.  Apical murmur  Abdomen: Soft, non-tender, non-distended, BS + x 4.  Extremities: No clubbing, cyanosis or edema. DP/PT/Radials 2+ and equal bilaterally.  Accessory Clinical Findings  CBC No results found for this basename: WBC:2,NEUTROABS:2,HGB:2,HCT:2,MCV:2,PLT:2 in the last 72 hours Basic Metabolic Panel  Basename 08/18/11 0500 08/17/11 0550  NA 139 138  K 4.2 4.3  CL 101 101  CO2 27 25  GLUCOSE 137* 159*  BUN 31* 28*  CREATININE 1.37* 1.25  CALCIUM 9.3 8.9  MG 2.2 --  PHOS -- --   Liver Function Tests No results found for this basename: AST:2,ALT:2,ALKPHOS:2,BILITOT:2,PROT:2,ALBUMIN:2 in the last 72 hours No results found for this basename: LIPASE:2,AMYLASE:2 in the last 72 hours Cardiac Enzymes No results found for this basename: CKTOTAL:3,CKMB:3,CKMBINDEX:3,TROPONINI:3 in the last 72 hours BNP No components found with this basename: POCBNP:3 D-Dimer No results found for this basename: DDIMER:2 in the last 72 hours Hemoglobin A1C No results found for this basename: HGBA1C in the last 72 hours Fasting Lipid  Panel No results found for this basename: CHOL,HDL,LDLCALC,TRIG,CHOLHDL,LDLDIRECT in the last 72 hours Thyroid Function Tests No results found for this basename: TSH,T4TOTAL,FREET3,T3FREE,THYROIDAB in the last 72 hours    Radiology/Studies  Dg Chest 2 View  08/17/2011  *RADIOLOGY REPORT*  Clinical Data: Chest heart failure  CHEST - 2 VIEW  Comparison: Plain film 08/14/2011  Findings: Left-sided pacemaker with continuous leads overlies stable cardiac silhouette.  No effusion, infiltrate, or pneumothorax. Degenerative osteophytosis of the thoracic spine.  IMPRESSION: Cardiomegaly without acute cardiopulmonary process.  Original Report Authenticated By: Genevive Bi, M.D.   Dg Chest 2 View  08/14/2011  *RADIOLOGY REPORT*  Clinical Data:  Shortness of breath, pacemaker battery dead.  CHEST - 2 VIEW  Comparison: 07/22/2008  Findings: Lungs are essentially clear. No pleural effusion or pneumothorax.  Stable cardiomegaly. Postsurgical changes related to prior CABG.  Left subclavian ICD.  Degenerative changes of the visualized thoracolumbar spine. Cervical spine fixation hardware, incompletely visualized.  IMPRESSION: No evidence of acute cardiopulmonary disease.  Stable cardiomegaly.  Original Report Authenticated By: Charline Bills, M.D.    ASSESSMENT AND PLAN  1.  Non STEMI  -  Secondary to graft occlusion.  Still with some angina.  Not a good third redo candidate----distal targets not good.  Has occluded RCA graft with collaterals, and some progression in the OM graft.  Will plan to resume nitrates, and continue ambulation 2.  Non ischemic CM  --  Device in place.  Still short of breath with exertion.  LV is down clearly from baseline.  Will continue to monitor.  Need to watch furosemide due to slight rise in BUN/Cr.  Defer Dc today.  3.  Atrial fib  -  Maintained on dofetilide.       Signed, Shawnie Pons MD, [facility], FSCAI

## 2011-08-18 NOTE — Progress Notes (Signed)
[id number] Cardiac Rehab Pt is walking independently in hall. Completed CHF and MI education with pt.

## 2011-08-19 LAB — BASIC METABOLIC PANEL
BUN: 29 mg/dL — ABNORMAL HIGH (ref 6–23)
CO2: 27 mEq/L (ref 19–32)
Calcium: 9.6 mg/dL (ref 8.4–10.5)
Creatinine, Ser: 1.32 mg/dL (ref 0.50–1.35)
Glucose, Bld: 196 mg/dL — ABNORMAL HIGH (ref 70–99)
Sodium: 137 mEq/L (ref 135–145)

## 2011-08-19 LAB — GLUCOSE, CAPILLARY: Glucose-Capillary: 67 mg/dL — ABNORMAL LOW (ref 70–99)

## 2011-08-19 MED ORDER — ISOSORBIDE MONONITRATE ER 60 MG PO TB24
60.0000 mg | ORAL_TABLET | Freq: Every day | ORAL | Status: DC
  Administered 2011-08-19: 60 mg via ORAL
  Filled 2011-08-19 (×2): qty 1

## 2011-08-19 NOTE — Progress Notes (Signed)
Patient has walked multiple times today with wife and has tolerated well.  Corey [name] [facility]

## 2011-08-19 NOTE — Progress Notes (Signed)
Cardiac Rehab Phase I  Pt declined walk with [place] as he has been walking independently and with his wife.  We will sign off at this time. Fabio Pierce, MA, ACSM RCEP

## 2011-08-19 NOTE — Progress Notes (Signed)
   SUBJECTIVE:  Still with some chest pain with ambulation.  SOB with ambulation as well.  Not quite at baseline   PHYSICAL EXAM Filed Vitals:   08/18/11 0459 08/18/11 1331 08/18/11 2007 08/19/11 0504  BP: 107/70 87/52 104/60 107/73  Pulse: 69 70 70 70  Temp: 97.7 F (36.5 C) 98.3 F (36.8 C) 98.2 F (36.8 C) 97.7 F (36.5 C)  TempSrc: Oral Oral Oral Oral  Resp: 18 18 18 20   Height:      Weight: 196 lb 6.4 oz (89.086 kg)   191 lb 12.8 oz (87 kg)  SpO2: 94% 94% 96% 95%   General:  No distess Lungs:  Clear Heart:  RRR Abdomen:  Positive bowel sounds, no rebound no guarding Extremities:  No edema  LABS:  Results for orders placed during the hospital encounter of 08/14/11 (from the past 24 hour(s))  GLUCOSE, CAPILLARY     Status: Abnormal   Collection Time   08/18/11 11:33 AM      Component Value Range   Glucose-Capillary 158 (*) 70 - 99 mg/dL   Comment 1 Documented in Chart     Comment 2 Notify RN    GLUCOSE, CAPILLARY     Status: Abnormal   Collection Time   08/18/11  4:22 PM      Component Value Range   Glucose-Capillary 104 (*) 70 - 99 mg/dL   Comment 1 Documented in Chart     Comment 2 Notify RN    GLUCOSE, CAPILLARY     Status: Abnormal   Collection Time   08/18/11  9:11 PM      Component Value Range   Glucose-Capillary 190 (*) 70 - 99 mg/dL  GLUCOSE, CAPILLARY     Status: Abnormal   Collection Time   08/19/11  6:31 AM      Component Value Range   Glucose-Capillary 145 (*) 70 - 99 mg/dL    Intake/Output Summary (Last 24 hours) at 08/19/11 0934 Last data filed at 08/19/11 0503  Gross per 24 hour  Intake    240 ml  Output   1000 ml  Net   -760 ml    ASSESSMENT AND PLAN:    1. CAD/NSTEMI- Medical management as per Dr. Rosalyn Charters notes. I will increase the nitrates.  Not ready for discharge today.  2. Ischemic cardiomyopathy- Volume seems OK.  Check a BMET today.  3. History of atrial fibrillation/flutter- s/p ablation. Maintained on Tikosyn.   4. Type  2 DM- Continue current therapy.    5. Hypertension - Continue above antihypertensives   6. Hyperlipidemia - Now on Lipitor  7. COPD- stable    Rollene Rotunda 08/19/2011 9:34 AM

## 2011-08-20 ENCOUNTER — Encounter (HOSPITAL_COMMUNITY): Payer: Self-pay | Admitting: Cardiology

## 2011-08-20 LAB — GLUCOSE, CAPILLARY: Glucose-Capillary: 115 mg/dL — ABNORMAL HIGH (ref 70–99)

## 2011-08-20 MED ORDER — NITROGLYCERIN 0.4 MG SL SUBL: 0.4000 mg | SUBLINGUAL_TABLET | SUBLINGUAL | Status: DC | PRN

## 2011-08-20 MED ORDER — CLOPIDOGREL BISULFATE 75 MG PO TABS: 75.0000 mg | ORAL_TABLET | Freq: Every day | ORAL | Status: DC

## 2011-08-20 MED ORDER — ATORVASTATIN CALCIUM 40 MG PO TABS: 40.0000 mg | ORAL_TABLET | Freq: Every day | ORAL | Status: DC

## 2011-08-20 MED ORDER — ISOSORBIDE MONONITRATE ER 60 MG PO TB24: 60.0000 mg | ORAL_TABLET | Freq: Every day | ORAL | Status: DC

## 2011-08-20 NOTE — Discharge Summary (Signed)
DC'd patient home with friend.  RN reviewed DC instructions and med rec with patient.  Patient verbalized understanding of follow up appointment with MD.  RN removed IV with catheter intact.  Lodema Pilot [facility]

## 2011-08-20 NOTE — Discharge Summary (Signed)
Discharge Summary   Patient ID: Corey [name] MRN: [id number], DOB/AGE: 1935/12/20 76 y.o.  Primary MD: Park Pope, MD Primary Cardiologist: Dr. Marcellus Scott. [name] (EP)  Admit date: 08/14/2011 D/C date:     08/20/2011     Primary Discharge Diagnoses:  1. NSTEMI/CAD  - H/o multiple caths/PCIs and 4v CABG with redo '06  - NSTEMI this admission; cath revealed high grade lesion in OM graft, total occlusion of SVG to RCA with collaterals,  and severe segmental LV systolic dysfunction, EF 20%  - Plans for medical management and eventual PCI of SVG to OM  2. Hyperlipidemia  - H/o intolerance to statins   - LDL 85, initiated on Lipitor  - Will need f/u lipids/LFTs in 6-8wks  3. Ischemic Cardiomyopathy   - s/p BiV ICD '07; Per outpt notes: CRT-D at [facility] since 08/02/11, generator change scheduled 09/06/11  - EF 20% by cath 25% by echo  Secondary Discharge Diagnoses:  1. H/o VT s/p BiV ICD '07 2. A.fib/flutter s/p ablation, on tikosyn 3. H/o MR s/p MV repair '06 4. HTN 5. DMII 6. COPD 7. Inguinal hernia repair  8. Cervical discectomy C3-C4   Allergies Allergies  Allergen Reactions  . Codeine     "made skin crawl"  . Lorazepam Nausea Only    "became unresponsive"    Diagnostic Studies/Procedures:    08/14/11 - Cardiac Cath  Hemodynamics:  AO 101/63 with a mean of 78  LV 101/25  Coronary angiography:  Coronary dominance: right  Left mainstem: The left main is patent with moderate calcification. The vessel is diffusely diseased with 50% stenosis present. It divides into the LAD and left circumflex.  Left anterior descending (LAD): The LAD is severely diseased at the first septal perforator. The vessel goes on to occlude in the midportion.  Left circumflex (LCx): The circumflex is fairly small in caliber. The vessel has severe diffuse disease with total occlusion in the midportion of the AV groove. There is an intermediate branch with moderately severe diffuse stenosis.    Right coronary artery (RCA): The RCA is totally occluded. There are ridging collaterals supplying the distal RCA with a well formed collateral to the posterolateral branch retrograde filling the mid and distal RCA and the RV marginal branch. There are also left to right collaterals supplying the distal RCA circulation.  LIMA to LAD: The vessel remains patent. The LAD Is small and diffusely diseased beyond the LIMA insertion site  Saphenous vein graft to distal RCA: The vein graft is heavily stented. It is totally occluded in the proximal aspect.  Saphenous vein graft to diagonal: Chronically occluded and unchanged from previous study.  Saphenous vein graft sequence to OM1 and OM 2: The vein graft is patent throughout. The distal anastomotic sites are intact. There is a focal in lesion in the distal body of the graft prior to the insertion of the first OM. This lesion appears 50% stenotic in some views and up to 75% in other views. The distal OM branches supply left to right collaterals to the distal RCA territory.  Left ventriculography: There is very severe left ventricular segmental systolic dysfunction. The entire apex is akinetic. The basal anterior wall and basal inferior wall are hypokinetic. The estimated left ventricular ejection fraction is 20%.  Final Conclusions:  1. Severe native multivessel coronary artery disease  2. Status post CABG with continued patency of the LIMA to LAD and continued patency of the saphenous vein graft sequence to OM1 and OM 2.  3. Total occlusion of the saphenous vein graft to right coronary artery  4. Chronic occlusion of the saphenous vein graft to diagonal  5. Severe segmental LV systolic dysfunction  Recommendations: Recommend medical therapy and serial enzymes to help quantify the extent of this patient's acute myocardial infarction. He clearly has developed severe left ventricular dysfunction. Consideration will be given to staged intervention of the saphenous  vein graft to obtuse marginal branches. The lesion appears borderline and I will review it with Dr. [name]. Will start the patient on Plavix for further medical treatment of his non-ST elevation infarction.  08/16/11 - 2D Echocardiogram Study Conclusions: - Left ventricle: Diffuse hypokinesis. septal and apical akinesis The cavity size was severely dilated. Wall thickness was normal. The estimated ejection fraction was 25%. - Mitral valve: S/P repair of mitral valve with trivial residual MR - Left atrium: The atrium was moderately dilated. - Right atrium: The atrium was mildly dilated. - Atrial septum: No defect or patent foramen ovale was identified. - Pulmonary arteries: PA peak pressure: 63mm Hg (S).  History of Present Illness: 76 y.o. male w/ the above medical problems who presented to [facility] on [facility]11 with complaints of jaw/arm pain and ruled in for NSTEMI.  Hospital Course: EKG revealed -V paced rhythm at 70 bpm. CXR was without acute cardiopulmonary abnormalities. Labs were significant for poc troponin 1.84, BNP 2902, glucose 428, Crt 1.24. He was placed on IV heparin. His next set of enzymes revealed troponin of 4.38 for which he was taken to the cath lab for further ischemic evaluation.   He underwent cardiac cath revealing severe native multivessel CAD, continued patency of LIMA to LAD & SVG to OM1/OM2, total occlusion of SVG to RCA with collaterals, chronic occlusion of SVG to Diagonal, and severe segmental LV systolic dysfunction, EF 20%. He tolerated the procedure well without complications. It was felt his NSTEMI was likely due to occlusion of SVG to RCA graft. Recommendations were made for medical therapy and possible staged intervention of SVG to OM branches after cath reviewed with Dr. [name]. Not felt to be a good redo candidate as distal targets are not suitable. Echo revealed diffuse hypokinesis, septal & apical akinesis, severely dilated LV, EF 25%, mod LAE, mild  RAE, PASP . Dr. [name] recommended medical therapy initially to maximize recovery of LV from this recent event with eventual PCI of SVG to OM as there appeared to be a high grade lesion. He was initiated on Plavix, Imdur, and Lipitor. He was not previously on a statin due to patient c/o leg pain and high cost. He will need f/u lipids/LFTs in 6-8wks. His cardiac enzymes trended down. He maintained NSR on tikosyn. He was able to ambulate with cardiac rehab and independently without chest pain and mild sob.  He was seen and evaluated by Dr. Antoine Poche who felt he was stable for discharge home with plans for follow up as scheduled below.  Discharge Vitals: Blood pressure 104/66, pulse 71, temperature 98.5 F (36.9 C), temperature source Oral, resp. rate 16, height 5\' 9"  (1.753 m), weight 194 lb 8 oz (88.225 kg), SpO2 95.00%.  Labs: Component Value Date   WBC 7.3 08/15/2011   HGB 13.6 08/15/2011   HCT 40.4 08/15/2011   MCV 94.2 08/15/2011   PLT 132* 08/15/2011    Lab 08/19/11 1030 08/14/11 1215  NA 137 --  K 4.2 --  CL 98 --  CO2 27 --  BUN 29* --  CREATININE 1.32 --  CALCIUM  9.6 --  PROT -- 7.0  BILITOT -- 0.6  ALKPHOS -- 44  ALT -- 24  AST -- 49*  GLUCOSE 196* --   Component Value Date   CHOL 141 08/15/2011   HDL 26* 08/15/2011   LDLCALC 85 08/15/2011   TRIG 148 08/15/2011     08/14/2011 12:15  Pro B Natriuretic peptide (BNP) 2902.0 (H)     08/14/2011 13:16 08/14/2011 19:09 08/14/2011 23:52 08/15/2011 07:00  CK, MB 13.7 (HH) 12.3 (HH) 9.3 (HH) 7.5 (HH)  CK Total 252 (H) 212 178 149  Troponin I 4.38 (HH) 3.51 (HH) 3.81 (HH) 3.00 (HH)     08/14/2011 19:09  Hemoglobin A1C 8.1 (H)    Discharge Medications   Medication List  As of 08/20/2011 10:10 AM   STOP taking these medications         isosorbide dinitrate 30 MG tablet         TAKE these medications         aspirin 81 MG tablet   Take 81 mg by mouth daily.      atorvastatin 40 MG tablet   Commonly known as: LIPITOR    Take 1 tablet (40 mg total) by mouth at bedtime.      benazepril 20 MG tablet   Commonly known as: LOTENSIN   Take 1 tablet (20 mg total) by mouth daily.      clopidogrel 75 MG tablet   Commonly known as: PLAVIX   Take 1 tablet (75 mg total) by mouth daily with breakfast.      dofetilide 500 MCG capsule   Commonly known as: TIKOSYN   Take 1 capsule (500 mcg total) by mouth 2 (two) times daily.      fluticasone 50 MCG/ACT nasal spray   Commonly known as: FLONASE   Place 1 spray into the nose as needed.      furosemide 40 MG tablet   Commonly known as: LASIX   Take 1 tablet (40 mg total) by mouth 2 (two) times daily.      glimepiride 4 MG tablet   Commonly known as: AMARYL   Take 8 mg by mouth daily before breakfast.      isosorbide mononitrate 60 MG 24 hr tablet   Commonly known as: IMDUR   Take 1 tablet (60 mg total) by mouth daily.      metFORMIN 500 MG tablet   Commonly known as: GLUCOPHAGE   Take 500 mg by mouth every evening.      metoprolol 50 MG tablet   Commonly known as: LOPRESSOR   Take 25 mg by mouth 2 (two) times daily. 1/2 tab po bid      nitroGLYCERIN 0.4 MG SL tablet   Commonly known as: NITROSTAT   Place 1 tablet (0.4 mg total) under the tongue every 5 (five) minutes as needed for chest pain (up to 3 doses).      potassium chloride SA 20 MEQ tablet   Commonly known as: K-DUR,KLOR-CON   Take 20 mEq by mouth daily. Taking 1 1/2 tablets daily      saxagliptin HCl 2.5 MG Tabs tablet   Commonly known as: ONGLYZA   Take 5 mg by mouth every evening.      triazolam 0.25 MG tablet   Commonly known as: HALCION   Take 0.25 mg by mouth at bedtime as needed. For sleep            Disposition   Discharge Orders  Future Appointments: Provider: Department: Dept Phone: Center:   09/18/2011 9:15 AM Herby Abraham, MD [facility] [phone] [facility]     Future Orders Please Complete By Expires   Diet - low sodium heart healthy       Increase activity slowly      Discharge instructions      Comments:   **PLEASE REMEMBER TO BRING ALL OF YOUR MEDICATIONS TO EACH OF YOUR FOLLOW-UP OFFICE VISITS.  * KEEP GROIN SITE CLEAN AND DRY. Call the office for any signs of bleedings, pus, swelling, increased pain, or any other concerns. * NO HEAVY LIFTING (>10lbs) X 1 WEEK * NO SEXUAL ACTIVITY X 1WEEK * NO DRIVING X 1 DAY * NO SOAKING BATHS, HOT TUBS, POOLS, ETC., X 1 DAY     Follow-up Information    Follow up with Shawnie Pons, MD. (Our office will call you with your appointment time)    Contact information:   [facility] [address] [place] [address] [phone]           Outstanding Labs/Studies:  1. Patient will require follow up lipid panel and liver function tests in 6-8 weeks as we initiated a new statin during this hospitalization   Duration of Discharge Encounter: Greater than 30 minutes including physician and PA time.  Signed, HOPE, JESSICA PA-C 08/20/2011, 10:10 AM  Patient seen and examined.  Plan as discussed in my rounding note for today and outlined above. Fayrene Fearing Royston Bekele  08/20/2011  10:50 AM

## 2011-08-20 NOTE — Progress Notes (Signed)
   SUBJECTIVE:  Ambulated quite a bit yesterday.  Breathing better and no further chest pain.  He wants to go home.   PHYSICAL EXAM Filed Vitals:   08/19/11 1617 08/19/11 1624 08/19/11 1959 08/20/11 0504  BP: 88/52 98/58 89/49  104/66  Pulse: 70  70 71  Temp: 98.3 F (36.8 C)  98.6 F (37 C) 98.5 F (36.9 C)  TempSrc: Oral  Oral Oral  Resp: 18  18 16   Height:      Weight:    194 lb 8 oz (88.225 kg)  SpO2: 94%  93% 95%   General:  No distess Lungs:  Clear Heart:  RRR Abdomen:  Positive bowel sounds, no rebound no guarding Extremities:  No edema  LABS:  Results for orders placed during the hospital encounter of 08/14/11 (from the past 24 hour(s))  BASIC METABOLIC PANEL     Status: Abnormal   Collection Time   08/19/11 10:30 AM      Component Value Range   Sodium 137  135 - 145 mEq/L   Potassium 4.2  3.5 - 5.1 mEq/L   Chloride 98  96 - 112 mEq/L   CO2 27  19 - 32 mEq/L   Glucose, Bld 196 (*) 70 - 99 mg/dL   BUN 29 (*) 6 - 23 mg/dL   Creatinine, Ser 4.09  0.50 - 1.35 mg/dL   Calcium 9.6  8.4 - 81.1 mg/dL   GFR calc non Af Amer 52 (*) >90 mL/min   GFR calc Af Amer 60 (*) >90 mL/min  GLUCOSE, CAPILLARY     Status: Abnormal   Collection Time   08/19/11 11:32 AM      Component Value Range   Glucose-Capillary 151 (*) 70 - 99 mg/dL   Comment 1 Notify RN    GLUCOSE, CAPILLARY     Status: Normal   Collection Time   08/19/11  4:18 PM      Component Value Range   Glucose-Capillary 74  70 - 99 mg/dL   Comment 1 Notify RN    GLUCOSE, CAPILLARY     Status: Abnormal   Collection Time   08/19/11  9:34 PM      Component Value Range   Glucose-Capillary 67 (*) 70 - 99 mg/dL   Comment 1 Notify RN    GLUCOSE, CAPILLARY     Status: Abnormal   Collection Time   08/20/11  6:25 AM      Component Value Range   Glucose-Capillary 115 (*) 70 - 99 mg/dL   Comment 1 Notify RN      Intake/Output Summary (Last 24 hours) at 08/20/11 0901 Last data filed at 08/20/11 0505  Gross per 24 hour    Intake    480 ml  Output   1200 ml  Net   -720 ml    ASSESSMENT AND PLAN:    1. CAD/NSTEMI- Medical management as per Dr. Rosalyn Charters notes. I increased the nitrates yesterday.  Feeling better.  No chest pain.    2. Ischemic cardiomyopathy- Volume seems OK.  BMET OK yesterday.    3. History of atrial fibrillation/flutter- s/p ablation. Maintained on Tikosyn.   4. Type 2 DM- Continue current therapy.    5. Hypertension - Continue above antihypertensives   6. Hyperlipidemia - Now on Lipitor  7. COPD- stable    Rollene Rotunda 08/20/2011 9:01 AM

## 2011-08-23 ENCOUNTER — Telehealth: Payer: Self-pay | Admitting: Cardiology

## 2011-08-23 ENCOUNTER — Encounter (HOSPITAL_COMMUNITY): Payer: Self-pay | Admitting: *Deleted

## 2011-08-23 ENCOUNTER — Other Ambulatory Visit: Payer: Self-pay

## 2011-08-23 ENCOUNTER — Inpatient Hospital Stay (HOSPITAL_COMMUNITY)
Admission: EM | Admit: 2011-08-23 | Discharge: 2011-08-31 | DRG: 302 | Disposition: A | Discharge status: Home / Self Care | Payer: Medicare Other | Attending: Cardiology | Admitting: Cardiology

## 2011-08-23 ENCOUNTER — Emergency Department (HOSPITAL_COMMUNITY): Payer: Medicare Other

## 2011-08-23 DIAGNOSIS — E782 Mixed hyperlipidemia: Secondary | ICD-10-CM | POA: Diagnosis present

## 2011-08-23 DIAGNOSIS — I25810 Atherosclerosis of coronary artery bypass graft(s) without angina pectoris: Principal | ICD-10-CM

## 2011-08-23 DIAGNOSIS — I509 Heart failure, unspecified: Secondary | ICD-10-CM | POA: Diagnosis present

## 2011-08-23 DIAGNOSIS — I5023 Acute on chronic systolic (congestive) heart failure: Secondary | ICD-10-CM | POA: Diagnosis present

## 2011-08-23 DIAGNOSIS — J449 Chronic obstructive pulmonary disease, unspecified: Secondary | ICD-10-CM | POA: Diagnosis present

## 2011-08-23 DIAGNOSIS — J4489 Other specified chronic obstructive pulmonary disease: Secondary | ICD-10-CM | POA: Diagnosis present

## 2011-08-23 DIAGNOSIS — I2589 Other forms of chronic ischemic heart disease: Secondary | ICD-10-CM

## 2011-08-23 DIAGNOSIS — I2510 Atherosclerotic heart disease of native coronary artery without angina pectoris: Secondary | ICD-10-CM

## 2011-08-23 DIAGNOSIS — Z7982 Long term (current) use of aspirin: Secondary | ICD-10-CM

## 2011-08-23 DIAGNOSIS — I214 Non-ST elevation (NSTEMI) myocardial infarction: Secondary | ICD-10-CM

## 2011-08-23 DIAGNOSIS — Z95810 Presence of automatic (implantable) cardiac defibrillator: Secondary | ICD-10-CM

## 2011-08-23 DIAGNOSIS — E785 Hyperlipidemia, unspecified: Secondary | ICD-10-CM

## 2011-08-23 DIAGNOSIS — I5022 Chronic systolic (congestive) heart failure: Secondary | ICD-10-CM

## 2011-08-23 DIAGNOSIS — I4891 Unspecified atrial fibrillation: Secondary | ICD-10-CM

## 2011-08-23 DIAGNOSIS — I208 Other forms of angina pectoris: Secondary | ICD-10-CM | POA: Diagnosis present

## 2011-08-23 DIAGNOSIS — I10 Essential (primary) hypertension: Secondary | ICD-10-CM

## 2011-08-23 DIAGNOSIS — Z87891 Personal history of nicotine dependence: Secondary | ICD-10-CM

## 2011-08-23 DIAGNOSIS — E119 Type 2 diabetes mellitus without complications: Secondary | ICD-10-CM

## 2011-08-23 DIAGNOSIS — I252 Old myocardial infarction: Secondary | ICD-10-CM

## 2011-08-23 DIAGNOSIS — I200 Unstable angina: Secondary | ICD-10-CM

## 2011-08-23 HISTORY — DX: Unspecified osteoarthritis, unspecified site: M19.90

## 2011-08-23 HISTORY — DX: Type 2 diabetes mellitus without complications: E11.9

## 2011-08-23 HISTORY — DX: Other complications of anesthesia, initial encounter: T88.59XA

## 2011-08-23 HISTORY — DX: Adverse effect of unspecified anesthetic, initial encounter: T41.45XA

## 2011-08-23 LAB — BASIC METABOLIC PANEL
CO2: 26 mEq/L (ref 19–32)
Chloride: 101 mEq/L (ref 96–112)
Creatinine, Ser: 1.38 mg/dL — ABNORMAL HIGH (ref 0.50–1.35)
GFR calc Af Amer: 57 mL/min — ABNORMAL LOW (ref >90)
Potassium: 4.3 mEq/L (ref 3.5–5.1)

## 2011-08-23 LAB — PROTIME-INR
INR: 1.24 (ref 0.00–1.49)
Prothrombin Time: 15.9 seconds — ABNORMAL HIGH (ref 11.6–15.2)

## 2011-08-23 LAB — CBC
HCT: 37.4 % — ABNORMAL LOW (ref 39.0–52.0)
MCH: 31.5 pg (ref 26.0–34.0)
MCHC: 34 g/dL (ref 30.0–36.0)
MCV: 93.3 fL (ref 78.0–100.0)
Platelets: 158 10*3/uL (ref 150–400)
Platelets: 167 10*3/uL (ref 150–400)
RBC: 4.01 MIL/uL — ABNORMAL LOW (ref 4.22–5.81)
RDW: 15.3 % (ref 11.5–15.5)
RDW: 15.2 % (ref 11.5–15.5)
WBC: 5.9 10*3/uL (ref 4.0–10.5)

## 2011-08-23 LAB — COMPREHENSIVE METABOLIC PANEL
ALT: 77 U/L — ABNORMAL HIGH (ref 0–53)
AST: 46 U/L — ABNORMAL HIGH (ref 0–37)
Calcium: 9.1 mg/dL (ref 8.4–10.5)
Creatinine, Ser: 1.28 mg/dL (ref 0.50–1.35)
GFR calc Af Amer: 62 mL/min — ABNORMAL LOW (ref >90)
Sodium: 138 mEq/L (ref 135–145)
Total Protein: 6.6 g/dL (ref 6.0–8.3)

## 2011-08-23 LAB — DIFFERENTIAL
Basophils Relative: 1 % (ref 0–1)
Eosinophils Absolute: 0.1 10*3/uL (ref 0.0–0.7)
Neutrophils Relative %: 60 % (ref 43–77)

## 2011-08-23 LAB — GLUCOSE, CAPILLARY: Glucose-Capillary: 252 mg/dL — ABNORMAL HIGH (ref 70–99)

## 2011-08-23 LAB — PRO B NATRIURETIC PEPTIDE: Pro B Natriuretic peptide (BNP): 2047 pg/mL — ABNORMAL HIGH (ref 0–125)

## 2011-08-23 LAB — CARDIAC PANEL(CRET KIN+CKTOT+MB+TROPI)
CK, MB: 2 ng/mL (ref 0.3–4.0)
Troponin I: <0.3 ng/mL (ref <0.30)

## 2011-08-23 LAB — POCT I-STAT TROPONIN I: Troponin i, poc: 0.13 ng/mL (ref 0.00–0.08)

## 2011-08-23 MED ORDER — LOPERAMIDE HCL 2 MG PO CAPS: 2.0000 mg | ORAL_CAPSULE | ORAL | Status: DC | PRN

## 2011-08-23 MED ORDER — GLIMEPIRIDE 4 MG PO TABS
8.0000 mg | ORAL_TABLET | Freq: Every day | ORAL | Status: DC
  Administered 2011-08-24 – 2011-08-31 (×7): 8 mg via ORAL
  Filled 2011-08-23 (×9): qty 2

## 2011-08-23 MED ORDER — PRAMOXINE-ZINC OXIDE IN MO 1-12.5 % RE OINT
1.0000 "application " | TOPICAL_OINTMENT | Freq: Three times a day (TID) | RECTAL | Status: DC | PRN
  Filled 2011-08-23: qty 28.3

## 2011-08-23 MED ORDER — NITROGLYCERIN 0.4 MG SL SUBL: 0.4000 mg | SUBLINGUAL_TABLET | SUBLINGUAL | Status: DC | PRN

## 2011-08-23 MED ORDER — ASPIRIN EC 81 MG PO TBEC
81.0000 mg | DELAYED_RELEASE_TABLET | Freq: Every day | ORAL | Status: DC
  Administered 2011-08-24 – 2011-08-31 (×8): 81 mg via ORAL
  Filled 2011-08-23 (×8): qty 1

## 2011-08-23 MED ORDER — FUROSEMIDE 40 MG PO TABS
40.0000 mg | ORAL_TABLET | Freq: Two times a day (BID) | ORAL | Status: DC
  Administered 2011-08-23 – 2011-08-24 (×2): 40 mg via ORAL
  Filled 2011-08-23 (×3): qty 1

## 2011-08-23 MED ORDER — ASPIRIN 81 MG PO CHEW: 324.0000 mg | CHEWABLE_TABLET | ORAL | Status: AC

## 2011-08-23 MED ORDER — ACETAMINOPHEN 325 MG PO TABS: 650.0000 mg | ORAL_TABLET | ORAL | Status: DC | PRN

## 2011-08-23 MED ORDER — ASPIRIN 300 MG RE SUPP
300.0000 mg | RECTAL | Status: AC
  Filled 2011-08-23: qty 1

## 2011-08-23 MED ORDER — BENAZEPRIL HCL 20 MG PO TABS
20.0000 mg | ORAL_TABLET | Freq: Every day | ORAL | Status: DC
  Administered 2011-08-24 – 2011-08-31 (×7): 20 mg via ORAL
  Filled 2011-08-23 (×8): qty 1

## 2011-08-23 MED ORDER — LINAGLIPTIN 5 MG PO TABS
5.0000 mg | ORAL_TABLET | Freq: Every day | ORAL | Status: DC
  Administered 2011-08-24 – 2011-08-31 (×8): 5 mg via ORAL
  Filled 2011-08-23 (×8): qty 1

## 2011-08-23 MED ORDER — DIAZEPAM 5 MG PO TABS: 5.0000 mg | ORAL_TABLET | ORAL | Status: AC

## 2011-08-23 MED ORDER — FLUTICASONE PROPIONATE 50 MCG/ACT NA SUSP
1.0000 | NASAL | Status: DC | PRN
  Filled 2011-08-23: qty 16

## 2011-08-23 MED ORDER — HEPARIN BOLUS VIA INFUSION
4000.0000 [IU] | Freq: Once | INTRAVENOUS | Status: AC
  Administered 2011-08-23: 4000 [IU] via INTRAVENOUS

## 2011-08-23 MED ORDER — ATORVASTATIN CALCIUM 40 MG PO TABS
40.0000 mg | ORAL_TABLET | Freq: Every day | ORAL | Status: DC
Start: 2011-08-23 — End: 2011-08-31
  Administered 2011-08-23 – 2011-08-30 (×8): 40 mg via ORAL
  Filled 2011-08-23 (×9): qty 1

## 2011-08-23 MED ORDER — ASPIRIN 81 MG PO CHEW
324.0000 mg | CHEWABLE_TABLET | ORAL | Status: AC
  Administered 2011-08-23: 324 mg via ORAL
  Filled 2011-08-23: qty 4

## 2011-08-23 MED ORDER — SODIUM CHLORIDE 0.9 % IJ SOLN: 3.0000 mL | INTRAMUSCULAR | Status: DC | PRN

## 2011-08-23 MED ORDER — TRIAZOLAM 0.125 MG PO TABS
0.1250 mg | ORAL_TABLET | Freq: Every evening | ORAL | Status: DC | PRN
  Administered 2011-08-23 – 2011-08-30 (×8): 0.125 mg via ORAL
  Filled 2011-08-23 (×8): qty 1

## 2011-08-23 MED ORDER — DOFETILIDE 500 MCG PO CAPS
500.0000 ug | ORAL_CAPSULE | Freq: Two times a day (BID) | ORAL | Status: DC
  Administered 2011-08-23 – 2011-08-25 (×5): 500 ug via ORAL
  Filled 2011-08-23 (×8): qty 1

## 2011-08-23 MED ORDER — GUAIFENESIN-DM 100-10 MG/5ML PO SYRP: 15.0000 mL | ORAL_SOLUTION | ORAL | Status: DC | PRN

## 2011-08-23 MED ORDER — ALUM & MAG HYDROXIDE-SIMETH 200-200-20 MG/5ML PO SUSP: 15.0000 mL | ORAL | Status: DC | PRN

## 2011-08-23 MED ORDER — METFORMIN HCL 500 MG PO TABS: 500.0000 mg | ORAL_TABLET | Freq: Every evening | ORAL | Status: DC

## 2011-08-23 MED ORDER — ASPIRIN 81 MG PO TABS: 81.0000 mg | ORAL_TABLET | Freq: Every day | ORAL | Status: DC

## 2011-08-23 MED ORDER — POTASSIUM CHLORIDE CRYS ER 20 MEQ PO TBCR
20.0000 meq | EXTENDED_RELEASE_TABLET | Freq: Every day | ORAL | Status: DC
  Administered 2011-08-23 – 2011-08-31 (×9): 20 meq via ORAL
  Filled 2011-08-23 (×9): qty 1

## 2011-08-23 MED ORDER — LOPERAMIDE HCL 2 MG PO CAPS: 4.0000 mg | ORAL_CAPSULE | Freq: Once | ORAL | Status: AC | PRN

## 2011-08-23 MED ORDER — SODIUM CHLORIDE 0.9 % IV SOLN
250.0000 mL | INTRAVENOUS | Status: DC | PRN
  Administered 2011-08-30: 13:00:00 via INTRAVENOUS

## 2011-08-23 MED ORDER — MAGNESIUM HYDROXIDE 400 MG/5ML PO SUSP: 30.0000 mL | Freq: Every day | ORAL | Status: DC | PRN

## 2011-08-23 MED ORDER — METOPROLOL TARTRATE 25 MG PO TABS
25.0000 mg | ORAL_TABLET | Freq: Two times a day (BID) | ORAL | Status: DC
  Administered 2011-08-23 – 2011-08-31 (×14): 25 mg via ORAL
  Filled 2011-08-23 (×17): qty 1

## 2011-08-23 MED ORDER — INSULIN ASPART 100 UNIT/ML ~~LOC~~ SOLN
0.0000 [IU] | Freq: Three times a day (TID) | SUBCUTANEOUS | Status: DC
  Administered 2011-08-24: 1 [IU] via SUBCUTANEOUS
  Administered 2011-08-24: 2 [IU] via SUBCUTANEOUS
  Administered 2011-08-25 – 2011-08-26 (×3): 1 [IU] via SUBCUTANEOUS
  Administered 2011-08-27: 2 [IU] via SUBCUTANEOUS
  Administered 2011-08-28 (×2): 1 [IU] via SUBCUTANEOUS
  Administered 2011-08-29: 2 [IU] via SUBCUTANEOUS
  Administered 2011-08-29 – 2011-08-31 (×3): 1 [IU] via SUBCUTANEOUS

## 2011-08-23 MED ORDER — ISOSORBIDE MONONITRATE ER 60 MG PO TB24
60.0000 mg | ORAL_TABLET | Freq: Every day | ORAL | Status: DC
  Administered 2011-08-24 – 2011-08-31 (×7): 60 mg via ORAL
  Filled 2011-08-23 (×8): qty 1

## 2011-08-23 MED ORDER — HYDROCORTISONE 1 % EX CREA
1.0000 "application " | TOPICAL_CREAM | Freq: Three times a day (TID) | CUTANEOUS | Status: DC | PRN
  Filled 2011-08-23: qty 28

## 2011-08-23 MED ORDER — HEPARIN (PORCINE) IN NACL 100-0.45 UNIT/ML-% IJ SOLN
1250.0000 [IU]/h | INTRAMUSCULAR | Status: DC
  Administered 2011-08-23: 1000 [IU]/h via INTRAVENOUS
  Administered 2011-08-24: 1250 [IU]/h via INTRAVENOUS
  Filled 2011-08-23 (×4): qty 250

## 2011-08-23 MED ORDER — ONDANSETRON HCL 4 MG/2ML IJ SOLN: 4.0000 mg | Freq: Four times a day (QID) | INTRAMUSCULAR | Status: DC | PRN

## 2011-08-23 MED ORDER — CLOPIDOGREL BISULFATE 75 MG PO TABS
75.0000 mg | ORAL_TABLET | Freq: Every day | ORAL | Status: DC
  Administered 2011-08-24 – 2011-08-27 (×4): 75 mg via ORAL
  Filled 2011-08-23 (×5): qty 1

## 2011-08-23 MED ORDER — SODIUM CHLORIDE 0.9 % IJ SOLN
3.0000 mL | Freq: Two times a day (BID) | INTRAMUSCULAR | Status: DC
  Administered 2011-08-25 – 2011-08-30 (×8): 3 mL via INTRAVENOUS

## 2011-08-23 NOTE — ED Notes (Addendum)
Patient states he was just discharged Sunday,  He is having return of chest pain and jaw pain.  Patient reports he is also short of breath.  He is seen by Dr [name].  Patient states he has hx of cad with blockage,  Patient took 81mg  of aspirin

## 2011-08-23 NOTE — H&P (Signed)
History and Physical   Patient ID: Corey [name] MRN: [id number], DOB/AGE: 03-25-1935   Admit date: 08/23/2011 Date of Consult: 08/23/2011   Primary Physician: Park Pope, MD Primary Cardiologist: Lewayne Bunting, MD (EP); Bonnee Quin, MD   Pt. Profile: [name] is a 76yo male with PMHx significant for CAD (s/p multiple cardiac catheterizations, multiple stent placements, CABG x 4 with re-do and MV repair on 06/2004), ischemic cardiomyopathy (EF 35-40%), history of atrial fibrillation and VT (s/p BiV ICD implantation 07/2005), history of MR (s/p MV repair 06/2004), type 2 DM, HTN, HL and COPD who [facility] with mouth/throat pain, weakness and cold sweats consistent with prior MI.    Problem List: Past Medical History  Diagnosis Date  . Atrial fibrillation     s/p ablation, on tikosyn  . CAD, ARTERY BYPASS GRAFT     4v CABG w/ redo '06; cath 08/14/11 high grade lesion in OM graft, total occlusion of SVG to RCA with collaterals,  patent  LIMA to LAD,occlusion of SVG to Diagonal, and severe segmental LV systolic dysfunction, EF 20%   . CARDIOMYOPATHY, ISCHEMIC   . Chronic systolic heart failure   . DIABETES MELLITUS     type 2  . Dysuria   . HYPERLIPIDEMIA-MIXED   . RENAL INSUFFICIENCY   . ICD (implantable cardiac defibrillator) in place   . Mitral regurgitation      s/p MV repair '06  . HTN (hypertension)   . COPD (chronic obstructive pulmonary disease)     Past Surgical History  Procedure Date  . Coronary artery bypass graft   . Redo cabg   . Mitral valve repair     with an angioplasty ring  . Inguinal hernia repair   . Cervical discectomy     C3-C4  . Cardiac defibrillator placement     Guidant     Allergies:  Allergies  Allergen Reactions  . Lorazepam Other (See Comments)    "became unresponsive"  . Codeine     "made skin crawl"    HPI:   He was admitted to the hospital last week with a subendocardial myocardial infarction.  Cardiac catheterization revealed severe native coronary artery disease. He has moderate to severe disease of the saphenous vein graft to the acute marginal system. An attempt was made to treat him medically but he had severe episodes of recurrent chest pain with daily activities. The his angina pain is up into his jaws and into the roof of his mouth. It is associated with severe shortness of breath. He denies any nausea vomiting, syncope or presyncope. The patient is now admitted for further stabilization and for PCI attempt for tomorrow.  His cardiac enzymes were elevated and the pain gradually trending down. The thought was to let him try medical therapy for a week or so but he says that he could not stand it any longer.  He's also been discovered to have congestive heart failure with an ejection fraction of around 25%. This is thought to be due to an ischemic cardiomyopathy.   Home Medications: Prior to Admission medications   Medication Sig Start Date End Date Taking? Authorizing Provider  aspirin 81 MG tablet Take 81 mg by mouth daily.    Yes Historical Provider, MD  benazepril (LOTENSIN) 20 MG tablet Take 1 tablet (20 mg total) by mouth daily. 05/01/11  Yes Herby Abraham, MD  dofetilide (TIKOSYN) 500 MCG capsule Take 1 capsule (500 mcg total) by mouth 2 (two)  times daily. 09/06/10  Yes Herby Abraham, MD  fluticasone (FLONASE) 50 MCG/ACT nasal spray Place 1 spray into the nose as needed.  03/21/11  Yes Historical Provider, MD  furosemide (LASIX) 40 MG tablet Take 1 tablet (40 mg total) by mouth 2 (two) times daily. 05/01/11  Yes Herby Abraham, MD  glimepiride (AMARYL) 4 MG tablet Take 8 mg by mouth daily before breakfast.   Yes Historical Provider, MD  isosorbide dinitrate (ISORDIL) 30 MG tablet Take 30 mg by mouth daily. Taking 1/2 tablet in the am and a 1/2 in the pm   Yes Historical Provider, MD  metFORMIN (GLUCOPHAGE) 500 MG tablet Take 500 mg by mouth every evening.    Yes Historical  Provider, MD  metoprolol (LOPRESSOR) 50 MG tablet Take 25 mg by mouth 2 (two) times daily. 1/2 tab po bid   Yes Historical Provider, MD  potassium chloride SA (K-DUR,KLOR-CON) 20 MEQ tablet Take 20 mEq by mouth daily. Taking 1 1/2 tablets daily   Yes Historical Provider, MD  saxagliptin HCl (ONGLYZA) 2.5 MG TABS tablet Take 5 mg by mouth every evening.    Yes Historical Provider, MD  triazolam (HALCION) 0.25 MG tablet Take 0.25 mg by mouth at bedtime as needed. For sleep   Yes Historical Provider, MD    Inpatient Medications:     . heparin  4,000 Units Intravenous Once    (Not in a hospital admission)  Family History  Problem Relation Age of Onset  . Hypertension Other      History   Social History  . Marital Status: Widowed    Spouse Name: N/A    Number of Children: N/A  . Years of Education: N/A   Occupational History  . Not on file.   Social History Main Topics  . Smoking status: Former Smoker -- 1.5 packs/day for 40 years    Types: Cigarettes, Pipe    Quit date: 03/06/1985  . Smokeless tobacco: Not on file  . Alcohol Use: No  . Drug Use: No  . Sexually Active: No   Other Topics Concern  . Not on file   Social History Narrative  . No narrative on file     Review of Systems: General: positive for jaw pain, left arm pain, diaphoresis, negative for chills, fever, or weight changes.  Cardiovascular: positive for shortness of breath, palpitations, PND, lightheadedness, negative for chest pain, edema, orthopnea, palpitations, paroxysmal nocturnal dyspnea  Dermatological: negative for rash Respiratory: positive for cough and wheezing Urologic: negative for hematuria Abdominal: negative for nausea, vomiting, diarrhea, bright red blood per rectum, melena, or hematemesis Neurologic:  negative for visual changes, syncope, or dizziness All other systems reviewed and are otherwise negative except as noted above.  Physical Exam: Blood pressure 118/62, pulse 70,  temperature 98.1 F (36.7 C), temperature source Oral, resp. rate 20, height 5\' 9"  (1.753 m), weight 196 lb (88.905 kg), SpO2 95.00%.    General: Well developed, well nourished, in no acute distress. Head: Normocephalic, atraumatic, sclera non-icteric, no xanthomas, nares are without discharge. Neck: Negative for carotid bruits. No JVD Lungs: Clear bilaterally to auscultation without wheezes, rales, or rhonchi.  Heart: RRR with S1 S2. No murmurs, rubs, or gallops  Abdomen: Soft, non-tender, non-distended with normoactive bowel sounds. No hepatomegaly. No rebound/guarding. No obvious abdominal masses. Msk:  Strength and tone appears normal for age. Extremities: No clubbing, cyanosis or edema.  Distal pedal pulses are 2+ and equal bilaterally. Neuro: Alert and oriented X 3. Moves  all extremities spontaneously. Psych:  Responds to questions appropriately with a normal affect.  Labs: Recent Labs  Basename 08/23/11 1401   WBC 5.9   HGB 12.4*   HCT 37.4*   MCV 93.3   PLT 158    Lab 08/23/11 1401 08/19/11 1030 08/18/11 0500  NA 137 137 139  K 4.3 4.2 4.2  CL 101 98 101  CO2 26 27 27   BUN 29* 29* 31*  CREATININE 1.38* 1.32 1.37*  CALCIUM 8.7 9.6 9.3  PROT -- -- --  BILITOT -- -- --  ALKPHOS -- -- --  ALT -- -- --  AST -- -- --  AMYLASE -- -- --  LIPASE -- -- --  GLUCOSE 182* 196* 137*   No results found for this basename: CKTOTAL:3,CKMB:3,CKMBINDEX:3,TROPONINI:3 in the last 72 hours Radiology/Studies: Dg Chest 2 View  08/14/2011  *RADIOLOGY REPORT*  Clinical Data: Shortness of breath, pacemaker battery dead.  CHEST - 2 VIEW  Comparison: 07/22/2008  Findings: Lungs are essentially clear. No pleural effusion or pneumothorax.  Stable cardiomegaly. Postsurgical changes related to prior CABG.  Left subclavian ICD.  Degenerative changes of the visualized thoracolumbar spine. Cervical spine fixation hardware, incompletely visualized.  IMPRESSION: No evidence of acute cardiopulmonary  disease.  Stable cardiomegaly.  Original Report Authenticated By: Charline Bills, M.D.   EKG: AV paced, 70 bpm  ASSESSMENT AND PLAN:   1. CAD/NSTEMI- patient with jaw pain radiating to left arm.  His symptoms are very similar to his presenting symptoms last week. He is a jaw pain and mouth pain are his typical angina.  The pain was so severe he today and yesterday that he thought he was going to die. He does not think he hit he can wait any longer. He would like to try PCI soon. I think we will need to proceed with cath / PCI tomorrow.  - Admit to telemetry  - Cycle cardiac biomarkers  - Pre-cath orders in  - Heparin started as above  - Continue ASA, ACEi, BB, nitrate   - Will add statin  - Heart healthy diet  - Daily BMET/CBC/serial EKGs  We will consider adding Ranexa to his medical regimen. This may help with his diffuse coronary artery disease.    2. Ischemic cardiomyopathy- his ejection fraction is around 25%.  3. History of atrial fibrillation/flutter- s/p ablation. Maintained on Tikosyn.   - Will restart Tikosyn post-cath per home schedule  4. Type 2 DM-stable for now.   5. Hypertension  - Continue above antihypertensives  6. Hyperlipidemia  - Will get lipid panel  - Add statin  7. COPD- stable  - Continue to monitor for wheezing or dyspnea   Alvia Grove., MD, [facility] 08/23/2011, 4:27 PM Office - [phone] Pager [phone]

## 2011-08-23 NOTE — ED Provider Notes (Signed)
History     CSN: [id number]  Arrival date & time 08/23/11  1349   First MD Initiated Contact with Patient 08/23/11 1527      Chief Complaint  Patient presents with  . Chest Pain    (Consider location/radiation/quality/duration/timing/severity/associated sxs/prior treatment) Patient is a 76 y.o. male presenting with chest pain. The history is provided by the patient.  Chest Pain Primary symptoms include shortness of breath. Pertinent negatives for primary symptoms include no abdominal pain, no nausea and no vomiting.  Pertinent negatives for associated symptoms include no numbness and no weakness.    patient's had chest pain shortness of breath since Monday. He was just discharged from the ER on Sunday after a non-STEMI. PA cath which showed some chronic disease and needed more intervention down the road. He states he's been unable to do things that he could do previously. He states that he now will get short of breath and had chest tightness going up to his mouth with exertion. It is his typical angina. He states he has increasing feeling of fluid on his lungs. He states that laying back makes it trouble breathing worse. No fevers. He states he has had a few times where he felt like he was going to die.  Past Medical History  Diagnosis Date  . Atrial fibrillation     s/p ablation, on tikosyn  . CAD, ARTERY BYPASS GRAFT     4v CABG w/ redo '06; cath 08/14/11 high grade lesion in OM graft, total occlusion of SVG to RCA with collaterals,  patent  LIMA to LAD,occlusion of SVG to Diagonal, and severe segmental LV systolic dysfunction, EF 20%   . CARDIOMYOPATHY, ISCHEMIC   . Chronic systolic heart failure   . DIABETES MELLITUS     type 2  . Dysuria   . HYPERLIPIDEMIA-MIXED   . RENAL INSUFFICIENCY   . ICD (implantable cardiac defibrillator) in place   . Mitral regurgitation      s/p MV repair '06  . HTN (hypertension)   . COPD (chronic obstructive pulmonary disease)     Past  Surgical History  Procedure Date  . Coronary artery bypass graft   . Redo cabg   . Mitral valve repair     with an angioplasty ring  . Inguinal hernia repair   . Cervical discectomy     C3-C4  . Cardiac defibrillator placement     Guidant    Family History  Problem Relation Age of Onset  . Hypertension Other     History  Substance Use Topics  . Smoking status: Former Smoker -- 1.5 packs/day for 40 years    Types: Cigarettes, Pipe    Quit date: 03/06/1985  . Smokeless tobacco: Not on file  . Alcohol Use: No      Review of Systems  Constitutional: Negative for activity change and appetite change.  HENT: Negative for neck stiffness.   Eyes: Negative for pain.  Respiratory: Positive for shortness of breath. Negative for chest tightness.   Cardiovascular: Positive for chest pain. Negative for leg swelling.  Gastrointestinal: Negative for nausea, vomiting, abdominal pain and diarrhea.  Genitourinary: Negative for flank pain.  Musculoskeletal: Negative for back pain.  Skin: Negative for rash.  Neurological: Negative for weakness, numbness and headaches.  Psychiatric/Behavioral: Negative for behavioral problems.    Allergies  Lorazepam and Codeine  Home Medications   Current Outpatient Rx  Name Route Sig Dispense Refill  . ASPIRIN 81 MG PO TABS Oral Take 81 mg  by mouth daily.     . ATORVASTATIN CALCIUM 40 MG PO TABS Oral Take 1 tablet (40 mg total) by mouth at bedtime. 30 tablet 2  . BENAZEPRIL HCL 20 MG PO TABS Oral Take 1 tablet (20 mg total) by mouth daily. 90 tablet 3  . CLOPIDOGREL BISULFATE 75 MG PO TABS Oral Take 1 tablet (75 mg total) by mouth daily with breakfast. 30 tablet 6  . DOFETILIDE 500 MCG PO CAPS Oral Take 1 capsule (500 mcg total) by mouth 2 (two) times daily. 180 capsule 3  . FUROSEMIDE 40 MG PO TABS Oral Take 1 tablet (40 mg total) by mouth 2 (two) times daily. 180 tablet 3  . GLIMEPIRIDE 4 MG PO TABS Oral Take 8 mg by mouth daily before breakfast.     . ISOSORBIDE MONONITRATE ER 60 MG PO TB24 Oral Take 1 tablet (60 mg total) by mouth daily. 30 tablet 6  . METFORMIN HCL 500 MG PO TABS Oral Take 500 mg by mouth every evening.     Marland Kitchen METOPROLOL TARTRATE 50 MG PO TABS Oral Take 25 mg by mouth 2 (two) times daily. 1/2 tab po bid    . POTASSIUM CHLORIDE CRYS ER 20 MEQ PO TBCR Oral Take 20 mEq by mouth daily.     Marland Kitchen TRIAZOLAM 0.25 MG PO TABS Oral Take 0.125 mg by mouth at bedtime as needed. For sleep    . FLUTICASONE PROPIONATE 50 MCG/ACT NA SUSP Nasal Place 1 spray into the nose as needed. For stuffy nose and sinuses    . NITROGLYCERIN 0.4 MG SL SUBL Sublingual Place 1 tablet (0.4 mg total) under the tongue every 5 (five) minutes as needed for chest pain (up to 3 doses). 25 tablet 3  . SAXAGLIPTIN HCL 2.5 MG PO TABS Oral Take 2.5 mg by mouth every evening.       BP 118/62  Pulse 70  Temp 98.1 F (36.7 C) (Oral)  Resp 20  Ht 5\' 9"  (1.753 m)  Wt 196 lb (88.905 kg)  BMI 28.94 kg/m2  SpO2 95%  Physical Exam  Nursing note and vitals reviewed. Constitutional: He is oriented to person, place, and time. He appears well-developed and well-nourished.  HENT:  Head: Normocephalic and atraumatic.  Eyes: EOM are normal. Pupils are equal, round, and reactive to light.  Neck: Normal range of motion. Neck supple.  Cardiovascular: Normal rate, regular rhythm and normal heart sounds.   No murmur heard. Pulmonary/Chest: Effort normal. He has rales.       Mild rails to bilateral base.  Abdominal: Soft. Bowel sounds are normal. He exhibits no distension and no mass. There is no tenderness. There is no rebound and no guarding.  Musculoskeletal: Normal range of motion. He exhibits no edema.  Neurological: He is alert and oriented to person, place, and time. No cranial nerve deficit.  Skin: Skin is warm and dry.  Psychiatric: He has a normal mood and affect.    ED Course  Procedures (including critical care time)  Labs Reviewed  CBC - Abnormal;  Notable for the following:    RBC 4.01 (*)     Hemoglobin 12.4 (*)     HCT 37.4 (*)     All other components within normal limits  BASIC METABOLIC PANEL - Abnormal; Notable for the following:    Glucose, Bld 182 (*)     BUN 29 (*)     Creatinine, Ser 1.38 (*)     GFR calc non Af [place] Dago  49 (*)     GFR calc Af Amer 57 (*)     All other components within normal limits  POCT I-STAT TROPONIN I - Abnormal; Notable for the following:    Troponin i, poc 0.13 (*)     All other components within normal limits   Chest Portable 1 View  08/23/2011  *RADIOLOGY REPORT*  Clinical Data: Shortness of breath.  The chest pain.  History of congestive heart failure.  PORTABLE CHEST - 1 VIEW  Comparison: Two-view chest 08/17/2011 and 08/14/2011.  Findings: Cardiomegaly is stable.  The mild pulmonary vascular congestion has increased slightly.  Pacing wires are stable.  The visualized soft tissues and bony thorax are unremarkable.  IMPRESSION: Cardiomegaly with a slight increase in a diffuse interstitial pattern compatible with mild edema or pulmonary vascular congestion.  Original Report Authenticated By: Jamesetta Orleans. MATTERN, M.D.     1. Unstable angina      Date: 08/23/2011  Rate: 70  Rhythm: electronically paced  QRS Axis: normal  Intervals: electronically paced  ST/T Wave abnormalities: electronically paced  Conduction Disutrbances:electronically paced  Narrative Interpretation:   Old EKG Reviewed: unchanged    MDM  Patient recently admitted hospital for non-STEMI. Discharged on Sunday. After that he continued to do worse at home. He is chest pain with minimal exertion. He is able to do less than he used to. The plan at the time of discharge wasn't interval intervention from the saphenous vein graft to the OM. Patient will be admitted to hospital.        Juliet Rude. Rubin Payor, MD 08/23/11 1559

## 2011-08-23 NOTE — ED Notes (Signed)
[phone] joyce friend  [phone] gene son

## 2011-08-23 NOTE — ED Notes (Addendum)
Pt was seen and examined by Dr. Elease Hashimoto. Pt denies any chest discomfort at present.

## 2011-08-23 NOTE — Progress Notes (Signed)
ANTICOAGULATION CONSULT NOTE - Initial Consult  Pharmacy Consult for heparin Indication: chest pain/ACS  Allergies  Allergen Reactions  . Lorazepam Other (See Comments)    "became unresponsive"  . Codeine     "made skin crawl"    Patient Measurements: Height: 5\' 9"  (175.3 cm) Weight: 196 lb (88.905 kg) IBW/kg (Calculated) : 70.7  Heparin Dosing Weight: 88.9 kg  Vital Signs: Temp: 98.1 F (36.7 C) (06/19 1356) Temp src: Oral (06/19 1356) BP: 106/65 mmHg (06/19 1631) Pulse Rate: 70  (06/19 1631)  Labs:  Basename 08/23/11 1401  HGB 12.4*  HCT 37.4*  PLT 158  APTT --  LABPROT --  INR --  HEPARINUNFRC --  CREATININE 1.38*  CKTOTAL --  CKMB --  TROPONINI --    Estimated Creatinine Clearance: 52.6 ml/min (by C-G formula based on Cr of 1.38).   Medical History: Past Medical History  Diagnosis Date  . Atrial fibrillation     s/p ablation, on tikosyn  . CAD, ARTERY BYPASS GRAFT     4v CABG w/ redo '06; cath 08/14/11 high grade lesion in OM graft, total occlusion of SVG to RCA with collaterals,  patent  LIMA to LAD,occlusion of SVG to Diagonal, and severe segmental LV systolic dysfunction, EF 20%   . CARDIOMYOPATHY, ISCHEMIC   . Chronic systolic heart failure   . DIABETES MELLITUS     type 2  . Dysuria   . HYPERLIPIDEMIA-MIXED   . RENAL INSUFFICIENCY   . ICD (implantable cardiac defibrillator) in place   . Mitral regurgitation      s/p MV repair '06  . HTN (hypertension)   . COPD (chronic obstructive pulmonary disease)     Medications:   (Not in a hospital admission)  Assessment: 76 yo M with CP similar to last week when he was admitted for a NSTEMI.  Plan is for cath/ PCI in am.  Patient received heparin 4000 units IV x 1 at 1727 and heparin drip was starts at 1727 at a rate of 1000 units/hr.  Goal of Therapy:  Heparin level 0.3-0.7 units/ml Monitor platelets by anticoagulation protocol: Yes   Plan:  1.  Increase heparin to 1250 units/hr now 2.   Check 8h heparin level and CBC 3.  Daily heparin level and CBC, 1st due 6/21 4.  F/u cath results  Jill Side L. Illene Bolus, PharmD, BCPS Clinical Pharmacist Pager: [phone] 08/23/2011 5:51 PM

## 2011-08-23 NOTE — Telephone Encounter (Signed)
Patient states since he was D/C from the hospital, he has been having pain in the roth of his mouth and throat it got worse every day. Today pain score is "10" , patient is also so  SOB that when he walk  to his bedroom, he can't hardly breath. Patient states the he know it is his heart that is causing the pain. He would like to know what Dr. [name] thinks, if he wants to see him in the office or go to the ER.

## 2011-08-23 NOTE — Progress Notes (Signed)
Patient seen and case discussed.  I doubt there is any reason to do cath tomorrow as planned.  His SVG to the RCA is now occluded and there is more disease in the SVG to the OM.  The LIMA is patent.  There is severe LV dysfunction.  The patient has a prior mitral repair.    I think the best case scenario would be first to have him seen by Dr.Gerhardt.  Importantly, there is little from the interventional standpoint to offer in terms of symptom relief.  The OM graft is there, worrisome for more intermediate term closure, but not causing symptoms.  I think his RCA is collateralized and likely participating in both his LV function abnormality and symptoms due to ischemia.  A third redo would have risk, but may be his only considerable option.    Will ask EG to see him tomorrow.

## 2011-08-23 NOTE — ED Notes (Signed)
Report given to Sam RN

## 2011-08-23 NOTE — Telephone Encounter (Signed)
Patient would like return call from nurse, he can be reached at [phone]  Patient c/o of being SOB ever since hospital discharge on 08/20/11   WARM XFER TO NIVIDA

## 2011-08-23 NOTE — Progress Notes (Signed)
Called by nurse for questions from patient about the timing of his heart cath-- Corey [name] said he did not realize it was planned for tomorrow. However upon further discussion he does recall Dr. Elease Hashimoto mentioning that it might happen tomorrow. The patient is A+Ox3 and is still willing to proceed but I told him I will check with MD in the AM to finalize plans for cath. The patient verbalized understanding and gratitude.  Corey Lesser PA-C

## 2011-08-23 NOTE — Telephone Encounter (Signed)
I spoke with the pt and made him aware that Dr [name] would like him to go back to the ER for evaluation and treatment.  The pt states that he is very SOB and cannot get up and move around his house without having difficulty catching his breath. The pt will have his son take him back to the ER.

## 2011-08-24 ENCOUNTER — Encounter (HOSPITAL_COMMUNITY): Payer: Self-pay | Admitting: General Practice

## 2011-08-24 ENCOUNTER — Telehealth: Payer: Self-pay | Admitting: Cardiology

## 2011-08-24 ENCOUNTER — Encounter (HOSPITAL_COMMUNITY)
Admission: EM | Disposition: A | Discharge status: Home / Self Care | Payer: Self-pay | Source: Home / Self Care | Attending: Cardiology

## 2011-08-24 DIAGNOSIS — I200 Unstable angina: Secondary | ICD-10-CM

## 2011-08-24 DIAGNOSIS — I208 Other forms of angina pectoris: Secondary | ICD-10-CM | POA: Diagnosis present

## 2011-08-24 DIAGNOSIS — Z95810 Presence of automatic (implantable) cardiac defibrillator: Secondary | ICD-10-CM

## 2011-08-24 HISTORY — DX: Presence of automatic (implantable) cardiac defibrillator: Z95.810

## 2011-08-24 LAB — CBC
Hemoglobin: 13 g/dL (ref 13.0–17.0)
MCHC: 34.3 g/dL (ref 30.0–36.0)
RBC: 4.06 MIL/uL — ABNORMAL LOW (ref 4.22–5.81)

## 2011-08-24 LAB — HEPARIN LEVEL (UNFRACTIONATED): Heparin Unfractionated: 0.41 IU/mL (ref 0.30–0.70)

## 2011-08-24 LAB — GLUCOSE, CAPILLARY: Glucose-Capillary: 140 mg/dL — ABNORMAL HIGH (ref 70–99)

## 2011-08-24 LAB — CARDIAC PANEL(CRET KIN+CKTOT+MB+TROPI)
CK, MB: 1.9 ng/mL (ref 0.3–4.0)
CK, MB: 1.9 ng/mL (ref 0.3–4.0)
Relative Index: INVALID (ref 0.0–2.5)
Troponin I: <0.3 ng/mL (ref <0.30)

## 2011-08-24 LAB — TSH: TSH: 4.235 u[IU]/mL (ref 0.350–4.500)

## 2011-08-24 SURGERY — LEFT HEART CATHETERIZATION WITH CORONARY/GRAFT ANGIOGRAM
Anesthesia: LOCAL

## 2011-08-24 MED ORDER — FUROSEMIDE 10 MG/ML IJ SOLN
40.0000 mg | Freq: Two times a day (BID) | INTRAMUSCULAR | Status: DC
  Administered 2011-08-24 – 2011-08-28 (×8): 40 mg via INTRAVENOUS
  Filled 2011-08-24 (×11): qty 4

## 2011-08-24 NOTE — Care Management Note (Signed)
    Page 1 of 1   08/31/2011     11:58:45 AM   CARE MANAGEMENT NOTE 08/31/2011  Patient:  ABDURAHMAN, RUGG   Account Number:  [id number]  Date Initiated:  08/24/2011  Documentation initiated by:  SIMMONS,Zell Hylton  Subjective/Objective Assessment:   ADMITTED WITH CHEST PAIN; LIVES AT HOME ALONE IN [place]; WAS IPTA; USES [facility] IN [place] FOR RX; HAS MEDICARE AND BCBS.     Action/Plan:   DISCHARGE PLANNING DISCUSSED WITH PT AT BEDSIDE.   Anticipated DC Date:  08/31/2011   Anticipated DC Plan:  HOME/SELF CARE      DC Planning Services  CM consult      Choice offered to / List presented to:             Status of service:  Completed, signed off Medicare Important Message given?   (If response is "NO", the following Medicare IM given date fields will be blank) Date Medicare IM given:   Date Additional Medicare IM given:    Discharge Disposition:  HOME/SELF CARE  Per UR Regulation:  Reviewed for med. necessity/level of care/duration of stay  If discussed at Long Length of Stay Meetings, dates discussed:    Comments:  08/31/11  1155  Treveon Bourcier SIMMONS RN, BSN [phone] GENERIC LOVENOX PRICE $52; PER PHARMACIST, PT WILL NEED IT FOR 5 DAYS;  DISCUSSED PRICING WITH PT- AGREEABLE; PT READY FOR DISCHARGE.   08/29/11  1125  Joi Leyva SIMMONS RN, BSN [phone] FOR TEE/ DCCV TOMORROW 08/30/11;  NCM WILL FOLLOW.  08/24/11  1113  Brieanna Nau SIMMONS RN, BSN [phone] PT CONCERNED THAT HE WILL NOT BE ABLE TO AFFORD MEDS POST D/C; NCM WILL FOLLOW.

## 2011-08-24 NOTE — Telephone Encounter (Signed)
Dr. Gerald Dexter Case coordinator called because pt was admitted to [facility] hospital on status of under Observation and he needs to be change  As an inpatient.MD would like to know if Dr. [name] agrees to change pt's status. Trish trent inpatient coordinator  Aware , she will let the PA know  to change pt's status. Dr. Gerald Dexter aware.

## 2011-08-24 NOTE — Telephone Encounter (Signed)
New problem:  Per Dr. Gerald Dexter calling regarding patient hospital admission.

## 2011-08-24 NOTE — Progress Notes (Signed)
Patient Name: Corey [name] Date of Encounter: 08/24/2011   Principal Problem:  *Anginal chest pain at rest Active Problems:  DIABETES MELLITUS  HYPERLIPIDEMIA-MIXED  CAD, ARTERY BYPASS GRAFT  CARDIOMYOPATHY, ISCHEMIC  Chronic systolic heart failure  SUBJECTIVE: No chest pain. Had to get up twice in the night for SOB. It improves and he goes back to sleep. He also gets SOB with activity. Feels fluid in the back of his throat and chest.  OBJECTIVE Filed Vitals:   08/23/11 1631 08/23/11 1715 08/23/11 2027 08/24/11 0659  BP: 106/65 118/76 117/73 114/74  Pulse: 70 70 69 70  Temp:  97.6 F (36.4 C) 98.5 F (36.9 C)   TempSrc:  Oral Oral Oral  Resp: 15 17 18 18   Height:  5\' 9"  (1.753 m)    Weight:  198 lb 6.6 oz (90 kg)    SpO2: 100% 96%  95%   No intake or output data in the 24 hours ending 08/24/11 0819 Weight change:  Filed Weights   08/23/11 1356 08/23/11 1715  Weight: 196 lb (88.905 kg) 198 lb 6.6 oz (90 kg)     PHYSICAL EXAM General: Well developed, well nourished, male in no acute distress. Head: Normocephalic, atraumatic.  Neck: Supple without bruits, JVD minimally elevated. Lungs:  Resp regular and unlabored, a few rales in the bases. Heart: RRR, S1, S2, pos s4. , or murmur. Abdomen: Soft, non-tender, non-distended, BS + x 4.  Extremities: No clubbing, cyanosis, no edema.  Neuro: Alert and oriented X 3. Moves all extremities spontaneously. Psych: Normal affect.  LABS: CBC: Basename 08/24/11 0207 08/23/11 1810  WBC 7.8 6.4  NEUTROABS -- 3.8  HGB 13.0 12.7*  HCT 37.9* 37.4*  MCV 93.3 92.8  PLT 151 167   INR: Basename 08/23/11 1810  INR 1.24   Basic Metabolic Panel: Basename 08/23/11 1810 08/23/11 1401  NA 138 137  K 4.1 4.3  CL 104 101  CO2 26 26  GLUCOSE 74 182*  BUN 28* 29*  CREATININE 1.28 1.38*  CALCIUM 9.1 8.7  MG 2.4 --  PHOS -- --   Liver Function Tests: [facility] 08/23/11 1810  AST 46*  ALT 77*  ALKPHOS 58  BILITOT 0.4  PROT  6.6  ALBUMIN 3.6   Cardiac Enzymes: Basename 08/24/11 0535 08/23/11 2333 08/23/11 1810  CKTOTAL 89 103 129  CKMB 1.9 1.9 2.0  CKMBINDEX -- -- --  TROPONINI <0.30 <0.30 <0.30    Basename 08/23/11 1434  TROPIPOC 0.13*   BNP: Pro B Natriuretic peptide (BNP)  Date/Time Value Range Status  08/23/2011  6:10 PM 2047.0* 0 - 125 pg/mL Final  08/14/2011 12:15 PM 2902.0* 0 - 125 pg/mL Final   Thyroid Function Tests: Basename 08/23/11 1810  TSH 4.235  T4TOTAL --  T3FREE --  THYROIDAB --   TELE: paced  Radiology/Studies: Chest Portable 1 View 08/23/2011  *RADIOLOGY REPORT*  Clinical Data: Shortness of breath.  The chest pain.  History of congestive heart failure.  PORTABLE CHEST - 1 VIEW  Comparison: Two-view chest 08/17/2011 and 08/14/2011.  Findings: Cardiomegaly is stable.  The mild pulmonary vascular congestion has increased slightly.  Pacing wires are stable.  The visualized soft tissues and bony thorax are unremarkable.  IMPRESSION: Cardiomegaly with a slight increase in a diffuse interstitial pattern compatible with mild edema or pulmonary vascular congestion.  Original Report Authenticated By: Jamesetta Orleans. MATTERN, M.D.    Current Medications:    . aspirin  324 mg Oral NOW   Or  .  aspirin  300 mg Rectal NOW  . aspirin  324 mg Oral Pre-Cath  . aspirin EC  81 mg Oral Daily  . atorvastatin  40 mg Oral QHS  . benazepril  20 mg Oral Daily  . clopidogrel  75 mg Oral Q breakfast  . diazepam  5 mg Oral On Call  . dofetilide  500 mcg Oral BID  . furosemide  40 mg Oral BID  . glimepiride  8 mg Oral QAC breakfast  . heparin  4,000 Units Intravenous Once  . insulin aspart  0-9 Units Subcutaneous TID WC  . isosorbide mononitrate  60 mg Oral Daily  . linagliptin  5 mg Oral Daily  . metoprolol  25 mg Oral BID  . potassium chloride SA  20 mEq Oral Daily  . sodium chloride  3 mL Intravenous Q12H  . DISCONTD: aspirin  81 mg Oral Daily  . DISCONTD: metFORMIN  500 mg Oral QPM      .  heparin 1,250 Units/hr (08/23/11 1812)    ASSESSMENT AND PLAN: Principal Problem:  *Anginal chest pain at rest - SOB felt to be anginal equivalent. Symptoms at rest and with exertion. On Imdur 60, consider increase to 90 mg or add Ranexa for better symptom control. TCTS to see for consideration of re-do CABG. He will consider it.  Active Problems:  DIABETES MELLITUS - metformin held, otherwise on home Rx plus SSI.  Otherwise, follow I/O, weights and continue home meds for:  HYPERLIPIDEMIA-MIXED  CAD, ARTERY BYPASS GRAFT  CARDIOMYOPATHY, ISCHEMIC/ Chronic systolic heart failure  Signed, Theodore Demark , PA-C 8:19 AM 08/24/2011   Patient seen and examined earlier.  He has typical angina for him.  I suspect that the RCA graft occlusion is the source for this.  PCI of the OM is not likely to help at all.  I suspect he has viable myocardium in the inferior segment.  A redo for the third time would carry considerable risk.  We could adjust meds, but our options are somewhat limited.   I have asked Dr. Tyrone Sage to review and he will see the patient during this admission.    Change furosemide to IV for one to two days to remove residual fluid.

## 2011-08-24 NOTE — Consult Note (Signed)
[address]            [place] [address]          [phone]       Corey [name] [facility] Medical Record #[id number] Date of Birth: [birth year]  Referring: No ref. provider found Primary Care: Park Pope, MD  Chief Complaint:    Chief Complaint  Patient presents with  . Chest Pain    History of Present Illness:     The patient is a 76 year old male with a long and complicated history of coronary artery disease and multiple medical issues, including atrial fibrillation status post ablation, chronic systolic CHF, and ischemic cardiomyopathy with biventricular ICD in place (pt states the battery is dead and he is scheduled to have it changed 7/3) .  He has undergone previous CABG in the 1990s, with redo CABG x  4 and mitral valve repair by Dr. Tyrone Sage in 2006.  He has also had multiple stents placements.  He was admitted 1 week ago to [facility] with a NSTEMI.  He underwent catheterization at that time, which revealed a high grade lesion in the the vein graft to the OM, total occlusion of the SVG to the RCA with collaterals, and severe LV systolic dysfunction, EF 20%.  He was medically managed and discharged home with plans for a possible PCI to the OM in the future.  On 08/23/2011, the patient developed exertional chest tightness radiating to his mouth and jaw, with associated dyspnea and orthopnea.  His symptoms were identical to his previous MI symptoms, and he presented to the ER for further evaluation and treatment.   He was admitted by [facility], and continued on maximum medical therapy for his anginal symptoms.  His recent catheterization films were reviewed, and it was felt that there was little from an interventional standpoint that could be done to offer symptomatic relief.  A cardiac surgery consult was requested to evaluate the possibility of a third time redo bypass.  Since his hospitalization, the patient has continued to have  shortness of breath, worse with exertion, and orthopnea.    Past Medical History:   Past Medical History  Diagnosis Date  . Atrial fibrillation     s/p ablation, on tikosyn  . CAD, ARTERY BYPASS GRAFT     4v CABG w/ redo '06; cath 08/14/11 high grade lesion in OM graft, total occlusion of SVG to RCA with collaterals,  patent  LIMA to LAD,occlusion of SVG to Diagonal, and severe segmental LV systolic dysfunction, EF 20%   . CARDIOMYOPATHY, ISCHEMIC   . Chronic systolic heart failure   . Dysuria   . HYPERLIPIDEMIA-MIXED   . RENAL INSUFFICIENCY   . Mitral regurgitation      s/p MV repair '06  . HTN (hypertension)   . COPD (chronic obstructive pulmonary disease)   . Complication of anesthesia     "liked to went crazy w/the mess they gave me; started w/an  A"  . CHF (congestive heart failure)   . Myocardial infarction 1987; ~ 08/17/11  . Anginal pain   . Pacemaker 09/17/2011    "battery is dead; it doesn't work"  . ICD (implantable cardiac defibrillator) in place 09/17/11    "battery is dead; it doesn't work"  . Pneumonia     h/o "walking pneumonia; long time ago"  . Shortness of breath 09/17/11    "  all the time"  . Type II diabetes mellitus   . Arthritis     "in the neck after neck OR"   Past Surgical History: Past Surgical History  Procedure Date  . Mitral valve repair     with an angioplasty ring  . Cervical discectomy     C3-C4  . Cardiac defibrillator placement ~ 2007    [employer]ant  . Coronary artery bypass graft 1995    CABG X 2  . Coronary artery bypass graft 2002    CABG "& fixed hole in back of the heart"  . Coronary angioplasty with stent placement     "2 I think"  . Inguinal hernia repair     bilaterally  . Excisional hemorrhoidectomy   . Anterior fusion cervical spine     "went down in my throat to do it"   Social History: History  Smoking status  . Former Smoker -- 1.5 packs/day for 40 years  . Types: Cigarettes, Pipe  . Quit date: 03/06/1985  Smokeless  tobacco  . Former Neurosurgeon  . Types: Snuff, Chew  . Quit date: 03/06/1958  Comment: "quit chewing and dipping in my early 20's"    History  Alcohol Use  . Yes    08/24/11 "last alcohol 15 years ago or better"    History   Social History  . Marital Status: Widowed    Spouse Name: N/A    Number of Children: N/A  . Years of Education: N/A   Occupational History  . Not on file.   Social History Main Topics  . Smoking status: Former Smoker -- 1.5 packs/day for 40 years    Types: Cigarettes, Pipe    Quit date: 03/06/1985  . Smokeless tobacco: Former Neurosurgeon    Types: Snuff, Dorna Bloom    Quit date: 03/06/1958   Comment: "quit chewing and dipping in my early 20's"  . Alcohol Use: Yes     08/24/11 "last alcohol 15 years ago or better"  . Drug Use: No     Allergies: Allergies  Allergen Reactions  . Lorazepam Other (See Comments)    "became unresponsive"  . Codeine Other (See Comments)    "made skin crawl"    Medications: Current Facility-Administered Medications  Medication Dose Route Frequency Provider Last Rate Last Dose  . 0.9 %  sodium chloride infusion  250 mL Intravenous PRN Vesta Mixer, MD      . acetaminophen (TYLENOL) tablet 650 mg  650 mg Oral Q4H PRN Vesta Mixer, MD      . alum & mag hydroxide-simeth (MAALOX/MYLANTA) 200-200-20 MG/5ML suspension 15-30 mL  15-30 mL Oral Q2H PRN Vesta Mixer, MD      . aspirin chewable tablet 324 mg  324 mg Oral NOW Vesta Mixer, MD   324 mg at 08/23/11 1840   Or  . aspirin suppository 300 mg  300 mg Rectal NOW Vesta Mixer, MD      . aspirin chewable tablet 324 mg  324 mg Oral Pre-Cath Herby Abraham, MD      . aspirin EC tablet 81 mg  81 mg Oral Daily Vesta Mixer, MD      . atorvastatin (LIPITOR) tablet 40 mg  40 mg Oral QHS Vesta Mixer, MD   40 mg at 08/23/11 2242  . benazepril (LOTENSIN) tablet 20 mg  20 mg Oral Daily Vesta Mixer, MD      . clopidogrel (PLAVIX) tablet 75 mg  75 mg  Oral Q breakfast Vesta Mixer, MD   75 mg at 08/24/11 0851  . diazepam (VALIUM) tablet 5 mg  5 mg Oral On Call Herby Abraham, MD      . dofetilide [facility]) capsule 500 mcg  500 mcg Oral BID Vesta Mixer, MD   500 mcg at 08/23/11 2242  . fluticasone (FLONASE) 50 MCG/ACT nasal spray 1 spray  1 spray Each Nare PRN Vesta Mixer, MD      . furosemide (LASIX) tablet 40 mg  40 mg Oral BID Vesta Mixer, MD   40 mg at 08/23/11 2243  . glimepiride (AMARYL) tablet 8 mg  8 mg Oral QAC breakfast Vesta Mixer, MD   8 mg at 08/24/11 1610  . guaiFENesin-dextromethorphan (ROBITUSSIN DM) 100-10 MG/5ML syrup 15 mL  15 mL Oral Q4H PRN Vesta Mixer, MD      . heparin ADULT infusion 100 units/mL (25000 units/250 mL)  1,250 Units/hr Intravenous Continuous Drusilla Kanner, PHARMD 12.5 mL/hr at 08/24/11 0853 1,250 Units/hr at 08/24/11 0853  . heparin bolus via infusion 4,000 Units  4,000 Units Intravenous Once Harrold Donath R. Rubin Payor, MD   4,000 Units at 08/23/11 1727  . hydrocortisone cream 1 % 1 application  1 application Topical TID PRN Vesta Mixer, MD      . insulin aspart (novoLOG) injection 0-9 Units  0-9 Units Subcutaneous TID WC Laurann Montana, PA   1 Units at 08/24/11 [phone]  . isosorbide mononitrate (IMDUR) 24 hr tablet 60 mg  60 mg Oral Daily Vesta Mixer, MD      . linagliptin (TRADJENTA) tablet 5 mg  5 mg Oral Daily Vesta Mixer, MD      . loperamide (IMODIUM) capsule 2 mg  2 mg Oral PRN Vesta Mixer, MD      . loperamide (IMODIUM) capsule 4 mg  4 mg Oral Once PRN Vesta Mixer, MD      . magnesium hydroxide (MILK OF MAGNESIA) suspension 30 mL  30 mL Oral Daily PRN Vesta Mixer, MD      . metoprolol tartrate (LOPRESSOR) tablet 25 mg  25 mg Oral BID Vesta Mixer, MD   25 mg at 08/23/11 2244  . nitroGLYCERIN (NITROSTAT) SL tablet 0.4 mg  0.4 mg Sublingual Q5 min PRN Vesta Mixer, MD      . nitroGLYCERIN (NITROSTAT) SL tablet 0.4 mg  0.4 mg Sublingual Q5 Min x 3 PRN Vesta Mixer, MD      .  ondansetron [facility]) injection 4 mg  4 mg Intravenous Q6H PRN Vesta Mixer, MD      . potassium chloride SA (K-DUR,KLOR-CON) CR tablet 20 mEq  20 mEq Oral Daily Vesta Mixer, MD   20 mEq at 08/23/11 2244  . pramoxine-mineral oil-zinc (TUCKS) rectal ointment 1 application  1 application Rectal TID PRN Vesta Mixer, MD      . sodium chloride 0.9 % injection 3 mL  3 mL Intravenous Q12H Vesta Mixer, MD      . sodium chloride 0.9 % injection 3 mL  3 mL Intravenous PRN Vesta Mixer, MD      . triazolam (HALCION) tablet 0.125 mg  0.125 mg Oral QHS PRN Vesta Mixer, MD   0.125 mg at 08/23/11 2309  . DISCONTD: aspirin tablet 81 mg  81 mg Oral Daily Vesta Mixer, MD      . DISCONTD: metFORMIN (GLUCOPHAGE) tablet 500  mg  500 mg Oral QPM Vesta Mixer, MD        Prescriptions prior to admission  Medication Sig Dispense Refill  . aspirin 81 MG tablet Take 81 mg by mouth daily.       Marland Kitchen atorvastatin (LIPITOR) 40 MG tablet Take 1 tablet (40 mg total) by mouth at bedtime.  30 tablet  2  . benazepril (LOTENSIN) 20 MG tablet Take 1 tablet (20 mg total) by mouth daily.  90 tablet  3  . clopidogrel (PLAVIX) 75 MG tablet Take 1 tablet (75 mg total) by mouth daily with breakfast.  30 tablet  6  . dofetilide (TIKOSYN) 500 MCG capsule Take 1 capsule (500 mcg total) by mouth 2 (two) times daily.  180 capsule  3  . furosemide (LASIX) 40 MG tablet Take 1 tablet (40 mg total) by mouth 2 (two) times daily.  180 tablet  3  . glimepiride (AMARYL) 4 MG tablet Take 8 mg by mouth daily before breakfast.      . isosorbide mononitrate (IMDUR) 60 MG 24 hr tablet Take 1 tablet (60 mg total) by mouth daily.  30 tablet  6  . metFORMIN (GLUCOPHAGE) 500 MG tablet Take 500 mg by mouth every evening.       . metoprolol (LOPRESSOR) 50 MG tablet Take 25 mg by mouth 2 (two) times daily. 1/2 tab po bid      . potassium chloride SA (K-DUR,KLOR-CON) 20 MEQ tablet Take 20 mEq by mouth daily.       . triazolam (HALCION)  0.25 MG tablet Take 0.125 mg by mouth at bedtime as needed. For sleep      . fluticasone (FLONASE) 50 MCG/ACT nasal spray Place 1 spray into the nose as needed. For stuffy nose and sinuses      . nitroGLYCERIN (NITROSTAT) 0.4 MG SL tablet Place 1 tablet (0.4 mg total) under the tongue every 5 (five) minutes as needed for chest pain (up to 3 doses).  25 tablet  3  . saxagliptin HCl (ONGLYZA) 2.5 MG TABS tablet Take 2.5 mg by mouth every evening.        Family History: Family History  Problem Relation Age of Onset   CAD 2 brothers   . Hypertension Other      Review of Systems:     Cardiac Review of Systems: Y or N  Chest Pain [   Y ]  Resting SOB [   Y] Exertional SOB  [ Y ]              Orthopnea [ Y ]   Pedal Edema [  N ]    Palpitations [ Y ] Syncope  [ N ]               Presyncope [   N]  General Review of Systems: [Y] = yes [  ]=no Constitional: recent weight change [  ]; anorexia [  ]; fatigue [  y]; nausea [  ]; night sweats [  ]; fever [  ]; or chills [  ];  Dental: poor dentition[  ];   Eye : blurred vision [  ]; diplopia [   ]; vision changes [  ];                         Amaurosis fugax[  ]; Resp: cough [  y];  wheezing[  ];  hemoptysis[  ]; shortness of breath[ y ]; paroxysmal nocturnal dyspnea[  ]; dyspnea on exertion[y  ]; or orthopnea[ y ];  GI:  gallstones[  ], vomiting[  ];  dysphagia[  ]; melena[  ];  hematochezia [  ]; heartburn[  ];   Hx of  Colonoscopy[  ];   GU: kidney stones [  ]; hematuria[  ];   dysuria [  ];  nocturia[  ];  history of  obstruction [  ];             Skin: rash, swelling[  ];, hair loss[  ];  peripheral edema[  ];  or                     itching[  ]; Musculosketetal: myalgias[  ];  joint swelling[  ];  joint erythema[  ];  joint pain[  ];  back pain[  ];  Heme/Lymph: bruising[  y];  bleeding[  ];  anemia[  ];  Neuro: TIA[   ];  headaches[  ];  stroke[  ];  vertigo[  ];  seizures[  ];   paresthesias[  ];  difficulty walking[  ];  Psych:depression[  ]; anxiety[  ];  Endocrine: diabetes[ y ];  thyroid dysfunction[  ];   Immunizations: Flu [  ]; Pneumococcal[  ];  Other: States he feels "bloated" and has been having lower abdominal discomfort recently, some lightheadedness   Physical Exam: BP 114/74  Pulse 70  Temp 98.5 F (36.9 C) (Oral)  Resp 18  Ht 5\' 9"  (1.753 m)  Wt 198 lb 6.6 oz (90 kg)  BMI 29.30 kg/m2  SpO2 95%  General appearance: alert and no distress HEENT: Normocephalic, atraumatic, no external abnormalities Neck: supple, no lymphadenopathy, thyromegaly or bruits Heart: regular rate and rhythm, no murmurs, rubs or gallops.  Sternotomy scar is well healed. Lungs: Clear to auscultation Abdomen: Soft, non-tender, non-distended with normoactive bowel sounds. Extremities: No clubbing, cyanosis, or edema. The right leg has saphenectomy scars from ankle to mid thigh which are well-healed.  The left leg has a healed EVH scar at the knee. Distal pulses 1+.   Diagnostic Studies & Laboratory data:     Recent Radiology Findings:   Chest Portable 1 View  08/23/2011  *RADIOLOGY REPORT*  Clinical Data: Shortness of breath.  The chest pain.  History of congestive heart failure.  PORTABLE CHEST - 1 VIEW  Comparison: Two-view chest 08/17/2011 and 08/14/2011.  Findings: Cardiomegaly is stable.  The mild pulmonary vascular congestion has increased slightly.  Pacing wires are stable.  The visualized soft tissues and bony thorax are unremarkable.  IMPRESSION: Cardiomegaly with a slight increase in a diffuse interstitial pattern compatible with mild edema or pulmonary vascular congestion.  Original Report Authenticated By: Jamesetta Orleans. MATTERN, M.D.      Recent Lab Findings: Lab Results  Component Value Date   WBC 7.8 08/24/2011   HGB 13.0 08/24/2011   HCT 37.9* 08/24/2011   PLT 151 08/24/2011   GLUCOSE 74  08/23/2011   CHOL 141 08/15/2011   TRIG 148 08/15/2011   HDL 26* 08/15/2011  LDLCALC 85 08/15/2011   ALT 77* 08/23/2011   AST 46* 08/23/2011   NA 138 08/23/2011   K 4.1 08/23/2011   CL 104 08/23/2011   CREATININE 1.28 08/23/2011   BUN 28* 08/23/2011   CO2 26 08/23/2011   TSH 4.235 08/23/2011   INR 1.24 08/23/2011   HGBA1C 8.1* 08/14/2011   Cardiac Cath: Procedure: Left Heart Cath, Selective Coronary Angiography, SVG angio, LIMA angio, LV angiography  Indication: NSTEMI. 76 year old gentleman with extensive CAD and 2 previous bypass surgeries. He has also undergone mitral valve repair and cardiac resynchronization therapy. He presents with acute onset of chest and throat tightness approximately 72 hours ago. His initial cardiac markers were elevated on point-of-care testing and his troponin was further elevated on subsequent testing. He was brought urgently for cardiac catheterization.  Procedural details: The right groin was prepped, draped, and anesthetized with 1% lidocaine. Using modified Seldinger technique, a 5 French sheath was introduced into the right femoral artery. Standard Judkins catheters were used for coronary angiography, saphenous vein graft angiography, LIMA angiography, and left ventriculography. Catheter exchanges were performed over a guidewire. There were no immediate procedural complications. The patient was transferred to the post catheterization recovery area for further monitoring.  Procedural Findings:  Hemodynamics:  AO 101/63 with a mean of 78  LV 101/25  Coronary angiography:  Coronary dominance: right  Left mainstem: The left main is patent with moderate calcification. The vessel is diffusely diseased with 50% stenosis present. It divides into the LAD and left circumflex.  Left anterior descending (LAD): The LAD is severely diseased at the first septal perforator. The vessel goes on to occlude in the midportion.  Left circumflex (LCx): The circumflex is fairly small in  caliber. The vessel has severe diffuse disease with total occlusion in the midportion of the AV groove. There is an intermediate branch with moderately severe diffuse stenosis.  Right coronary artery (RCA): The RCA is totally occluded. There are ridging collaterals supplying the distal RCA with a well formed collateral to the posterolateral branch retrograde filling the mid and distal RCA and the RV marginal branch. There are also left to right collaterals supplying the distal RCA circulation.  LIMA to LAD: The vessel remains patent. The LAD Is small and diffusely diseased beyond the LIMA insertion site  Saphenous vein graft to distal RCA: The vein graft is heavily stented. It is totally occluded in the proximal aspect.  Saphenous vein graft to diagonal: Chronically occluded and unchanged from previous study.  Saphenous vein graft sequence to OM1 and OM 2: The vein graft is patent throughout. The distal anastomotic sites are intact. There is a focal in lesion in the distal body of the graft prior to the insertion of the first OM. This lesion appears 50% stenotic in some views and up to 75% in other views. The distal OM branches supply left to right collaterals to the distal RCA territory.  Left ventriculography: There is very severe left ventricular segmental systolic dysfunction. The entire apex is akinetic. The basal anterior wall and basal inferior wall are hypokinetic. The estimated left ventricular ejection fraction is 20%.  Final Conclusions:  1. Severe native multivessel coronary artery disease  2. Status post CABG with continued patency of the LIMA to LAD and continued patency of the saphenous vein graft sequence to OM1 and OM 2.  3. Total occlusion of the saphenous vein graft to right coronary artery  4. Chronic occlusion of the saphenous vein graft to diagonal  5. Severe  segmental LV systolic dysfunction  Recommendations: Recommend medical therapy and serial enzymes to help quantify the extent  of this patient's acute myocardial infarction. He clearly has developed severe left ventricular dysfunction. Consideration will be given to staged intervention of the saphenous vein graft to obtuse marginal branches. The lesion appears borderline and I will review it with Dr. [name]. Will start the patient on Plavix for further medical treatment of his non-ST elevation infarction.  [name]  08/14/2011, 4:16 PM  ECHO: 08/16/2011 Transthoracic Echocardiography  Patient: Corey [name], Corey [name] MR #: [id number] Study Date: 08/16/2011 Gender: M Age: 40 Height: 175.3cm Weight: 89.8kg BSA: 2.45m^2 Pt. Status: Room: 2915  Elmo Putt, MD ADMITTING Willa Rough, MD, [facility] PERFORMING Corinda Gubler, [facility] SONOGRAPHER Georgian Co, RDCS, CCT cc:  ------------------------------------------------------------ LV EF: 25%  ------------------------------------------------------------ Indications: MI - follow-up 410.92.  ------------------------------------------------------------ History: PMH: Atrial fibrillation. Coronary artery disease. Risk factors: Ischemic Cardiomyopathy. NSTEMI. ICD in place. Renal insufficiency. Hypertension. Diabetes mellitus. Dyslipidemia.  ------------------------------------------------------------ Study Conclusions  - Left ventricle: Diffuse hypokinesis. septal and apical akinesis The cavity size was severely dilated. Wall thickness was normal. The estimated ejection fraction was 25%. - Mitral valve: S/P repair of mitral valve with trivial residual MR - Left atrium: The atrium was moderately dilated. - Right atrium: The atrium was mildly dilated. - Atrial septum: No defect or patent foramen ovale was identified. - Pulmonary arteries: PA peak pressure: 63mm Hg (S).  ------------------------------------------------------------ Labs, prior tests, procedures, and surgery: Coronary artery bypass grafting.  Transthoracic echocardiography. M-mode,  complete 2D, spectral Doppler, and color Doppler. Height: Height: 175.3cm. Height: 69in. Weight: Weight: 89.8kg. Weight: 197.6lb. Body mass index: BMI: 29.2kg/m^2. Body surface area: BSA: 2.52m^2. Blood pressure: 108/67. Patient status: Inpatient. Location: ICU/CCU  ------------------------------------------------------------  ------------------------------------------------------------ Left ventricle: Diffuse hypokinesis. septal and apical akinesis The cavity size was severely dilated. Wall thickness was normal. The estimated ejection fraction was 25%.  ------------------------------------------------------------ Aortic valve: Mildly calcified leaflets.  ------------------------------------------------------------ Aorta: The aorta was normal, not dilated, and non-diseased.  ------------------------------------------------------------ Mitral valve: S/P repair of mitral valve with trivial residual MR Doppler: Valve area by pressure half-time: 3.79cm^2. Indexed valve area by pressure half-time: 1.84cm^2/m^2. Mean gradient: 4mm Hg (D). Peak gradient: 15mm Hg (D).  ------------------------------------------------------------ Left atrium: The atrium was moderately dilated.  ------------------------------------------------------------ Atrial septum: No defect or patent foramen ovale was identified.  ------------------------------------------------------------ Right ventricle: The cavity size was normal. Wall thickness was normal. Pacer wire or catheter noted in right ventricle. Systolic function was normal.  ------------------------------------------------------------ Pulmonic valve: Doppler: Mild regurgitation.  ------------------------------------------------------------ Tricuspid valve: Doppler: Mild regurgitation.  ------------------------------------------------------------ Right atrium: The atrium was mildly  dilated.  ------------------------------------------------------------ Pericardium: The pericardium was normal in appearance.  ------------------------------------------------------------ Post procedure conclusions Ascending Aorta:  - The aorta was normal, not dilated, and non-diseased.  ------------------------------------------------------------  2D measurements Normal Doppler measurements Norma Left ventricle l LVID ED, 61.4 mm 43-52 Main pulmonary artery chord, Pressure, 63 mm Hg =30 PLAX S LVID ES, 48.6 mm 23-38 Pressure 21 mm Hg ----- chord, ED PLAX Mitral valve FS, chord, 21 % >29 Mean vel, 77. cm/s ----- PLAX D 7 LVPW, ED 9.73 mm ------ Pressure 58 ms ----- IVS/LVPW 1.12 <1.3 half-time ratio, ED Mean 4 mm Hg ----- Vol ED, 172 ml ------ gradient, MOD1 D Vol ES, 133 ml ------ Peak 15 mm Hg ----- MOD1 gradient, EF, MOD1 23 % ------ D Vol index, 83 ml/m^2 ------ Area (PHT) 3.7 cm^2 ----- ED, MOD1 9 Vol index, 65 ml/m^2 ------ Area index 1.8 cm^2/m^2 -----  ES, MOD1 (PHT) 4 Ventricular septum Annulus 40. cm ----- IVS, ED 10.9 mm ------ VTI 3 Aorta Tricuspid valve Root diam, 33 mm ------ Regurg 357 cm/s ----- ED peak vel Left atrium Peak RV-RA 51 mm Hg ----- AP dim 41 mm ------ gradient, AP dim 1.99 cm/m^2 <2.2 S index Max regurg 357 cm/s ----- Vol, S 77 ml ------ vel Vol index, 37.4 ml/m^2 ------ Systemic veins S Estimated 12 mm Hg ----- Right ventricle CVP RVID ED, 27.2 mm 19-38 Right ventricle PLAX Pressure, 63 mm Hg <30 S Sa vel, 5.1 cm/s ----- lat ann, 9 tiss DP Pulmonic valve Regurg 148 cm/s ----- vel, ED  ------------------------------------------------------------ Prepared and Electronically Authenticated by  Charlton Haws 2013-06-12T11:40:27.143   Assessment / Plan:    1, Class iv Heart failure, Graft to right occluded, but remaining rt coronary small. I do not  Recommend Redo CABG to RCA,  unlikly to improve poor lv function and likely  would not survive .   2, aggressive rx of heart failure symptoms with heart failure team    Delight Ovens MD  Beeper [phone] Office [phone] 08/25/2011 9:24 AM

## 2011-08-24 NOTE — Progress Notes (Signed)
ANTICOAGULATION CONSULT NOTE   Pharmacy Consult for heparin Indication: chest pain/ACS  Allergies  Allergen Reactions  . Lorazepam Other (See Comments)    "became unresponsive"  . Codeine Other (See Comments)    "made skin crawl"    Patient Measurements: Height: 5\' 9"  (175.3 cm) Weight: 198 lb 6.6 oz (90 kg) IBW/kg (Calculated) : 70.7  Heparin Dosing Weight: 88.9 kg  Vital Signs: Temp: 98.5 F (36.9 C) (06/19 2027) Temp src: Oral (06/19 2027) BP: 117/73 mmHg (06/19 2027) Pulse Rate: 69  (06/19 2027)  Labs:  Basename 08/24/11 0207 08/23/11 2333 08/23/11 1810 08/23/11 1401  HGB 13.0 -- 12.7* --  HCT 37.9* -- 37.4* 37.4*  PLT 151 -- 167 158  APTT -- -- 31 --  LABPROT -- -- 15.9* --  INR -- -- 1.24 --  HEPARINUNFRC 0.41 -- -- --  CREATININE -- -- 1.28 1.38*  CKTOTAL -- 103 129 --  CKMB -- 1.9 2.0 --  TROPONINI -- <0.30 <0.30 --    Estimated Creatinine Clearance: 57 ml/min (by C-G formula based on Cr of 1.28).  Assessment: 76 yo M with chest pain for Heparin  Goal of Therapy:  Heparin level 0.3-0.7 units/ml Monitor platelets by anticoagulation protocol: Yes   Plan:  Continue Heparin at current rate  F/U plan  Geannie Risen, PharmD, BCPS  08/24/2011 3:08 AM

## 2011-08-25 ENCOUNTER — Observation Stay (HOSPITAL_COMMUNITY): Payer: Medicare Other

## 2011-08-25 DIAGNOSIS — I2510 Atherosclerotic heart disease of native coronary artery without angina pectoris: Secondary | ICD-10-CM

## 2011-08-25 DIAGNOSIS — I200 Unstable angina: Secondary | ICD-10-CM

## 2011-08-25 LAB — GLUCOSE, CAPILLARY
Glucose-Capillary: 132 mg/dL — ABNORMAL HIGH (ref 70–99)
Glucose-Capillary: 120 mg/dL — ABNORMAL HIGH (ref 70–99)
Glucose-Capillary: 116 mg/dL — ABNORMAL HIGH (ref 70–99)

## 2011-08-25 LAB — CBC
HCT: 38.8 % — ABNORMAL LOW (ref 39.0–52.0)
Hemoglobin: 13.3 g/dL (ref 13.0–17.0)
MCHC: 34.3 g/dL (ref 30.0–36.0)
RBC: 4.18 MIL/uL — ABNORMAL LOW (ref 4.22–5.81)
WBC: 6.5 10*3/uL (ref 4.0–10.5)

## 2011-08-25 LAB — BASIC METABOLIC PANEL
Chloride: 103 mEq/L (ref 96–112)
GFR calc non Af Amer: 56 mL/min — ABNORMAL LOW (ref >90)
Glucose, Bld: 129 mg/dL — ABNORMAL HIGH (ref 70–99)
Potassium: 4.1 mEq/L (ref 3.5–5.1)
Sodium: 139 mEq/L (ref 135–145)

## 2011-08-25 LAB — HEPARIN LEVEL (UNFRACTIONATED): Heparin Unfractionated: 0.43 IU/mL (ref 0.30–0.70)

## 2011-08-25 MED ORDER — ENOXAPARIN SODIUM 40 MG/0.4ML ~~LOC~~ SOLN
40.0000 mg | Freq: Every day | SUBCUTANEOUS | Status: DC
  Administered 2011-08-25 – 2011-08-29 (×5): 40 mg via SUBCUTANEOUS
  Filled 2011-08-25 (×6): qty 0.4

## 2011-08-25 NOTE — Progress Notes (Signed)
ANTICOAGULATION CONSULT NOTE - FOLLOW UP  Pharmacy Consult:  Heparin Indication: chest pain/ACS  Allergies  Allergen Reactions  . Lorazepam Other (See Comments)    "became unresponsive"  . Codeine Other (See Comments)    "made skin crawl"    Patient Measurements: Height: 5\' 9"  (175.3 cm) Weight: 198 lb 6.6 oz (90 kg) IBW/kg (Calculated) : 70.7  Heparin Dosing Weight: 89 kg  Vital Signs: Temp: 98.1 F (36.7 C) (06/21 0620) Temp src: Oral (06/21 0620) BP: 110/71 mmHg (06/21 1012) Pulse Rate: 70  (06/21 1012)  Labs:  Basename 08/25/11 0630 08/24/11 0535 08/24/11 0207 08/23/11 2333 08/23/11 1810 08/23/11 1401  HGB 13.3 -- 13.0 -- -- --  HCT 38.8* -- 37.9* -- 37.4* --  PLT 171 -- 151 -- 167 --  APTT -- -- -- -- 31 --  LABPROT -- -- -- -- 15.9* --  INR -- -- -- -- 1.24 --  HEPARINUNFRC 0.43 -- 0.41 -- -- --  CREATININE 1.24 -- -- -- 1.28 1.38*  CKTOTAL -- 89 -- 103 129 --  CKMB -- 1.9 -- 1.9 2.0 --  TROPONINI -- <0.30 -- <0.30 <0.30 --    Estimated Creatinine Clearance: 58.8 ml/min (by C-G formula based on Cr of 1.24).     Assessment: 75 YOM with CP similar to last week when he was admitted for a NSTEMI.  Cardiothoracic surgery recommends against re-do CABG since patient unlilkely to improve due to poor LV function.  Heparin level therapeutic, no bleeding reported.   Goal of Therapy:  Heparin level 0.3-0.7 units/ml Monitor platelets by anticoagulation protocol: Yes    Plan:  - Continue heparin gtt at 1250 units/hr - Daily HL / CBC - F/U anticoagulation plans since surgery recommends against re-do CABG     Paislynn Hegstrom D. Laney Potash, PharmD, BCPS Pager:  [phone] 08/25/2011, 10:46 AM

## 2011-08-25 NOTE — Progress Notes (Signed)
Subjective:  He seems symptomatically improved.  Less chest discomfort.  Appreciate notes of Dr. Tyrone Sage.  No active pain.    Objective:  Vital Signs in the last 24 hours: Temp:  [97 F (36.1 C)-98.1 F (36.7 C)] 98.1 F (36.7 C) (06/21 0620) Pulse Rate:  [70] 70  (06/21 1012) Resp:  [18] 18  (06/21 0620) BP: (94-118)/(55-73) 110/71 mmHg (06/21 1012) SpO2:  [93 %-96 %] 93 % (06/21 0620)  Intake/Output from previous day: 06/20 0701 - 06/21 0700 In: 900 [P.O.:480; I.V.:420] Out: 2675 [Urine:2675]   Physical Exam: General: Well developed, well nourished, in no acute distress. Head:  Normocephalic and atraumatic. Lungs: Clear to auscultation and percussion. Heart: Normal S1 and S2.  No murmur, rubs or gallops.  Pulses: Pulses normal in all 4 extremities. Extremities: No clubbing or cyanosis. No edema. Neurologic: Alert and oriented x 3.    Lab Results:  Basename 08/25/11 0630 08/24/11 0207  WBC 6.5 7.8  HGB 13.3 13.0  PLT 171 151    Basename 08/25/11 0630 08/23/11 1810  NA 139 138  K 4.1 4.1  CL 103 104  CO2 26 26  GLUCOSE 129* 74  BUN 21 28*  CREATININE 1.24 1.28    Basename 08/24/11 0535 08/23/11 2333  TROPONINI <0.30 <0.30   Hepatic Function Panel  Basename 08/23/11 1810  PROT 6.6  ALBUMIN 3.6  AST 46*  ALT 77*  ALKPHOS 58  BILITOT 0.4  BILIDIR --  IBILI --   No results found for this basename: CHOL in the last 72 hours No results found for this basename: PROTIME in the last 72 hours  Imaging: Chest Portable 1 View  08/23/2011  *RADIOLOGY REPORT*  Clinical Data: Shortness of breath.  The chest pain.  History of congestive heart failure.  PORTABLE CHEST - 1 VIEW  Comparison: Two-view chest 08/17/2011 and 08/14/2011.  Findings: Cardiomegaly is stable.  The mild pulmonary vascular congestion has increased slightly.  Pacing wires are stable.  The visualized soft tissues and bony thorax are unremarkable.  IMPRESSION: Cardiomegaly with a slight increase  in a diffuse interstitial pattern compatible with mild edema or pulmonary vascular congestion.  Original Report Authenticated By: Jamesetta Orleans. MATTERN, M.D.      Assessment/Plan:  Patient Active Hospital Problem List: Anginal chest pain at rest (08/24/2011)   Assessment: somewhat improved   Plan: continue IV diureses and recheck CXR DIABETES MELLITUS (07/11/2008)   Assessment: glucoses ok   Plan: monitor  CAD, ARTERY BYPASS GRAFT (07/22/2008)   Assessment: not a surgical third redo candidate   Plan: modify meds for now  Chronic systolic heart failure (08/25/2009)   Assessment: xray worse yesterday   Plan: continue IV furosemide through weekend, then PO  Two issues outstanding---from when I saw him last I cannot tell if he is in NSR or afib.  He has previously refused and stopped antithrombin therapy in the past.  I would favor a newer agent if we were to use because of intolerance to the difficulties with warfarin.  I have asked [employer] to interrogate to determine if af or NSR.  He is awaiting a generator change.  We also have the issue of residual narrowing of the SVG to the OM.  We had originally talked about conservative management.  This likely needs to be opened, but the timing is unclear.  It is not impacting his current presentation.          Shawnie Pons, MD, [facility], FSCAI 08/25/2011, 12:27 PM

## 2011-08-26 LAB — BASIC METABOLIC PANEL
BUN: 25 mg/dL — ABNORMAL HIGH (ref 6–23)
Chloride: 103 mEq/L (ref 96–112)
Creatinine, Ser: 1.25 mg/dL (ref 0.50–1.35)
GFR calc non Af Amer: 55 mL/min — ABNORMAL LOW (ref >90)
Glucose, Bld: 112 mg/dL — ABNORMAL HIGH (ref 70–99)
Potassium: 4.2 mEq/L (ref 3.5–5.1)

## 2011-08-26 LAB — GLUCOSE, CAPILLARY
Glucose-Capillary: 121 mg/dL — ABNORMAL HIGH (ref 70–99)
Glucose-Capillary: 165 mg/dL — ABNORMAL HIGH (ref 70–99)

## 2011-08-26 LAB — PROTIME-INR: Prothrombin Time: 15.2 seconds (ref 11.6–15.2)

## 2011-08-26 MED ORDER — WARFARIN - PHYSICIAN DOSING INPATIENT
Freq: Every day | Status: DC
  Administered 2011-08-30: 18:00:00

## 2011-08-26 MED ORDER — WARFARIN SODIUM 5 MG PO TABS
5.0000 mg | ORAL_TABLET | Freq: Every day | ORAL | Status: DC
  Administered 2011-08-26 – 2011-08-30 (×5): 5 mg via ORAL
  Filled 2011-08-26 (×6): qty 1

## 2011-08-26 NOTE — Progress Notes (Signed)
Patient ID: Corey [name], male   DOB: Jan 19, 1936, 76 y.o.   MRN: [id number] Subjective:  No chest pain or sob. Objective:  Vital Signs in the last 24 hours: Temp:  [97.4 F (36.3 C)-98.2 F (36.8 C)] 98.2 F (36.8 C) (06/22 0425) Pulse Rate:  [70] 70  (06/22 0425) Resp:  [18] 18  (06/22 0425) BP: (102-110)/(59-71) 105/69 mmHg (06/22 0425) SpO2:  [93 %-98 %] 93 % (06/22 0425) Weight:  [189 lb 13.1 oz (86.1 kg)] 189 lb 13.1 oz (86.1 kg) (06/22 0425)  Intake/Output from previous day: 06/21 0701 - 06/22 0700 In: 832.7 [P.O.:720; I.V.:112.7] Out: 2825 [Urine:2825] Intake/Output from this shift:    Physical Exam: Well appearing NAD HEENT: Unremarkable Neck:  No JVD, no thyromegally Lungs:  Clear with no wheezes, rales, or rhonchi HEART:  Regular rate rhythm, no murmurs, no rubs, no clicks Abd:  Flat, positive bowel sounds, no organomegally, no rebound, no guarding Ext:  2 plus pulses, no edema, no cyanosis, no clubbing Skin:  No rashes no nodules Neuro:  CN II through XII intact, motor grossly intact  Lab Results:  Basename 08/25/11 0630 08/24/11 0207  WBC 6.5 7.8  HGB 13.3 13.0  PLT 171 151    Basename 08/26/11 0610 08/25/11 0630  NA 140 139  K 4.2 4.1  CL 103 103  CO2 26 26  GLUCOSE 112* 129*  BUN 25* 21  CREATININE 1.25 1.24    Basename 08/24/11 0535 08/23/11 2333  TROPONINI <0.30 <0.30   Hepatic Function Panel  Basename 08/23/11 1810  PROT 6.6  ALBUMIN 3.6  AST 46*  ALT 77*  ALKPHOS 58  BILITOT 0.4  BILIDIR --  IBILI --   No results found for this basename: CHOL in the last 72 hours No results found for this basename: PROTIME in the last 72 hours  Imaging: Dg Chest 2 View  08/25/2011  *RADIOLOGY REPORT*  Clinical Data: Chest pain and shortness of breath.  CHEST - 2 VIEW  Comparison: Chest x-ray 08/23/2011.  Findings: Lungs are well expanded bilaterally, without consolidative airspace disease or pleural effusions.  No pneumothorax.  Pulmonary  vasculature is within normal limits. Heart size is mildly enlarged.  Mediastinal contours are unremarkable.  Atherosclerosis in the thoracic aorta.  The patient is status post median sternotomy for CABG with a LIMA to the LAD. Additionally, there has been a mitral annuloplasty.  A left-sided biventricular pacemaker/AICD is in place with lead tips projecting over the expected location of the right atrium, right ventricle and left ventricle (via the coronary sinus and coronary veins).  IMPRESSION: 1.  No radiographic evidence of acute cardiopulmonary disease. 2.  Mild cardiomegaly is unchanged. 3.  Postoperative changes and support apparatus redemonstrated, as above. 4.  Atherosclerosis.  Original Report Authenticated By: Florencia Reasons, M.D.    Cardiac Studies: Tele - atrial fib with ventricular pacing Assessment/Plan:  1. Atrial fib - the patient is in atrial fib with CHB and BiV pacing 2. ICM with too high risk for redo surgery 3. Class 3 ChF 4. Dyslipidemia Rec: will stop Plavix and start warfarin. He is willing to take. Will plan to do TEE once he is therapeutic. He may ultimately require amiodarone. Historically, he has been unwilling to take his meds but with his worsening clinical course, the patient appears to be more compliant.  LOS: 3 days    Lewayne Bunting 08/26/2011, 8:36 AM

## 2011-08-26 NOTE — Progress Notes (Signed)
08/26/2011 10:02 AM Nursing note EKG performed. QTC noted at 581. PT. V-paced at 70. Called PA per orders. Orders received to d/c tikosyn. Orders enacted. Will continue to monitor. Jamacia Jester, Blanchard Kelch

## 2011-08-27 LAB — GLUCOSE, CAPILLARY
Glucose-Capillary: 105 mg/dL — ABNORMAL HIGH (ref 70–99)
Glucose-Capillary: 173 mg/dL — ABNORMAL HIGH (ref 70–99)
Glucose-Capillary: 145 mg/dL — ABNORMAL HIGH (ref 70–99)

## 2011-08-27 MED ORDER — BENAZEPRIL HCL 10 MG PO TABS
10.0000 mg | ORAL_TABLET | Freq: Once | ORAL | Status: AC
  Administered 2011-08-27: 10 mg via ORAL
  Filled 2011-08-27 (×2): qty 1

## 2011-08-27 MED ORDER — AMIODARONE HCL 200 MG PO TABS
400.0000 mg | ORAL_TABLET | Freq: Two times a day (BID) | ORAL | Status: DC
  Administered 2011-08-27 – 2011-08-31 (×9): 400 mg via ORAL
  Filled 2011-08-27 (×12): qty 2

## 2011-08-27 NOTE — Progress Notes (Signed)
Patient ID: Corey [name], male   DOB: 05-09-1935, 76 y.o.   MRN: [id number] Subjective:  No chest pain or sob. Blood pressure a bit soft.  Objective:  Vital Signs in the last 24 hours: Temp:  [97.4 F (36.3 C)-97.5 F (36.4 C)] 97.5 F (36.4 C) (06/23 0516) Pulse Rate:  [70] 70  (06/23 1016) Resp:  [19-20] 20  (06/23 0516) BP: (96-107)/(54-65) 100/60 mmHg (06/23 1016) SpO2:  [91 %-92 %] 91 % (06/23 0516) Weight:  [188 lb (85.276 kg)] 188 lb (85.276 kg) (06/23 0516)  Intake/Output from previous day: 06/22 0701 - 06/23 0700 In: 240 [P.O.:240] Out: 2025 [Urine:2025] Intake/Output from this shift: Total I/O In: -  Out: 400 [Urine:400]  Physical Exam: stable appearing NAD HEENT: Unremarkable Neck:  7 cm JVD, no thyromegally Lungs:  Clear with basilar rales. HEART:  Regular rate rhythm, no murmurs, no rubs, no clicks Abd:  Flat, positive bowel sounds, no organomegally, no rebound, no guarding Ext:  2 plus pulses, no edema, no cyanosis, no clubbing Skin:  No rashes no nodules Neuro:  CN II through XII intact, motor grossly intact  Lab Results:  Basename 08/25/11 0630  WBC 6.5  HGB 13.3  PLT 171    Basename 08/26/11 0610 08/25/11 0630  NA 140 139  K 4.2 4.1  CL 103 103  CO2 26 26  GLUCOSE 112* 129*  BUN 25* 21  CREATININE 1.25 1.24   No results found for this basename: TROPONINI:2,CK,MB:2 in the last 72 hours Hepatic Function Panel No results found for this basename: PROT,ALBUMIN,AST,ALT,ALKPHOS,BILITOT,BILIDIR,IBILI in the last 72 hours No results found for this basename: CHOL in the last 72 hours No results found for this basename: PROTIME in the last 72 hours  Imaging: Dg Chest 2 View  08/25/2011  *RADIOLOGY REPORT*  Clinical Data: Chest pain and shortness of breath.  CHEST - 2 VIEW  Comparison: Chest x-ray 08/23/2011.  Findings: Lungs are well expanded bilaterally, without consolidative airspace disease or pleural effusions.  No pneumothorax.  Pulmonary  vasculature is within normal limits. Heart size is mildly enlarged.  Mediastinal contours are unremarkable.  Atherosclerosis in the thoracic aorta.  The patient is status post median sternotomy for CABG with a LIMA to the LAD. Additionally, there has been a mitral annuloplasty.  A left-sided biventricular pacemaker/AICD is in place with lead tips projecting over the expected location of the right atrium, right ventricle and left ventricle (via the coronary sinus and coronary veins).  IMPRESSION: 1.  No radiographic evidence of acute cardiopulmonary disease. 2.  Mild cardiomegaly is unchanged. 3.  Postoperative changes and support apparatus redemonstrated, as above. 4.  Atherosclerosis.  Original Report Authenticated By: Florencia Reasons, M.D.    Cardiac Studies: Tele - atrial fib with ventricular pacing Assessment/Plan:  1. Atrial fib 2. ICM 3. Chronic class 3 CHF 4. NSTEMI Rec: Will start amiodarone today, continue coumadin, plan TEE cardioversion on Tues or Wednesday. I plan to push back his ICD generator change for a couple of weeks. He is approaching end stage but hopefully a return to NSR will improve his symptoms.  LOS: 4 days    Buel Ream.D. 08/27/2011, 10:30 AM

## 2011-08-27 NOTE — Progress Notes (Signed)
08/27/2011 10:21 AM Nursing notes Pt. bp noted at 100/60. Dr. [name] paged and made aware. Imdur held per orders. Benazepril dose decreased to 10 mg for one dose only today per orders. Metoprolol given per orders. Will continue to closely monitor patient and blood pressure. Patient updated on plan of care. Pt. Verbalized understanding.  Corey [name], Blanchard Kelch

## 2011-08-27 NOTE — Progress Notes (Signed)
Patient ID: Corey [name], male   DOB: 1935/05/05, 76 y.o.   MRN: [id number] Received call from nurse.  Dr [name] indicated stopping Plavix in his note 6/22.  He also indicated starting amiodarone on his note 6/23. Neither was ordered.  Reviewed chart.  I D/C Plavix.  Left message with Dr [name] to indicate and order dose of amiodarone that he wants.  Charlton Haws 08/27/2011 2:31 PM

## 2011-08-28 DIAGNOSIS — I4891 Unspecified atrial fibrillation: Secondary | ICD-10-CM

## 2011-08-28 DIAGNOSIS — I5022 Chronic systolic (congestive) heart failure: Secondary | ICD-10-CM

## 2011-08-28 LAB — GLUCOSE, CAPILLARY
Glucose-Capillary: 144 mg/dL — ABNORMAL HIGH (ref 70–99)
Glucose-Capillary: 152 mg/dL — ABNORMAL HIGH (ref 70–99)

## 2011-08-28 LAB — BASIC METABOLIC PANEL
BUN: 31 mg/dL — ABNORMAL HIGH (ref 6–23)
Calcium: 9.4 mg/dL (ref 8.4–10.5)
Chloride: 98 mEq/L (ref 96–112)
Creatinine, Ser: 1.34 mg/dL (ref 0.50–1.35)
GFR calc Af Amer: 59 mL/min — ABNORMAL LOW (ref >90)

## 2011-08-28 MED ORDER — FUROSEMIDE 40 MG PO TABS
40.0000 mg | ORAL_TABLET | Freq: Two times a day (BID) | ORAL | Status: DC
  Administered 2011-08-28 – 2011-08-30 (×4): 40 mg via ORAL
  Filled 2011-08-28 (×6): qty 1

## 2011-08-28 NOTE — Progress Notes (Signed)
Patient: Corey [name] Date of Encounter: 08/28/2011, 8:31 AM Admit date: 08/23/2011     Subjective  Mr. Narez has no complaints this AM. He denies CP, SOB, palpitations or dizziness. He feels his orthopnea has improved. He denies any LE or abdominal swelling.   Objective  Physical Exam: Vitals: BP 103/68  Pulse 70  Temp 98.5 F (36.9 C) (Oral)  Resp 19  Ht 5\' 9"  (1.753 m)  Wt 187 lb 1.6 oz (84.868 kg)  BMI 27.63 kg/m2  SpO2 95% General: Well developed, well appearing 76 year old male in no acute distress. Head: Normocephalic, atraumatic, sclera non-icteric, no xanthomas, nares are without discharge.  Neck: Supple. JVD not elevated. Lungs: Clear bilaterally to auscultation without wheezes, rales, or rhonchi. Breathing is unlabored. Heart: RRR S1 S2 without murmurs, rubs, or gallops.  Abdomen: Soft, non-tender, non-distended with normoactive bowel sounds. No hepatomegaly.  Extremities: No clubbing or cyanosis. No edema.  Distal pedal pulses are 2+ and equal bilaterally. Neuro: Alert and oriented X 3. Moves all extremities spontaneously. No focal deficits. Psych:  Responds to questions appropriately with a normal affect.  Intake/Output Summary (Last 24 hours) at 08/28/11 0831 Last data filed at 08/28/11 0532  Gross per 24 hour  Intake    360 ml  Output   2900 ml  Net  -2540 ml   Inpatient Medications:  . amiodarone  400 mg Oral BID  . aspirin EC  81 mg Oral Daily  . atorvastatin  40 mg Oral QHS  . benazepril  10 mg Oral Once  . benazepril  20 mg Oral Daily  . enoxaparin (LOVENOX) injection  40 mg Subcutaneous Daily  . furosemide  40 mg Intravenous BID  . glimepiride  8 mg Oral QAC breakfast  . insulin aspart  0-9 Units Subcutaneous TID WC  . isosorbide mononitrate  60 mg Oral Daily  . linagliptin  5 mg Oral Daily  . metoprolol  25 mg Oral BID  . potassium chloride SA  20 mEq Oral Daily  . warfarin  5 mg Oral q1800  . DISCONTD: clopidogrel  75 mg Oral Q breakfast     Labs:  [facility] 08/26/11 0610  NA 140  K 4.2  CL 103  CO2 26  GLUCOSE 112*  BUN 25*  CREATININE 1.25  CALCIUM 9.0  MG 2.3  PHOS --   INR 08/26/2011 - 1.18  Diagnostics: Echocardiogram: from 08/16/2011 - Left ventricle: Diffuse hypokinesis. septal and apical akinesis The cavity size was severely dilated. Wall thickness was normal. The estimated ejection fraction was 25%. - Mitral valve: S/P repair of mitral valve with trivial residual MR - Left atrium: The atrium was moderately dilated. - Right atrium: The atrium was mildly dilated. - Atrial septum: No defect or patent foramen ovale was identified. - Pulmonary arteries: PA peak pressure: 63mm Hg (S).  Telemetry: V paced with underlying AF   Assessment and Plan  1. Atrial fibrillation - continue amiodarone and metoprolol; continue warfarin for embolic prophylaxis; check INR now and daily in AM; once therapeutic, consider TEE-guided DCCV in hopes of restoring SR to determine if this will improve his symptoms  2. ICM, LVEF 25%, BiV ICD in place 3. Chronic systolic CHF - on IV lasix currently with good diuresis 4. NSTEMI with known severe CAD - per Dr. Tyrone Sage, he is not a surgical candidate; continue medical/conservative management for now; Dr. [name] following  Dr. [name] to see and make further recommendations. Signed, Haskell Rihn PA-C

## 2011-08-28 NOTE — Progress Notes (Signed)
Subjective:  Case discussed with Dr. [name] earlier today.  Patient getting loaded with amiodarone, and is now off Tikosyn.  Also getting warfarin, and patient is agreeable.  Is not currently on lovenox other than sub Q as patient had previously referred an type of anticoagulation.  He stopped warfarin previously.    Objective:  Vital Signs in the last 24 hours: Temp:  [98 F (36.7 C)-98.6 F (37 C)] 98 F (36.7 C) (06/24 1356) Pulse Rate:  [66-70] 66  (06/24 1356) Resp:  [18-19] 18  (06/24 1356) BP: (99-105)/(60-68) 103/65 mmHg (06/24 1356) SpO2:  [95 %] 95 % (06/24 1356) Weight:  [187 lb 1.6 oz (84.868 kg)] 187 lb 1.6 oz (84.868 kg) (06/24 0459)  Intake/Output from previous day: 06/23 0701 - 06/24 0700 In: 600 [P.O.:600] Out: 2900 [Urine:2900]   Physical Exam: General: Well developed, well nourished, in no acute distress. Head:  Normocephalic and atraumatic. Lungs: Clear to auscultation and percussion. Heart: Normal S1 and S2.  No murmur, rubs or gallops.  Pulses: Pulses normal in all 4 extremities. Extremities: No clubbing or cyanosis. No edema. Neurologic: Alert and oriented x 3.    Lab Results: No results found for this basename: WBC:2,HGB:2,PLT:2 in the last 72 hours  Basename 08/28/11 0848 08/26/11 0610  NA 137 140  K 4.2 4.2  CL 98 103  CO2 26 26  GLUCOSE 271* 112*  BUN 31* 25*  CREATININE 1.34 1.25   No results found for this basename: TROPONINI:2,CK,MB:2 in the last 72 hours Hepatic Function Panel No results found for this basename: PROT,ALBUMIN,AST,ALT,ALKPHOS,BILITOT,BILIDIR,IBILI in the last 72 hours No results found for this basename: CHOL in the last 72 hours No results found for this basename: PROTIME in the last 72 hours  Imaging: No results found.    Assessment/Plan:  Patient Active Hospital Problem List: Anginal chest pain at rest (08/24/2011)   Assessment: he is improved.  Beginning to ambulate without difficulty.    Plan: Continue meds.     DIABETES MELLITUS (07/11/2008)   Assessment: monitoring sugars   Plan: see accucheck  CAD, ARTERY BYPASS GRAFT (07/22/2008)   Assessment: Has been turned down for third redo.   Plan: Would continue meds for now.  Would hold off on PCI until atrial fib treated  CARDIOMYOPATHY, ISCHEMIC (07/11/2008)   Assessment: see above.    Plan change furosemide to po.  May dc if doing better with plans for CVersion in about three weeks.          Shawnie Pons, MD, [facility], FSCAI 08/28/2011, 2:36 PM

## 2011-08-29 LAB — GLUCOSE, CAPILLARY
Glucose-Capillary: 144 mg/dL — ABNORMAL HIGH (ref 70–99)
Glucose-Capillary: 119 mg/dL — ABNORMAL HIGH (ref 70–99)
Glucose-Capillary: 201 mg/dL — ABNORMAL HIGH (ref 70–99)

## 2011-08-29 LAB — PROTIME-INR: INR: 1.19 (ref 0.00–1.49)

## 2011-08-29 LAB — BASIC METABOLIC PANEL
CO2: 25 mEq/L (ref 19–32)
Calcium: 8.9 mg/dL (ref 8.4–10.5)
GFR calc non Af Amer: 44 mL/min — ABNORMAL LOW (ref >90)
Potassium: 4.1 mEq/L (ref 3.5–5.1)
Sodium: 136 mEq/L (ref 135–145)

## 2011-08-29 MED ORDER — SODIUM CHLORIDE 0.9 % IJ SOLN: 3.0000 mL | INTRAMUSCULAR | Status: DC | PRN

## 2011-08-29 MED ORDER — SODIUM CHLORIDE 0.9 % IV SOLN
250.0000 mL | INTRAVENOUS | Status: DC
Start: 2011-08-29 — End: 2011-08-29

## 2011-08-29 MED ORDER — SODIUM CHLORIDE 0.9 % IJ SOLN: 3.0000 mL | Freq: Two times a day (BID) | INTRAMUSCULAR | Status: DC

## 2011-08-29 MED ORDER — SODIUM CHLORIDE 0.9 % IV SOLN: 250.0000 mL | INTRAVENOUS | Status: DC | PRN

## 2011-08-29 MED ORDER — SODIUM CHLORIDE 0.45 % IV SOLN: INTRAVENOUS | Status: DC

## 2011-08-29 MED ORDER — SODIUM CHLORIDE 0.9 % IJ SOLN
3.0000 mL | Freq: Two times a day (BID) | INTRAMUSCULAR | Status: DC
  Administered 2011-08-29: 3 mL via INTRAVENOUS

## 2011-08-29 MED ORDER — HYDROCORTISONE 1 % EX CREA: 1.0000 "application " | TOPICAL_CREAM | Freq: Three times a day (TID) | CUTANEOUS | Status: DC | PRN

## 2011-08-29 NOTE — Progress Notes (Signed)
Patient ID: Corey [name], male   DOB: 03/21/1935, 75 y.o.   MRN: [id number] Subjective:  No chest pain. C/o headache,mild  Objective:  Vital Signs in the last 24 hours: Temp:  [97.8 F (36.6 C)-98.3 F (36.8 C)] 98.3 F (36.8 C) (06/25 0640) Pulse Rate:  [66-70] 70  (06/25 0640) Resp:  [18-19] 19  (06/25 0640) BP: (97-104)/(64-72) 97/64 mmHg (06/25 0640) SpO2:  [95 %-96 %] 96 % (06/25 0640) Weight:  [191 lb 5.8 oz (86.8 kg)] 191 lb 5.8 oz (86.8 kg) (06/25 0640)  Intake/Output from previous day: 06/24 0701 - 06/25 0700 In: 1440 [P.O.:1440] Out: 1325 [Urine:1325] Intake/Output from this shift:    Physical Exam: Well appearing NAD HEENT: Unremarkable Neck:  7 cm JVD, no thyromegally Lymphatics:  No adenopathy Back:  No CVA tenderness Lungs:  Clear with no wheezes HEART:  Regular rate rhythm, no murmurs, no rubs, no clicks Abd:  Flat, positive bowel sounds, no organomegally, no rebound, no guarding Ext:  2 plus pulses, no edema, no cyanosis, no clubbing Skin:  No rashes no nodules Neuro:  CN II through XII intact, motor grossly intact  Lab Results: No results found for this basename: WBC:2,HGB:2,PLT:2 in the last 72 hours  Basename 08/29/11 0535 08/28/11 0848  NA 136 137  K 4.1 4.2  CL 100 98  CO2 25 26  GLUCOSE 137* 271*  BUN 36* 31*  CREATININE 1.51* 1.34   No results found for this basename: TROPONINI:2,CK,MB:2 in the last 72 hours Hepatic Function Panel No results found for this basename: PROT,ALBUMIN,AST,ALT,ALKPHOS,BILITOT,BILIDIR,IBILI in the last 72 hours No results found for this basename: CHOL in the last 72 hours No results found for this basename: PROTIME in the last 72 hours  Imaging: No results found.  Cardiac Studies: Tele - atrial fib with Biv pacing Assessment/Plan:  1. Atrial fib - he has been on amiodarone for 24 hours. Hopefully he will tolerate. Will plan DCCV tomorrow. Will give lovenox if INR is not therapeutic. 2. CAD - his anginal  symptoms have improved. He walked the halls yesterday with out pain. 3. Chronic systolic CHF - he appears to be well compensated. Continue current meds.  LOS: 6 days    Corey [name] M.D. 08/29/2011, 8:36 AM     

## 2011-08-30 ENCOUNTER — Encounter (HOSPITAL_COMMUNITY): Payer: Self-pay | Admitting: Anesthesiology

## 2011-08-30 ENCOUNTER — Encounter (HOSPITAL_COMMUNITY)
Admission: EM | Disposition: A | Discharge status: Home / Self Care | Payer: Self-pay | Source: Home / Self Care | Attending: Cardiology

## 2011-08-30 ENCOUNTER — Inpatient Hospital Stay (HOSPITAL_COMMUNITY): Payer: Medicare Other | Admitting: Anesthesiology

## 2011-08-30 ENCOUNTER — Encounter (HOSPITAL_COMMUNITY): Payer: Self-pay | Admitting: Gastroenterology

## 2011-08-30 ENCOUNTER — Encounter: Payer: Medicare Other | Admitting: Physician Assistant

## 2011-08-30 DIAGNOSIS — I5023 Acute on chronic systolic (congestive) heart failure: Secondary | ICD-10-CM

## 2011-08-30 DIAGNOSIS — I4891 Unspecified atrial fibrillation: Secondary | ICD-10-CM

## 2011-08-30 DIAGNOSIS — I214 Non-ST elevation (NSTEMI) myocardial infarction: Secondary | ICD-10-CM

## 2011-08-30 HISTORY — PX: TEE WITHOUT CARDIOVERSION: SHX5443

## 2011-08-30 HISTORY — PX: CARDIOVERSION: SHX1299

## 2011-08-30 LAB — BASIC METABOLIC PANEL
BUN: 36 mg/dL — ABNORMAL HIGH (ref 6–23)
CO2: 26 mEq/L (ref 19–32)
Chloride: 100 mEq/L (ref 96–112)
Creatinine, Ser: 1.57 mg/dL — ABNORMAL HIGH (ref 0.50–1.35)
Potassium: 4.9 mEq/L (ref 3.5–5.1)

## 2011-08-30 LAB — GLUCOSE, CAPILLARY

## 2011-08-30 LAB — PROTIME-INR: Prothrombin Time: 16.2 seconds — ABNORMAL HIGH (ref 11.6–15.2)

## 2011-08-30 SURGERY — ECHOCARDIOGRAM, TRANSESOPHAGEAL
Anesthesia: General

## 2011-08-30 MED ORDER — BUTAMBEN-TETRACAINE-BENZOCAINE 2-2-14 % EX AERO
INHALATION_SPRAY | CUTANEOUS | Status: DC | PRN
  Administered 2011-08-30: 2 via TOPICAL

## 2011-08-30 MED ORDER — SODIUM CHLORIDE 0.9 % IV SOLN: INTRAVENOUS | Status: DC

## 2011-08-30 MED ORDER — ONDANSETRON HCL 4 MG/2ML IJ SOLN: 4.0000 mg | Freq: Four times a day (QID) | INTRAMUSCULAR | Status: DC | PRN

## 2011-08-30 MED ORDER — FENTANYL CITRATE 0.05 MG/ML IJ SOLN
INTRAMUSCULAR | Status: AC
  Filled 2011-08-30: qty 4

## 2011-08-30 MED ORDER — MIDAZOLAM HCL 10 MG/2ML IJ SOLN
INTRAMUSCULAR | Status: AC
  Filled 2011-08-30: qty 4

## 2011-08-30 MED ORDER — DIPHENHYDRAMINE HCL 50 MG/ML IJ SOLN
INTRAMUSCULAR | Status: AC
  Filled 2011-08-30: qty 1

## 2011-08-30 MED ORDER — ACETAMINOPHEN 325 MG PO TABS: 650.0000 mg | ORAL_TABLET | ORAL | Status: DC | PRN

## 2011-08-30 MED ORDER — LIDOCAINE VISCOUS 2 % MT SOLN
OROMUCOSAL | Status: AC
  Filled 2011-08-30: qty 15

## 2011-08-30 MED ORDER — PROPOFOL 10 MG/ML IV EMUL
INTRAVENOUS | Status: DC | PRN
  Administered 2011-08-30: 70 mg via INTRAVENOUS

## 2011-08-30 MED ORDER — SODIUM CHLORIDE 0.9 % IV SOLN
INTRAVENOUS | Status: DC
  Administered 2011-08-30: 500 mL via INTRAVENOUS

## 2011-08-30 MED ORDER — FUROSEMIDE 40 MG PO TABS
40.0000 mg | ORAL_TABLET | Freq: Every day | ORAL | Status: DC
  Administered 2011-08-31: 40 mg via ORAL
  Filled 2011-08-30: qty 1

## 2011-08-30 MED ORDER — ENOXAPARIN SODIUM 100 MG/ML ~~LOC~~ SOLN
1.0000 mg/kg | Freq: Two times a day (BID) | SUBCUTANEOUS | Status: DC
  Administered 2011-08-30 – 2011-08-31 (×3): 85 mg via SUBCUTANEOUS
  Filled 2011-08-30 (×6): qty 1

## 2011-08-30 NOTE — Preoperative (Signed)
Beta Blockers   Reason not to administer Beta Blockers:Not Applicable 

## 2011-08-30 NOTE — Transfer of Care (Signed)
Immediate Anesthesia Transfer of Care Note  Patient: Corey [name]  Procedure(s) Performed: Procedure(s) (LRB): TRANSESOPHAGEAL ECHOCARDIOGRAM (TEE) (N/A) CARDIOVERSION (N/A)  Patient Location: Endoscopy Unit  Anesthesia Type: General  Level of Consciousness: awake, alert  and oriented  Airway & Oxygen Therapy: Patient Spontanous Breathing and Patient connected to nasal cannula oxygen  Post-op Assessment: Report given to PACU RN  Post vital signs: Reviewed and stable  Complications: No apparent anesthesia complications

## 2011-08-30 NOTE — Progress Notes (Signed)
Cardiology Progress Note Patient Name: Corey [name] Date of Encounter: 08/30/2011, 8:57 AM     Subjective  No overnight events. Pt denies jaw pain or sob with ambulation.    Objective   Telemetry: A. Fib w/ BiV Pacing   Medications: . amiodarone  400 mg Oral BID  . aspirin EC  81 mg Oral Daily  . atorvastatin  40 mg Oral QHS  . benazepril  20 mg Oral Daily  . enoxaparin (LOVENOX) injection  1 mg/kg Subcutaneous Q12H  . furosemide  40 mg Oral BID  . glimepiride  8 mg Oral QAC breakfast  . insulin aspart  0-9 Units Subcutaneous TID WC  . isosorbide mononitrate  60 mg Oral Daily  . linagliptin  5 mg Oral Daily  . metoprolol  25 mg Oral BID  . potassium chloride SA  20 mEq Oral Daily  . sodium chloride  3 mL Intravenous Q12H  . sodium chloride  3 mL Intravenous Q12H  . warfarin  5 mg Oral q1800  . Warfarin - Physician Dosing Inpatient   Does not apply q1800    Physical Exam: Temp:  [97.6 F (36.4 C)-98.2 F (36.8 C)] 97.6 F (36.4 C) (06/26 0525) Pulse Rate:  [70] 70  (06/26 0525) Resp:  [18] 18  (06/26 0525) BP: (94-106)/(58-65) 106/65 mmHg (06/26 0525) SpO2:  [92 %-96 %] 92 % (06/26 0525) Weight:  [190 lb 11.2 oz (86.5 kg)] 190 lb 11.2 oz (86.5 kg) (06/26 0525)  General: Elderly white male, in no acute distress. Head: Normocephalic, atraumatic, sclera non-icteric, nares are without discharge.  Neck: Supple. Negative forJVD Lungs: Clear bilaterally to auscultation without wheezes, rales, or rhonchi. Breathing is unlabored. Heart: RRR S1 S2 without murmurs, rubs, or gallops.  Abdomen: Soft, non-tender, non-distended with normoactive bowel sounds. No rebound/guarding. No obvious abdominal masses. Msk:  Strength and tone appear normal for age. Extremities: No edema. No clubbing or cyanosis. Distal pedal pulses are intact and equal bilaterally. Neuro: Alert and oriented X 3. Moves all extremities spontaneously. Psych:  Responds to questions appropriately with a flat  affect.   Intake/Output Summary (Last 24 hours) at 08/30/11 0857 Last data filed at 08/30/11 0100  Gross per 24 hour  Intake    480 ml  Output   1100 ml  Net   -620 ml    Labs:  [facility] 08/29/11 0535 08/28/11 0848  NA 136 137  K 4.1 4.2  CL 100 98  CO2 25 26  GLUCOSE 137* 271*  BUN 36* 31*  CREATININE 1.51* 1.34  CALCIUM 8.9 9.4     08/30/2011 05:20  Prothrombin Time 16.2 (H)  INR 1.27   Radiology/Studies:   08/25/2011 - Chest 2 View  Findings: Lungs are well expanded bilaterally, without consolidative airspace disease or pleural effusions.  No pneumothorax.  Pulmonary vasculature is within normal limits. Heart size is mildly enlarged.  Mediastinal contours are unremarkable.  Atherosclerosis in the thoracic aorta.  The patient is status post median sternotomy for CABG with a LIMA to the LAD. Additionally, there has been a mitral annuloplasty.  A left-sided biventricular pacemaker/AICD is in place with lead tips projecting over the expected location of the right atrium, right ventricle and left ventricle (via the coronary sinus and coronary veins).  IMPRESSION: 1.  No radiographic evidence of acute cardiopulmonary disease. 2.  Mild cardiomegaly is unchanged. 3.  Postoperative changes and support apparatus redemonstrated, as above. 4.  Atherosclerosis.  Assessment and Plan  1. Atrial Fibrillation: Remains in a.fib on amiodarone. INR 1.27. Cont coumadin and give increased dose of subQ Lovenox today. TEE guided DCCV today.  2. NSTEMI w/ known severe CAD: [facility] did not feel he was a candidate for redo CABG. Continue medical/conservative treatment with plans for possible PCI once A.Fib treated. No Anginal symptoms. Cont ASA, BB, Imdur, statin  3. Acute on Chronic Systolic CHF: Euvolemic on exam. I/Os net neg 7.7L. Lasix switched to PO on 6/24. Crt up yesterday (no BMET today). Will change lasix to 40mg  daily.  4. Ischemic Cardiomyopathy: EF 25%, BiV ICD in  place   Signed, HOPE, JESSICA PA-C  Cardiology Attending  Patient seen and examined. Will plan TEE guided DCCV later today. He has been given systemic Lovenox and will be on this until INR is therapeutic.  Lewayne Bunting, M.D.

## 2011-08-30 NOTE — Anesthesia Preprocedure Evaluation (Addendum)
Anesthesia Evaluation  Patient identified by MRN, date of birth, ID band Patient awake    Reviewed: Allergy & Precautions, H&P , NPO status , Patient's Chart, lab work & pertinent test results, reviewed documented beta blocker date and time   Airway Mallampati: I TM Distance: >3 FB Neck ROM: Full    Dental No notable dental hx. (+) Teeth Intact, Poor Dentition, Partial Upper, Missing and Dental Advisory Given   Pulmonary shortness of breath and at rest, pneumonia , COPD breath sounds clear to auscultation  Pulmonary exam normal       Cardiovascular hypertension, Pt. on medications + angina + CAD, + Past MI and +CHF negative cardio ROS  + dysrhythmias Atrial Fibrillation + pacemaker + Cardiac Defibrillator Rhythm:Irregular Rate:Normal     Neuro/Psych negative neurological ROS  negative psych ROS   GI/Hepatic negative GI ROS, Neg liver ROS,   Endo/Other  negative endocrine ROSDiabetes mellitus-, Type 2, Oral Hypoglycemic Agents  Renal/GU negative Renal ROS  negative genitourinary   Musculoskeletal   Abdominal   Peds  Hematology negative hematology ROS (+)   Anesthesia Other Findings   Reproductive/Obstetrics negative OB ROS                          Anesthesia Physical Anesthesia Plan  ASA: IV  Anesthesia Plan: General   Post-op Pain Management:    Induction: Intravenous  Airway Management Planned: Mask  Additional Equipment:   Intra-op Plan:   Post-operative Plan:   Informed Consent: I have reviewed the patients History and Physical, chart, labs and discussed the procedure including the risks, benefits and alternatives for the proposed anesthesia with the patient or authorized representative who has indicated his/her understanding and acceptance.   Dental advisory given and Only emergency history available  Plan Discussed with: CRNA and Surgeon  Anesthesia Plan Comments:          Anesthesia Quick Evaluation

## 2011-08-30 NOTE — CV Procedure (Addendum)
    TRANSESOPHAGEAL ECHOCARDIOGRAM GUIDED DIRECT CURRENT CARDIOVERSION  NAME:  Corey [name]   MRN: [id number] DOB:  September 10, 1935   ADMIT DATE: 08/23/2011  INDICATIONS: AF   PROCEDURE:   Informed consent was obtained prior to the procedure. The risks, benefits and alternatives for the procedure were discussed and the patient comprehended these risks.  Risks include, but are not limited to, cough, sore throat, vomiting, nausea, somnolence, esophageal and stomach trauma or perforation, bleeding, low blood pressure, aspiration, pneumonia, infection, trauma to the teeth and death.    After a procedural time-out, the patient was sedated by anesthesia due to his benzodiazepene allergy.The transesophageal probe was inserted in the esophagus and stomach without difficulty and multiple views were obtained.    FINDINGS:  Limited study to assess for LAA appendage clot prior to DC-CV  LEFT VENTRICLE: EF = 20-25% Global HK  RIGHT VENTRICLE: Normal  LEFT ATRIUM: Dilated. No clot.  LEFT ATRIAL APPENDAGE: No clot  RIGHT ATRIUM: Normal  AORTIC VALVE:  Trileaflet. Mildly calcified. No AS/AI  MITRAL VALVE:    S/p repair. Mild MR  TRICUSPID VALVE: Moderate MR  PULMONIC VALVE: Not visualized  INTERATRIAL SEPTUM: No obvious PFO/ASD  PERICARDIUM: No significant effusion.  DESCENDING AORTA: Not visulaized   CARDIOVERSION:     Indications:  Atrial Fibrillation  Procedure Details:  Once the TEE was complete, the patient had the defibrillator pads placed in the anterior and posterior position.Once an appropriate level of sedation was achieved, the patient received a single biphasic, synchronized 150J shock with prompt conversion to sinus rhythm. No apparent complications.  COMPLICATIONS:    There were no immediate complications.   CONCLUSION:   1.  Successful TEE guided cardioversion.   Jamen Loiseau,MD 1:43 PM

## 2011-08-30 NOTE — H&P (View-Only) (Signed)
Patient ID: Corey [name], male   DOB: February 24, 1936, 76 y.o.   MRN: [id number] Subjective:  No chest pain. C/o headache,mild  Objective:  Vital Signs in the last 24 hours: Temp:  [97.8 F (36.6 C)-98.3 F (36.8 C)] 98.3 F (36.8 C) (06/25 0640) Pulse Rate:  [66-70] 70  (06/25 0640) Resp:  [18-19] 19  (06/25 0640) BP: (97-104)/(64-72) 97/64 mmHg (06/25 0640) SpO2:  [95 %-96 %] 96 % (06/25 0640) Weight:  [191 lb 5.8 oz (86.8 kg)] 191 lb 5.8 oz (86.8 kg) (06/25 0640)  Intake/Output from previous day: 06/24 0701 - 06/25 0700 In: 1440 [P.O.:1440] Out: 1325 [Urine:1325] Intake/Output from this shift:    Physical Exam: Well appearing NAD HEENT: Unremarkable Neck:  7 cm JVD, no thyromegally Lymphatics:  No adenopathy Back:  No CVA tenderness Lungs:  Clear with no wheezes HEART:  Regular rate rhythm, no murmurs, no rubs, no clicks Abd:  Flat, positive bowel sounds, no organomegally, no rebound, no guarding Ext:  2 plus pulses, no edema, no cyanosis, no clubbing Skin:  No rashes no nodules Neuro:  CN II through XII intact, motor grossly intact  Lab Results: No results found for this basename: WBC:2,HGB:2,PLT:2 in the last 72 hours  Basename 08/29/11 0535 08/28/11 0848  NA 136 137  K 4.1 4.2  CL 100 98  CO2 25 26  GLUCOSE 137* 271*  BUN 36* 31*  CREATININE 1.51* 1.34   No results found for this basename: TROPONINI:2,CK,MB:2 in the last 72 hours Hepatic Function Panel No results found for this basename: PROT,ALBUMIN,AST,ALT,ALKPHOS,BILITOT,BILIDIR,IBILI in the last 72 hours No results found for this basename: CHOL in the last 72 hours No results found for this basename: PROTIME in the last 72 hours  Imaging: No results found.  Cardiac Studies: Tele - atrial fib with Biv pacing Assessment/Plan:  1. Atrial fib - he has been on amiodarone for 24 hours. Hopefully he will tolerate. Will plan DCCV tomorrow. Will give lovenox if INR is not therapeutic. 2. CAD - his anginal  symptoms have improved. He walked the halls yesterday with out pain. 3. Chronic systolic CHF - he appears to be well compensated. Continue current meds.  LOS: 6 days    Lewayne Bunting M.D. 08/29/2011, 8:36 AM

## 2011-08-30 NOTE — Interval H&P Note (Signed)
History and Physical Interval Note:  08/30/2011 12:51 PM  Army Fossa  has presented today for surgery, with the diagnosis of a-fib  The various methods of treatment have been discussed with the patient and family. After consideration of risks, benefits and other options for treatment, the patient has consented to  Procedure(s) (LRB): TRANSESOPHAGEAL ECHOCARDIOGRAM (TEE) (N/A) CARDIOVERSION (N/A) as a surgical intervention .  The patient's history has been reviewed, patient examined, no change in status, stable for surgery.  I have reviewed the patients' chart and labs.  Questions were answered to the patient's satisfaction.    Given allergy to benzodiazapenes will ask anesthesiology for assistance with sedation for entire procedure.   Dorse Locy

## 2011-08-30 NOTE — Progress Notes (Signed)
  Echocardiogram Echocardiogram Transesophageal has been performed.  Georgian Co 08/30/2011, 1:45 PM

## 2011-08-30 NOTE — Anesthesia Postprocedure Evaluation (Signed)
  Anesthesia Post-op Note  Patient: Corey [name]  Procedure(s) Performed: Procedure(s) (LRB): TRANSESOPHAGEAL ECHOCARDIOGRAM (TEE) (N/A) CARDIOVERSION (N/A)  Patient Location: Endoscopy Unit  Anesthesia Type: General  Level of Consciousness: awake  Airway and Oxygen Therapy: Patient Spontanous Breathing and Patient connected to nasal cannula oxygen  Post-op Pain: none  Post-op Assessment: Post-op Vital signs reviewed, Patient's Cardiovascular Status Stable, Respiratory Function Stable, Patent Airway and No signs of Nausea or vomiting  Post-op Vital Signs: Reviewed and stable  Complications: No apparent anesthesia complications

## 2011-08-31 ENCOUNTER — Encounter (HOSPITAL_COMMUNITY): Payer: Self-pay | Admitting: Internal Medicine

## 2011-08-31 LAB — CBC
HCT: 42.1 % (ref 39.0–52.0)
Hemoglobin: 13.9 g/dL (ref 13.0–17.0)
RBC: 4.44 MIL/uL (ref 4.22–5.81)

## 2011-08-31 LAB — GLUCOSE, CAPILLARY
Glucose-Capillary: 197 mg/dL — ABNORMAL HIGH (ref 70–99)
Glucose-Capillary: 149 mg/dL — ABNORMAL HIGH (ref 70–99)
Glucose-Capillary: 206 mg/dL — ABNORMAL HIGH (ref 70–99)

## 2011-08-31 LAB — PROTIME-INR
INR: 1.5 — ABNORMAL HIGH (ref 0.00–1.49)
Prothrombin Time: 18.4 seconds — ABNORMAL HIGH (ref 11.6–15.2)

## 2011-08-31 MED ORDER — FUROSEMIDE 40 MG PO TABS: 60.0000 mg | ORAL_TABLET | Freq: Every day | ORAL | Status: DC

## 2011-08-31 MED ORDER — AMIODARONE HCL 400 MG PO TABS: 400.0000 mg | ORAL_TABLET | Freq: Every day | ORAL | Status: DC

## 2011-08-31 MED ORDER — ENOXAPARIN SODIUM 80 MG/0.8ML ~~LOC~~ SOLN: 80.0000 mg | Freq: Two times a day (BID) | SUBCUTANEOUS | Status: DC

## 2011-08-31 NOTE — Discharge Summary (Signed)
ELECTROPHYSIOLOGY DISCHARGE SUMMARY    Patient ID: Corey [name],  MRN: [id number], DOB/AGE: 74-Aug-1937 74 y.o.  Admit date: 08/23/2011 Discharge date: 08/31/2011  Primary Care Physician: Venora Maples, MD Primary Cardiologist: Shawnie Pons, MD Primary Electrophysiologist: Lewayne Bunting, MD  Primary Discharge Diagnosis:  1. CAD s/p recent NSTEMI 2. Atrial fibrillation s/p TEE-guided DCCV 3. Chronic systolic CHF  Secondary Discharge Diagnoses:  1. Ischemic cardiomyopathy, EF 20%, BiV ICD in place 2. DM 3. Dyslipidemia 4. CKD 5. MR s/p MV repair 6. HTN 7. COPD  Procedures This Admission:  1.  TEE-guided DCCV on 08/30/2011 (please see procedure note for full details)  History and Hospital Course:  For full details of history, please see admission H&P. Briefly, Mr. Higham is a 76 year old gentleman with PMHx significant for CAD (s/p multiple cardiac catheterizations, multiple stent placements, CABG x 4 with re-do and MV repair on 06/2004), ischemic cardiomyopathy, history of atrial fibrillation and VT (s/p BiV ICD implantation 07/2005), history of MR (s/p MV repair 06/2004), type 2 DM, HTN, HL and COPD who was admitted earlier this month with NSTEMI. He LHC revealed severe native coronary artery disease. He has moderate to severe disease of the saphenous vein graft to the acute marginal system. An attempt was made to treat him medically but he had episodes of recurrent severe chest pain with daily activities, prompting re-admission. A repeat cardiac catheterization was considered but given his previous LHC findings, Dr. [name] requested consultation from [facility] to determine if re-do CABG was indicated. Dr. Tyrone Sage did not feel Mr. Boutelle is a surgical candidate, nor did he feel redo CABG would improve LV function/CHF. Therefore, optimization of his medical therapy was recommended. His chest pain resolved. In addition, he was found to be in atrial fibrillation. He was started on amiodarone  for rhythm control and warfarin for embolic prophylaxis. He then underwent TEE-guided DCCV on 08/30/2011 which was successful in restoring sinus rhythm with BiV pacing. He has been seen, examined and deemed stable for discharge today by Dr. Lewayne Bunting.  Physical Exam: Vitals: BP 110/70, pulse 60, temp 98.4 F (36.9 C), resp 18, height 5\' 9"  (1.753 m), weight 188 lb 9.6 oz (85.548 kg), SpO2 97% General: Well developed, well appearing 76 year old male in no acute distress. Neck: Supple. No JVD. Heart: RRR. No murmur, rub, S3 or S4. Lungs: CTA bilaterally. No wheezes, rales or rhonchi. Extremities: No cyanosis, clubbing or edema.  Neuro: Alert and oriented x 3. No focal deficits.  Labs: Lab Results  Component Value Date   WBC 7.9 08/31/2011   HGB 13.9 08/31/2011   HCT 42.1 08/31/2011   MCV 94.8 08/31/2011   PLT 166 08/31/2011     Lab 08/30/11 0935  NA 138  K 4.9  CL 100  CO2 26  BUN 36*  CREATININE 1.57*  CALCIUM 9.3  PROT --  BILITOT --  ALKPHOS --  ALT --  AST --  GLUCOSE 149*   Lab Results  Component Value Date   CKTOTAL 89 08/24/2011   CKMB 1.9 08/24/2011   TROPONINI <0.30 08/24/2011     Basename 08/31/11 0500  INR 1.50*    Disposition:  The patient is being discharged in stable condition.  Follow-up:  Follow up with [facility] on 09/18/2011. (Check in at 11:30 AM for BiV ICD generator change with Dr. Lewayne Bunting)    Contact information:   [address] [place] [address]    Follow up  with [facility] on 09/04/2011. (At 10:30 AM for Coumadin follow-up (INR check))    Contact information:   [address]  [address] [place] [address] [phone]   Discharge Medications:   STOP taking these medications     clopidogrel 75 MG tablet      dofetilide 500 MCG capsule       TAKE these medications     amiodarone 400 MG tablet   Commonly known as: PACERONE   Take 1 tablet (400 mg total) by  mouth daily.      aspirin 81 MG tablet   Take 81 mg by mouth daily.      atorvastatin 40 MG tablet   Commonly known as: LIPITOR   Take 1 tablet (40 mg total) by mouth at bedtime.      benazepril 20 MG tablet   Commonly known as: LOTENSIN   Take 1 tablet (20 mg total) by mouth daily.      enoxaparin 80 MG/0.8ML injection   Commonly known as: LOVENOX   Inject 0.8 mLs (80 mg total) into the skin every 12 (twelve) hours. Until INR therapeutic (goal INR 2-3).      fluticasone 50 MCG/ACT nasal spray   Commonly known as: FLONASE   Place 1 spray into the nose as needed. For stuffy nose and sinuses      furosemide 40 MG tablet   Commonly known as: LASIX   Take 1.5 tablets (60 mg total) by mouth daily.      glimepiride 4 MG tablet   Commonly known as: AMARYL   Take 8 mg by mouth daily before breakfast.      isosorbide mononitrate 60 MG 24 hr tablet   Commonly known as: IMDUR   Take 1 tablet (60 mg total) by mouth daily.      metFORMIN 500 MG tablet   Commonly known as: GLUCOPHAGE   Take 500 mg by mouth every evening.      metoprolol 50 MG tablet   Commonly known as: LOPRESSOR   Take 25 mg by mouth 2 (two) times daily. 1/2 tab po bid      nitroGLYCERIN 0.4 MG SL tablet   Commonly known as: NITROSTAT   Place 1 tablet (0.4 mg total) under the tongue every 5 (five) minutes as needed for chest pain (up to 3 doses).      potassium chloride SA 20 MEQ tablet   Commonly known as: K-DUR,KLOR-CON   Take 20 mEq by mouth daily.      saxagliptin HCl 2.5 MG Tabs tablet   Commonly known as: ONGLYZA   Take 2.5 mg by mouth every evening.      triazolam 0.25 MG tablet   Commonly known as: HALCION   Take 0.125 mg by mouth at bedtime as needed. For sleep      Duration of Discharge Encounter: Greater than 30 minutes including physician time.  Signed, Rick Duff, PA-C 08/31/2011, 12:19 PM

## 2011-09-04 ENCOUNTER — Ambulatory Visit (INDEPENDENT_AMBULATORY_CARE_PROVIDER_SITE_OTHER): Payer: Medicare Other

## 2011-09-04 ENCOUNTER — Encounter (HOSPITAL_COMMUNITY): Payer: Self-pay | Admitting: Pharmacist

## 2011-09-04 DIAGNOSIS — I4891 Unspecified atrial fibrillation: Secondary | ICD-10-CM

## 2011-09-04 DIAGNOSIS — Z7901 Long term (current) use of anticoagulants: Secondary | ICD-10-CM

## 2011-09-04 LAB — POCT INR: INR: 1.3

## 2011-09-04 MED ORDER — WARFARIN SODIUM 5 MG PO TABS: 5.0000 mg | ORAL_TABLET | ORAL | Status: DC

## 2011-09-04 NOTE — Progress Notes (Signed)
Pt confused as to whether he is taking warfarin or not. Called [facility] pharmacy and confirmed no warfarin RX has been filled.  Pt says he has bottle of warfarin at home and will call me back with info.  Otherwise I have spoken with Tiffany in [place] office.  She will talk with Nehemiah Settle, PA/MD and call pt back at home with instructions.  Pt verb. Understanding.

## 2011-09-08 ENCOUNTER — Ambulatory Visit (INDEPENDENT_AMBULATORY_CARE_PROVIDER_SITE_OTHER): Payer: Medicare Other | Admitting: *Deleted

## 2011-09-08 DIAGNOSIS — Z7901 Long term (current) use of anticoagulants: Secondary | ICD-10-CM

## 2011-09-08 DIAGNOSIS — I4891 Unspecified atrial fibrillation: Secondary | ICD-10-CM

## 2011-09-09 ENCOUNTER — Telehealth: Payer: Self-pay | Admitting: Nurse Practitioner

## 2011-09-09 NOTE — Telephone Encounter (Signed)
Pt called this AM stating that ever since being started on Amiodarone, he has had significant nausea and abdominal bloating.  He has not taken it yet this AM and feels better.  He spoke with his pharmacist who advised that Amio is the likely culprit.  I agree.  I advised to hold Amio this weekend and call into the office on Monday for further instructions from Dr. [name], as we may want to see if he can tolerate 200mg  Daily before giving up on it all together.  He has already failed tikosyn therapy and is fearful that without any antiarrhythmic on board, he will go back into afib.

## 2011-09-11 ENCOUNTER — Telehealth: Payer: Self-pay | Admitting: Internal Medicine

## 2011-09-11 NOTE — Telephone Encounter (Signed)
New msg °Pt wants to talk to you about his meds. °

## 2011-09-11 NOTE — Telephone Encounter (Signed)
I would suggest 200 daily taken at night. Do not stop amio.  GT

## 2011-09-11 NOTE — Telephone Encounter (Signed)
SPOKE WITH PT  WAS UNABLE TO TAKE  AMIODARONE  PT SPOKE WITH  ON CALL  MD ON SAT AND WAS TOLD TO STOP  HAS NOT TAKEN SAT,SUN, OR TODAY  WAS EXTREMELY NAUSEATED DID NOT TOLERATE  WILL FORWARD TO DR [name] FRO REVIEW .Zack Seal

## 2011-09-12 ENCOUNTER — Telehealth: Payer: Self-pay

## 2011-09-12 NOTE — Telephone Encounter (Signed)
Pts wife called to say they have ICD gen change scheduled with Dr. [name] for 7/15. She asks if there is any pre op needed.  I explained labs and CXR are all within timeframe needed and pt does not need any further testing/pre op.  I confirmed with wife that pt has instruction letter given by Dr. Lubertha Basque office.

## 2011-09-13 ENCOUNTER — Ambulatory Visit (INDEPENDENT_AMBULATORY_CARE_PROVIDER_SITE_OTHER): Payer: Medicare Other

## 2011-09-13 DIAGNOSIS — Z7901 Long term (current) use of anticoagulants: Secondary | ICD-10-CM

## 2011-09-13 DIAGNOSIS — I4891 Unspecified atrial fibrillation: Secondary | ICD-10-CM

## 2011-09-18 ENCOUNTER — Ambulatory Visit (HOSPITAL_COMMUNITY)
Admission: RE | Admit: 2011-09-18 | Discharge: 2011-09-18 | Disposition: A | Discharge status: Home / Self Care | Payer: Medicare Other | Source: Ambulatory Visit | Attending: Internal Medicine | Admitting: Internal Medicine

## 2011-09-18 ENCOUNTER — Encounter (HOSPITAL_COMMUNITY)
Admission: RE | Disposition: A | Discharge status: Home / Self Care | Payer: Self-pay | Source: Ambulatory Visit | Attending: Internal Medicine

## 2011-09-18 ENCOUNTER — Ambulatory Visit: Payer: Medicare Other | Admitting: Cardiology

## 2011-09-18 ENCOUNTER — Ambulatory Visit (HOSPITAL_COMMUNITY): Admit: 2011-09-18 | Payer: Self-pay | Admitting: Internal Medicine

## 2011-09-18 DIAGNOSIS — I4891 Unspecified atrial fibrillation: Secondary | ICD-10-CM | POA: Insufficient documentation

## 2011-09-18 DIAGNOSIS — I2589 Other forms of chronic ischemic heart disease: Secondary | ICD-10-CM | POA: Insufficient documentation

## 2011-09-18 DIAGNOSIS — I4729 Other ventricular tachycardia: Secondary | ICD-10-CM | POA: Insufficient documentation

## 2011-09-18 DIAGNOSIS — I472 Ventricular tachycardia, unspecified: Secondary | ICD-10-CM | POA: Insufficient documentation

## 2011-09-18 DIAGNOSIS — J449 Chronic obstructive pulmonary disease, unspecified: Secondary | ICD-10-CM | POA: Insufficient documentation

## 2011-09-18 DIAGNOSIS — Z4502 Encounter for adjustment and management of automatic implantable cardiac defibrillator: Secondary | ICD-10-CM | POA: Insufficient documentation

## 2011-09-18 DIAGNOSIS — I10 Essential (primary) hypertension: Secondary | ICD-10-CM | POA: Insufficient documentation

## 2011-09-18 DIAGNOSIS — I447 Left bundle-branch block, unspecified: Secondary | ICD-10-CM | POA: Insufficient documentation

## 2011-09-18 DIAGNOSIS — I5022 Chronic systolic (congestive) heart failure: Secondary | ICD-10-CM | POA: Insufficient documentation

## 2011-09-18 DIAGNOSIS — J4489 Other specified chronic obstructive pulmonary disease: Secondary | ICD-10-CM | POA: Insufficient documentation

## 2011-09-18 HISTORY — PX: BI-VENTRICULAR IMPLANTABLE CARDIOVERTER DEFIBRILLATOR: SHX5459

## 2011-09-18 LAB — BASIC METABOLIC PANEL
BUN: 33 mg/dL — ABNORMAL HIGH (ref 6–23)
Calcium: 8.5 mg/dL (ref 8.4–10.5)
Creatinine, Ser: 1.65 mg/dL — ABNORMAL HIGH (ref 0.50–1.35)
GFR calc non Af Amer: 40 mL/min — ABNORMAL LOW (ref >90)
Glucose, Bld: 113 mg/dL — ABNORMAL HIGH (ref 70–99)
Potassium: 4 mEq/L (ref 3.5–5.1)

## 2011-09-18 LAB — CBC
HCT: 37.6 % — ABNORMAL LOW (ref 39.0–52.0)
Hemoglobin: 12.6 g/dL — ABNORMAL LOW (ref 13.0–17.0)
MCH: 30.6 pg (ref 26.0–34.0)
MCHC: 33.5 g/dL (ref 30.0–36.0)
MCV: 91.3 fL (ref 78.0–100.0)
RDW: 15.3 % (ref 11.5–15.5)

## 2011-09-18 SURGERY — IMPLANTABLE CARDIOVERTER DEFIBRILLATOR GENERATOR CHANGE
Anesthesia: LOCAL

## 2011-09-18 SURGERY — BI-VENTRICULAR IMPLANTABLE CARDIOVERTER DEFIBRILLATOR  (CRT-D)
Anesthesia: Moderate Sedation | Laterality: Left

## 2011-09-18 MED ORDER — MUPIROCIN 2 % EX OINT
TOPICAL_OINTMENT | CUTANEOUS | Status: AC
  Filled 2011-09-18: qty 22

## 2011-09-18 MED ORDER — LIDOCAINE HCL (PF) 1 % IJ SOLN
INTRAMUSCULAR | Status: AC
  Filled 2011-09-18: qty 60

## 2011-09-18 MED ORDER — FENTANYL CITRATE 0.05 MG/ML IJ SOLN
INTRAMUSCULAR | Status: AC
  Filled 2011-09-18: qty 2

## 2011-09-18 MED ORDER — MIDAZOLAM HCL 5 MG/5ML IJ SOLN
INTRAMUSCULAR | Status: AC
  Filled 2011-09-18: qty 5

## 2011-09-18 MED ORDER — SODIUM CHLORIDE 0.9 % IR SOLN
80.0000 mg | Status: DC
  Filled 2011-09-18 (×2): qty 2

## 2011-09-18 MED ORDER — SODIUM CHLORIDE 0.45 % IV SOLN
INTRAVENOUS | Status: DC
  Administered 2011-09-18: 13:00:00 via INTRAVENOUS

## 2011-09-18 MED ORDER — CEFAZOLIN SODIUM-DEXTROSE 2-3 GM-% IV SOLR
2.0000 g | INTRAVENOUS | Status: DC
  Filled 2011-09-18 (×2): qty 50

## 2011-09-18 MED ORDER — ONDANSETRON HCL 4 MG/2ML IJ SOLN: 4.0000 mg | Freq: Four times a day (QID) | INTRAMUSCULAR | Status: DC | PRN

## 2011-09-18 MED ORDER — PROMETHAZINE HCL 25 MG/ML IJ SOLN
INTRAMUSCULAR | Status: AC
  Filled 2011-09-18: qty 1

## 2011-09-18 MED ORDER — SODIUM CHLORIDE 0.9 % IV SOLN: 250.0000 mL | INTRAVENOUS | Status: DC

## 2011-09-18 MED ORDER — ACETAMINOPHEN 325 MG PO TABS: 325.0000 mg | ORAL_TABLET | ORAL | Status: DC | PRN

## 2011-09-18 MED ORDER — SODIUM CHLORIDE 0.9 % IJ SOLN: 3.0000 mL | INTRAMUSCULAR | Status: DC | PRN

## 2011-09-18 MED ORDER — SODIUM CHLORIDE 0.9 % IJ SOLN: 3.0000 mL | Freq: Two times a day (BID) | INTRAMUSCULAR | Status: DC

## 2011-09-18 MED ORDER — MUPIROCIN 2 % EX OINT: TOPICAL_OINTMENT | Freq: Two times a day (BID) | CUTANEOUS | Status: DC

## 2011-09-18 NOTE — H&P (Signed)
Corey [name] is an 76 y.o. male.   Chief Complaint: I am here to have my ICD changed out. HPI: 76 yo man with a longstanding ICM, LBBB, chronic systolic heart failure and atrial fib on coumadin admitted for ICD removal and insertion of a new device. He is at [facility]. He has a long h/o CHF and chest pain. See recent discharge summary for details of prior hospitalization. He returns today. He has been intolerant of amiodarone secondary to nausea and has stopped this medication.   Past Medical History  Diagnosis Date  . Atrial fibrillation     s/p ablation, on tikosyn  . CAD, ARTERY BYPASS GRAFT     4v CABG w/ redo '06; cath 08/14/11 high grade lesion in OM graft, total occlusion of SVG to RCA with collaterals,  patent  LIMA to LAD,occlusion of SVG to Diagonal, and severe segmental LV systolic dysfunction, EF 20%   . CARDIOMYOPATHY, ISCHEMIC   . Chronic systolic heart failure   . Dysuria   . HYPERLIPIDEMIA-MIXED   . RENAL INSUFFICIENCY   . Mitral regurgitation      s/p MV repair '06  . HTN (hypertension)   . COPD (chronic obstructive pulmonary disease)   . Complication of anesthesia     "liked to went crazy w/the mess they gave me; started w/an  A"  . CHF (congestive heart failure)   . Myocardial infarction 1987; ~ 08/17/11  . Anginal pain   . Pacemaker 09-15-2011    "battery is dead; it doesn't work"  . ICD (implantable cardiac defibrillator) in place 2011-09-15    "battery is dead; it doesn't work"  . Pneumonia     h/o "walking pneumonia; long time ago"  . Shortness of breath Sep 15, 2011    "all the time"  . Type II diabetes mellitus   . Arthritis     "in the neck after neck OR"    Past Surgical History  Procedure Date  . Mitral valve repair     with an angioplasty ring  . Cervical discectomy     C3-C4  . Cardiac defibrillator placement ~ 2007    Guidant  . Coronary artery bypass graft 1995    CABG X 2  . Coronary artery bypass graft 2002    CABG "& fixed hole in back of the heart"    . Coronary angioplasty with stent placement     "2 I think"  . Inguinal hernia repair     bilaterally  . Excisional hemorrhoidectomy   . Anterior fusion cervical spine     "went down in my throat to do it"  . Tee without cardioversion 08/30/2011    Procedure: TRANSESOPHAGEAL ECHOCARDIOGRAM (TEE);  Surgeon: Dolores Patty, MD;  Location: [facility] ENDOSCOPY;  Service: Cardiovascular;  Laterality: N/A;  . Cardioversion 08/30/2011    Procedure: CARDIOVERSION;  Surgeon: Dolores Patty, MD;  Location: [facility] ENDOSCOPY;  Service: Cardiovascular;  Laterality: N/A;    Family History  Problem Relation Age of Onset  . Hypertension Other    Social History:  reports that he quit smoking about 26 years ago. His smoking use included Cigarettes and Pipe. He has a 60 pack-year smoking history. He quit smokeless tobacco use about 53 years ago. His smokeless tobacco use included Snuff and Chew. He reports that he drinks alcohol. He reports that he does not use illicit drugs.  Allergies:  Allergies  Allergen Reactions  . Lorazepam Other (See Comments)    "became unresponsive"  . Codeine  Other (See Comments)    "made skin crawl"    Medications Prior to Admission  Medication Sig Dispense Refill  . aspirin 81 MG tablet Take 81 mg by mouth daily.       Marland Kitchen atorvastatin (LIPITOR) 40 MG tablet Take 1 tablet (40 mg total) by mouth at bedtime.  30 tablet  2  . benazepril (LOTENSIN) 20 MG tablet Take 1 tablet (20 mg total) by mouth daily.  90 tablet  3  . furosemide (LASIX) 40 MG tablet Take 20-40 mg by mouth 2 (two) times daily. 1 tab in the morning and 0.5 tab in the evening      . glimepiride (AMARYL) 4 MG tablet Take 8 mg by mouth daily before breakfast.      . isosorbide mononitrate (IMDUR) 60 MG 24 hr tablet Take 60 mg by mouth daily.      . metFORMIN (GLUCOPHAGE-XR) 500 MG 24 hr tablet Take 500 mg by mouth daily with supper.      . metoprolol (LOPRESSOR) 50 MG tablet Take 25 mg by mouth 2 (two) times  daily.       . potassium chloride SA (K-DUR,KLOR-CON) 20 MEQ tablet Take 10-20 mEq by mouth 2 (two) times daily. 0.5 tab in the morning and 1 tab in the evening      . triazolam (HALCION) 0.25 MG tablet Take 0.25 mg by mouth at bedtime.       Marland Kitchen warfarin (COUMADIN) 5 MG tablet Take 1 tablet (5 mg total) by mouth as directed.  45 tablet  3  . acetaminophen (TYLENOL) 500 MG tablet Take 1,000 mg by mouth every 4 (four) hours as needed. For pain      . fluticasone (FLONASE) 50 MCG/ACT nasal spray Place 2 sprays into the nose 4 (four) times daily as needed. For stuffy nose and sinuses      . nitroGLYCERIN (NITROSTAT) 0.4 MG SL tablet Place 0.4 mg under the tongue every 5 (five) minutes x 3 doses as needed. For chest pain        Results for orders placed during the hospital encounter of 09/18/11 (from the past 48 hour(s))  GLUCOSE, CAPILLARY     Status: Abnormal   Collection Time   09/18/11 11:41 AM      Component Value Range Comment   Glucose-Capillary 102 (*) 70 - 99 mg/dL    No results found.  ROS All systemes reveiwed and negative except as noted in the HPI.  Blood pressure 96/57, pulse 61, temperature 96.8 F (36 C), temperature source Oral, resp. rate 18, height 5\' 9"  (1.753 m), weight 185 lb (83.915 kg), SpO2 95.00%.  Physical Exam Well appearing 76 yo man, NAD HEENT: Unremarkable Neck:  7 cm JVD, no thyromegally Lungs:  Clear except for basilar rales.  HEART:  Regular rate rhythm, no murmurs, no rubs, no clicks Abd:  Flat, positive bowel sounds, no organomegally, no rebound, no guarding Ext:  2 plus pulses, no edema, no cyanosis, no clubbing Skin:  No rashes no nodules Neuro:  CN II through XII intact, motor grossly intact    Assessment/Plan 1. ICD at [facility] 2. ICM 3. Chronic systolic chf  Rec: I have discussed the indications, risks/benefits/goals/expectations of ICD generator change and he wishes to proceed.  Lewayne Bunting, M.D.  Lewayne Bunting 09/18/2011, 12:00 PM

## 2011-09-18 NOTE — Op Note (Signed)
ICD removal and insertion of a new ICD system with DFT testing without immediate complication. [id number].

## 2011-09-18 NOTE — Progress Notes (Signed)
PRESSURE DRESSING REMOVED

## 2011-09-19 ENCOUNTER — Telehealth: Payer: Self-pay | Admitting: Cardiology

## 2011-09-19 NOTE — Op Note (Signed)
NAMEJAVARRI, Corey [name]                 ACCOUNT NO.:  [id number]  MEDICAL RECORD NO.:  [id number]  LOCATION:  [facility]                         FACILITY:  [facility]  PHYSICIAN:  [name]. [name], MD    DATE OF BIRTH:  10-Jan-1936  DATE OF PROCEDURE:  09/18/2011 DATE OF DISCHARGE:  09/18/2011                              OPERATIVE REPORT   PROCEDURE PERFORMED:  Removal of previously implanted ICD which had reached elective replacement and insertion of a new ICD system with defibrillation threshold testing..  INTRODUCTION:  The patient is a 76 year old male with an ischemic cardiomyopathy, left bundle-branch block, class III heart failure, atrial fibrillation, and ventricular tachycardia.  He has reached device elective replacement.  He is now referred for ICD generator removal and insertion of a new device.  PROCEDURE:  After informed consent was obtained, the patient was taken to the diagnostic EP lab in a fasting state.  After usual preparation and draping, intravenous fentanyl and midazolam was given for sedation. A 30 mL of lidocaine was infiltrated into the left infraclavicular region.  A 6 cm incision was carried out over this region. Electrocautery was utilized to dissect down to the fascial plane.  The ICD was removed without difficulty.  The leads were evaluated and found to be working satisfactorily.  The LV threshold was chronically elevated.  The [employer] CRT-D, BiV ICD, serial [id number] was connected to the atrial RV and LV leads and placed back in the subcutaneous pocket.  The pocket was irrigated with antibiotic irrigation and incision was closed with 2-0 and 3-0 Vicryl.  Benzoin and Steri-Strips were painted on the skin.  A pressure dressing was applied and defibrillation threshold testing was carried out.  At this point, I scrubbed out of the case.  Deep sedation was administered under my direct supervision with fentanyl and Versed, VF was induced with a T-wave  shock.  A 17 joule shock was delivered, which terminated VF and restored sinus rhythm.  No additional defibrillation threshold testing was carried out.  The patient was returned to his room in satisfactory condition.  COMPLICATIONS:  There were no immediate procedure complications.  RESULTS:  This demonstrates successful ICD removal and insertion of a new device without immediate procedure complication.     [name]. [name], MD     GWT/MEDQ  D:  09/18/2011  T:  09/19/2011  Job:  [id number]

## 2011-09-19 NOTE — Telephone Encounter (Signed)
C/o nausea all day worsening when he takes each med Lipitor at night w/ evening meal and benazepril in the morning, pt wants diff med recommended. Informed pt  Dr [name] will review on Friday 09/22/11 and nurse will call him with recommendations. Pt is currently eating with med's to try to help nausea but it isn't helping. Pt agreed to plan. Please review and advise.  FYI Pt had BiV ICD change yesterday and wanted to know when he needed f/u. No app seen, i left msg w/ Gunnar Fusi and Belenda Cruise in device clinic to contact pt.

## 2011-09-19 NOTE — Telephone Encounter (Signed)
Per pt call lipitor & benazepril make him sick and nausea

## 2011-09-19 NOTE — Telephone Encounter (Signed)
Patient called the cath lab this evening reporting a persistent cough and cannot stop coughing. He generally feels worse today. He recently had a generator change. Due to possibility of complication I advised he proceed to ER but he refused. I reiterated risk of his decision but he said he would not go. He is A+Ox3 and sound like he is in sound state of mind. He has Coumadin clinic appointment tomorrow and agreed to reporting his symptoms to our office then. Dayna Dunn PA-C

## 2011-09-20 ENCOUNTER — Ambulatory Visit (INDEPENDENT_AMBULATORY_CARE_PROVIDER_SITE_OTHER): Payer: Medicare Other

## 2011-09-20 DIAGNOSIS — I4891 Unspecified atrial fibrillation: Secondary | ICD-10-CM

## 2011-09-20 DIAGNOSIS — Z7901 Long term (current) use of anticoagulants: Secondary | ICD-10-CM

## 2011-09-22 NOTE — Telephone Encounter (Signed)
Dr [name] attempted to contact the pt.  He left a message on the pt's answering machine.

## 2011-09-25 ENCOUNTER — Inpatient Hospital Stay (HOSPITAL_COMMUNITY)
Admission: EM | Admit: 2011-09-25 | Discharge: 2011-09-30 | DRG: 293 | Disposition: A | Discharge status: Home / Self Care | Payer: Medicare Other | Attending: Internal Medicine | Admitting: Internal Medicine

## 2011-09-25 ENCOUNTER — Emergency Department (HOSPITAL_COMMUNITY): Payer: Medicare Other

## 2011-09-25 ENCOUNTER — Telehealth: Payer: Self-pay | Admitting: Internal Medicine

## 2011-09-25 ENCOUNTER — Telehealth: Payer: Self-pay | Admitting: Cardiology

## 2011-09-25 ENCOUNTER — Encounter (HOSPITAL_COMMUNITY): Payer: Self-pay | Admitting: Emergency Medicine

## 2011-09-25 DIAGNOSIS — R110 Nausea: Secondary | ICD-10-CM

## 2011-09-25 DIAGNOSIS — I5023 Acute on chronic systolic (congestive) heart failure: Secondary | ICD-10-CM

## 2011-09-25 DIAGNOSIS — K219 Gastro-esophageal reflux disease without esophagitis: Secondary | ICD-10-CM

## 2011-09-25 DIAGNOSIS — J449 Chronic obstructive pulmonary disease, unspecified: Secondary | ICD-10-CM | POA: Diagnosis present

## 2011-09-25 DIAGNOSIS — N289 Disorder of kidney and ureter, unspecified: Secondary | ICD-10-CM | POA: Diagnosis present

## 2011-09-25 DIAGNOSIS — R634 Abnormal weight loss: Secondary | ICD-10-CM

## 2011-09-25 DIAGNOSIS — Z9861 Coronary angioplasty status: Secondary | ICD-10-CM

## 2011-09-25 DIAGNOSIS — I509 Heart failure, unspecified: Secondary | ICD-10-CM | POA: Diagnosis present

## 2011-09-25 DIAGNOSIS — K5900 Constipation, unspecified: Secondary | ICD-10-CM | POA: Diagnosis present

## 2011-09-25 DIAGNOSIS — I214 Non-ST elevation (NSTEMI) myocardial infarction: Secondary | ICD-10-CM | POA: Diagnosis present

## 2011-09-25 DIAGNOSIS — E119 Type 2 diabetes mellitus without complications: Secondary | ICD-10-CM | POA: Diagnosis present

## 2011-09-25 DIAGNOSIS — I4891 Unspecified atrial fibrillation: Secondary | ICD-10-CM | POA: Diagnosis present

## 2011-09-25 DIAGNOSIS — I2589 Other forms of chronic ischemic heart disease: Secondary | ICD-10-CM | POA: Diagnosis present

## 2011-09-25 DIAGNOSIS — J4489 Other specified chronic obstructive pulmonary disease: Secondary | ICD-10-CM | POA: Diagnosis present

## 2011-09-25 DIAGNOSIS — I5022 Chronic systolic (congestive) heart failure: Secondary | ICD-10-CM | POA: Diagnosis present

## 2011-09-25 DIAGNOSIS — Z95810 Presence of automatic (implantable) cardiac defibrillator: Secondary | ICD-10-CM

## 2011-09-25 DIAGNOSIS — E785 Hyperlipidemia, unspecified: Secondary | ICD-10-CM | POA: Diagnosis present

## 2011-09-25 DIAGNOSIS — I10 Essential (primary) hypertension: Secondary | ICD-10-CM | POA: Diagnosis present

## 2011-09-25 DIAGNOSIS — Z951 Presence of aortocoronary bypass graft: Secondary | ICD-10-CM

## 2011-09-25 DIAGNOSIS — N259 Disorder resulting from impaired renal tubular function, unspecified: Secondary | ICD-10-CM | POA: Diagnosis present

## 2011-09-25 DIAGNOSIS — I2510 Atherosclerotic heart disease of native coronary artery without angina pectoris: Secondary | ICD-10-CM | POA: Diagnosis present

## 2011-09-25 LAB — PRO B NATRIURETIC PEPTIDE: Pro B Natriuretic peptide (BNP): 4403 pg/mL — ABNORMAL HIGH (ref 0–125)

## 2011-09-25 LAB — CBC
HCT: 38.4 % — ABNORMAL LOW (ref 39.0–52.0)
Hemoglobin: 12.7 g/dL — ABNORMAL LOW (ref 13.0–17.0)
MCH: 30 pg (ref 26.0–34.0)
MCHC: 33.1 g/dL (ref 30.0–36.0)
MCV: 90.8 fL (ref 78.0–100.0)
Platelets: 212 10*3/uL (ref 150–400)
RBC: 4.23 MIL/uL (ref 4.22–5.81)
RDW: 15.3 % (ref 11.5–15.5)
WBC: 7.2 10*3/uL (ref 4.0–10.5)

## 2011-09-25 LAB — BASIC METABOLIC PANEL
BUN: 31 mg/dL — ABNORMAL HIGH (ref 6–23)
CO2: 28 mEq/L (ref 19–32)
Calcium: 9.1 mg/dL (ref 8.4–10.5)
Chloride: 98 mEq/L (ref 96–112)
Creatinine, Ser: 1.42 mg/dL — ABNORMAL HIGH (ref 0.50–1.35)
GFR calc Af Amer: 55 mL/min — ABNORMAL LOW (ref >90)
GFR calc non Af Amer: 47 mL/min — ABNORMAL LOW (ref >90)
Glucose, Bld: 133 mg/dL — ABNORMAL HIGH (ref 70–99)
Potassium: 4.5 mEq/L (ref 3.5–5.1)
Sodium: 138 mEq/L (ref 135–145)

## 2011-09-25 LAB — POCT I-STAT TROPONIN I: Troponin i, poc: 0 ng/mL (ref 0.00–0.08)

## 2011-09-25 LAB — PROTIME-INR
INR: 2.72 — ABNORMAL HIGH (ref 0.00–1.49)
Prothrombin Time: 29.3 seconds — ABNORMAL HIGH (ref 11.6–15.2)

## 2011-09-25 MED ORDER — FUROSEMIDE 10 MG/ML IJ SOLN
60.0000 mg | Freq: Once | INTRAMUSCULAR | Status: AC
  Administered 2011-09-25: 60 mg via INTRAVENOUS
  Filled 2011-09-25: qty 6

## 2011-09-25 NOTE — Telephone Encounter (Signed)
I spoke with the pt and he complains of constant nausea, no vomiting, sour taste when belching, weakness, cough and SOB.  The pt states he cannot walk to his car and back without difficulty breathing. The pt said he cannot eat due to nausea. The pt's symptoms started during his recent hospitalization.  The pt states that he did not make Dr [name] aware of his symptoms because he wanted to go home. Over the past week the pt's symptoms have continued to progress and worsen. I made the pt aware that he needs to go to the ER for further evaluation of his symptoms. I spoke with Annice Pih the Triage nurse in the ER and made her aware that the pt will be coming into the ER. I also spoke with Ronie Spies PA with [place] and made her aware that the pt is coming to the ER.

## 2011-09-25 NOTE — ED Provider Notes (Signed)
History     CSN: [id number]  Arrival date & time 09/25/11  4098   First MD Initiated Contact with Patient 09/25/11 2019      Chief Complaint  Patient presents with  . Shortness of Breath    (Consider location/radiation/quality/duration/timing/severity/associated sxs/prior treatment) Patient is a 76 y.o. male presenting with shortness of breath. The history is provided by the patient.  Shortness of Breath  The current episode started more than 1 week ago. The onset was gradual. The problem occurs frequently. The problem has been gradually worsening. The problem is moderate. The symptoms are relieved by rest. The symptoms are aggravated by activity. Associated symptoms include cough and shortness of breath. Pertinent negatives include no chest pain, no chest pressure, no orthopnea, no fever, no rhinorrhea, no sore throat and no wheezing. He has had prior hospitalizations. He has been behaving normally. Urine output has been normal. The last void occurred less than 6 hours ago. There were no sick contacts. Recently, medical care has been given at this facility. Services received include tests performed and medications given.    Past Medical History  Diagnosis Date  . Atrial fibrillation     s/p ablation, on tikosyn  . CAD, ARTERY BYPASS GRAFT     4v CABG w/ redo '06; cath 08/14/11 high grade lesion in OM graft, total occlusion of SVG to RCA with collaterals,  patent  LIMA to LAD,occlusion of SVG to Diagonal, and severe segmental LV systolic dysfunction, EF 20%   . CARDIOMYOPATHY, ISCHEMIC   . Chronic systolic heart failure   . Dysuria   . HYPERLIPIDEMIA-MIXED   . RENAL INSUFFICIENCY   . Mitral regurgitation      s/p MV repair '06  . HTN (hypertension)   . COPD (chronic obstructive pulmonary disease)   . Complication of anesthesia     "liked to went crazy w/the mess they gave me; started w/an  A"  . CHF (congestive heart failure)   . Myocardial infarction 1987; ~ 08/17/11  . Anginal  pain   . Pacemaker 13-Sep-2011    "battery is dead; it doesn't work"  . ICD (implantable cardiac defibrillator) in place 2011-09-13    "battery is dead; it doesn't work"  . Pneumonia     h/o "walking pneumonia; long time ago"  . Shortness of breath 09-13-11    "all the time"  . Type II diabetes mellitus   . Arthritis     "in the neck after neck OR"    Past Surgical History  Procedure Date  . Mitral valve repair     with an angioplasty ring  . Cervical discectomy     C3-C4  . Cardiac defibrillator placement ~ 2007    Guidant  . Coronary artery bypass graft 1995    CABG X 2  . Coronary artery bypass graft 2002    CABG "& fixed hole in back of the heart"  . Coronary angioplasty with stent placement     "2 I think"  . Inguinal hernia repair     bilaterally  . Excisional hemorrhoidectomy   . Anterior fusion cervical spine     "went down in my throat to do it"  . Tee without cardioversion 08/30/2011    Procedure: TRANSESOPHAGEAL ECHOCARDIOGRAM (TEE);  Surgeon: Dolores Patty, MD;  Location: [facility] ENDOSCOPY;  Service: Cardiovascular;  Laterality: N/A;  . Cardioversion 08/30/2011    Procedure: CARDIOVERSION;  Surgeon: Dolores Patty, MD;  Location: [facility] ENDOSCOPY;  Service: Cardiovascular;  Laterality: N/A;  Family History  Problem Relation Age of Onset  . Hypertension Other     History  Substance Use Topics  . Smoking status: Former Smoker -- 1.5 packs/day for 40 years    Types: Cigarettes, Pipe    Quit date: 03/06/1985  . Smokeless tobacco: Former Neurosurgeon    Types: Snuff, Dorna Bloom    Quit date: 03/06/1958   Comment: "quit chewing and dipping in my early 20's"  . Alcohol Use: Yes     08/24/11 "last alcohol 15 years ago or better"      Review of Systems  Constitutional: Negative for fever, activity change, appetite change and fatigue.  HENT: Negative for congestion, sore throat, facial swelling, rhinorrhea, trouble swallowing, neck pain, neck stiffness, voice change and sinus  pressure.   Eyes: Negative.   Respiratory: Positive for cough and shortness of breath. Negative for choking, chest tightness and wheezing.   Cardiovascular: Negative for chest pain and orthopnea.  Gastrointestinal: Negative for nausea, vomiting and abdominal pain.  Genitourinary: Negative for dysuria, urgency, frequency, hematuria, flank pain and difficulty urinating.  Musculoskeletal: Negative for back pain and gait problem.  Skin: Negative for rash and wound.  Neurological: Negative for facial asymmetry, weakness, numbness and headaches.  Psychiatric/Behavioral: Negative for behavioral problems, confusion and agitation. The patient is not nervous/anxious and is not hyperactive.   All other systems reviewed and are negative.    Allergies  Lorazepam and Codeine  Home Medications   Current Outpatient Rx  Name Route Sig Dispense Refill  . ACETAMINOPHEN 500 MG PO TABS Oral Take 500 mg by mouth every 4 (four) hours as needed. For pain    . ASPIRIN 81 MG PO TABS Oral Take 81 mg by mouth daily.     Marland Kitchen FLUTICASONE PROPIONATE 50 MCG/ACT NA SUSP Nasal Place 2 sprays into the nose 4 (four) times daily as needed. For stuffy nose and sinuses    . FUROSEMIDE 40 MG PO TABS Oral Take 20-40 mg by mouth 2 (two) times daily. 1 tab in the morning and 0.5 tab in the evening    . GLIMEPIRIDE 4 MG PO TABS Oral Take 8 mg by mouth daily before breakfast.    . ISOSORBIDE MONONITRATE ER 60 MG PO TB24 Oral Take 60 mg by mouth daily.    Marland Kitchen METFORMIN HCL ER 500 MG PO TB24 Oral Take 500 mg by mouth daily with supper.    Marland Kitchen METOPROLOL TARTRATE 50 MG PO TABS Oral Take 25 mg by mouth 2 (two) times daily.     Marland Kitchen NITROGLYCERIN 0.4 MG SL SUBL Sublingual Place 0.4 mg under the tongue every 5 (five) minutes x 3 doses as needed. For chest pain    . POTASSIUM CHLORIDE CRYS ER 20 MEQ PO TBCR Oral Take 10-20 mEq by mouth 2 (two) times daily. 0.5 tab in the morning and 1 tab in the evening    . TRIAZOLAM 0.25 MG PO TABS Oral Take  0.25 mg by mouth at bedtime.     . WARFARIN SODIUM 5 MG PO TABS Oral Take 5 mg by mouth daily.      BP 120/74  Pulse 75  Temp 98.8 F (37.1 C) (Oral)  Resp 20  SpO2 92%  Physical Exam  Nursing note and vitals reviewed. Constitutional: He is oriented to person, place, and time. He appears well-developed and well-nourished. No distress.  HENT:  Head: Normocephalic and atraumatic.  Right Ear: External ear normal.  Left Ear: External ear normal.  Mouth/Throat: No oropharyngeal  exudate.  Eyes: Conjunctivae and EOM are normal. Pupils are equal, round, and reactive to light. Right eye exhibits no discharge. Left eye exhibits no discharge.  Neck: Normal range of motion. Neck supple. JVD present. No tracheal deviation present. No thyromegaly present.  Cardiovascular: Normal rate, regular rhythm, normal heart sounds and intact distal pulses.  Exam reveals no gallop and no friction rub.   No murmur heard. Pulmonary/Chest: Effort normal. No respiratory distress. He has no wheezes. He has rales (bilateral basilar rales). He exhibits no tenderness.  Abdominal: Soft. Bowel sounds are normal. He exhibits no distension. There is no tenderness. There is no rebound and no guarding.  Musculoskeletal: Normal range of motion. He exhibits edema (1+ pitting edema). He exhibits no tenderness.  Lymphadenopathy:    He has no cervical adenopathy.  Neurological: He is alert and oriented to person, place, and time. No cranial nerve deficit.  Skin: Skin is warm and dry. No rash noted. He is not diaphoretic. No pallor.  Psychiatric: He has a normal mood and affect. His behavior is normal.    ED Course  Procedures (including critical care time)  Labs Reviewed  CBC - Abnormal; Notable for the following:    Hemoglobin 12.7 (*)     HCT 38.4 (*)     All other components within normal limits  PROTIME-INR - Abnormal; Notable for the following:    Prothrombin Time 29.3 (*)     INR 2.72 (*)     All other  components within normal limits  POCT I-STAT TROPONIN I  BASIC METABOLIC PANEL  PRO B NATRIURETIC PEPTIDE   Dg Chest 2 View  09/25/2011  *RADIOLOGY REPORT*  Clinical Data: Short of breath and cough  CHEST - 2 VIEW  Comparison: 08/25/2011  Findings: Mitral valve replacement.  AICD is unchanged.  Cardiac enlargement.  Prior CABG.  Negative for edema.  Negative for pneumonia or effusion.  Mild vascular congestion.  IMPRESSION: Mild vascular congestion without edema or pneumonia.  Original Report Authenticated By: Camelia Phenes, M.D.     No diagnosis found.    MDM  76 year old male patient with past medical history of ischemic cardiomyopathy coronary artery disease congestive heart failure COPD hypertension hyperlipidemia diabetes who recently was hospitalized for a pacemaker implantation presents with shortness of breath. Patient says she's been feeling mildly short of breath since that time it has progressively worsened. Patient with dyspnea on exertion but no chest pain chest pressure abdominal pain neck pain headache nausea vomiting. Patient says she's also been having a cough for many weeks. Patient says sometimes it is coffee gets abdominal discomfort. Patient eating drinking normally with normal stooling and urine output. Physical exam findings significant for JVD Rales bilaterally and pedal edema peripherally. Patient with doubling of BNP in the last month. Concern is patient is having worsening congestive heart failure, patient given 60 mg IV Lasix. ECG shows no features of STEMI, chest x-ray shows some vascular congestion otherwise is normal except a noted BNP above. Given this patient's history and worsening heart failure likely patient has been admitted. Patient admitted to cardiology service for worsening of congestive heart failure.  Results for orders placed during the hospital encounter of 09/25/11  CBC      Component Value Range   WBC 7.2  4.0 - 10.5 K/uL   RBC 4.23  4.22 - 5.81  MIL/uL   Hemoglobin 12.7 (*) 13.0 - 17.0 g/dL   HCT 16.1 (*) 09.6 - 04.5 %   MCV 90.8  78.0 - 100.0 fL   MCH 30.0  26.0 - 34.0 pg   MCHC 33.1  30.0 - 36.0 g/dL   RDW 98.1  19.1 - 47.8 %   Platelets 212  150 - 400 K/uL  BASIC METABOLIC PANEL      Component Value Range   Sodium 138  135 - 145 mEq/L   Potassium 4.5  3.5 - 5.1 mEq/L   Chloride 98  96 - 112 mEq/L   CO2 28  19 - 32 mEq/L   Glucose, Bld 133 (*) 70 - 99 mg/dL   BUN 31 (*) 6 - 23 mg/dL   Creatinine, Ser 2.95 (*) 0.50 - 1.35 mg/dL   Calcium 9.1  8.4 - 62.1 mg/dL   GFR calc non Af Amer 47 (*) >90 mL/min   GFR calc Af Amer 55 (*) >90 mL/min  PRO B NATRIURETIC PEPTIDE      Component Value Range   Pro B Natriuretic peptide (BNP) 4403.0 (*) 0 - 125 pg/mL  PROTIME-INR      Component Value Range   Prothrombin Time 29.3 (*) 11.6 - 15.2 seconds   INR 2.72 (*) 0.00 - 1.49  POCT I-STAT TROPONIN I      Component Value Range   Troponin i, poc 0.00  0.00 - 0.08 ng/mL   Comment 3            DG Chest 2 View (Final result)   Result time:09/25/11 2021    Final result by Rad Results In Interface (09/25/11 20:21:26)    Narrative:   *RADIOLOGY REPORT*  Clinical Data: Short of breath and cough  CHEST - 2 VIEW  Comparison: 08/25/2011  Findings: Mitral valve replacement. AICD is unchanged. Cardiac enlargement. Prior CABG.  Negative for edema. Negative for pneumonia or effusion. Mild vascular congestion.  IMPRESSION: Mild vascular congestion without edema or pneumonia.  Original Report Authenticated By: Camelia Phenes, M.D.    Date: 09/25/2011  Rate: 72  Rhythm: biventricularly paced rhythm  QRS Axis: normal  Intervals: normal  ST/T Wave abnormalities: normal  Conduction Disutrbances:none  Narrative Interpretation:   Old EKG Reviewed: unchanged    Case discussed with Dr. Sharlot Gowda, MD 09/25/11 2330

## 2011-09-25 NOTE — ED Notes (Signed)
Pt reports that had pacemaker replaced last week, since then has been having SOB; reports nausea, has dry cough but reports that has been going on X 3 weeks, denies CP, lungs clear in upper bases, rhonchi in lower bases; pt has +1 lower extremity edema

## 2011-09-25 NOTE — Telephone Encounter (Signed)
Please return call to patient wife [phone]  Patient has persistent cough and nausea, stomach pain

## 2011-09-25 NOTE — H&P (Signed)
Cardiology History and Physical  Park Pope, MD  History of Present Illness (and review of medical records): Corey [name] is a 76 y.o. male who presents for evaluation of shortness of breath.  Of note he has a PMHx significant for CAD (s/p multiple cardiac catheterizations, multiple stent placements, CABG x 4 with re-do and MV repair on 06/2004), ischemic cardiomyopathy, history of atrial fibrillation and VT (s/p BiV ICD implantation 07/2005), history of MR (s/p MV repair 06/2004), type 2 DM, HTN, HL and COPD who was admitted earlier this month with NSTEMI. The LHC revealed severe native coronary artery disease. He has moderate to severe disease of the saphenous vein graft to the acute marginal system. An attempt was made to treat him medically but he had episodes of recurrent severe chest pain with daily activities, prompting re-admission.  CTS consulted was requested but patient was deemed to be not a candidate for redo CABG.  During that hospitalization he was in AFib and underwent TEE/DCCV.  He returned on 7/15 for generator change of AICD as it was at [facility] with no complications.  He reports that since then he has had progressive symptoms of dysnea on exertion, abdominal distentin, and PND.  He has been compliant with meds and dietary restriction.  He reports no change in urine output.  He has increased his lasix dose recently with no improvement in symptoms.  He has also had dry nonproductive cough.  He denies any fevers, chills, night sweats, or chest pain.  Review of Systems Further review of systems was otherwise negative other than stated in HPI.  Patient Active Problem List   Diagnosis Date Noted  . Encounter for long-term (current) use of anticoagulants 09/04/2011  . Anginal chest pain at rest 09/17/11  . CAD (coronary artery disease) 08/14/2011  . NSTEMI (non-ST elevated myocardial infarction) 08/14/2011  . Hypertension 08/14/2011  . Rash 05/01/2011  . Weight loss 01/30/2011    . SINUSITIS, ACUTE 11/16/2009  . Chronic systolic heart failure 08/25/2009  . OTHER SPECIFIED URTICARIA 01/20/2009  . DYSURIA 08/26/2008  . CAD, ARTERY BYPASS GRAFT 07/22/2008  . DIABETES MELLITUS 07/11/2008  . HYPERLIPIDEMIA-MIXED 07/11/2008  . CARDIOMYOPATHY, ISCHEMIC 07/11/2008  . Atrial fibrillation 07/11/2008  . RENAL INSUFFICIENCY 07/11/2008  . ICD - IN SITU 07/11/2008   Past Medical History  Diagnosis Date  . Atrial fibrillation     s/p ablation, on tikosyn  . CAD, ARTERY BYPASS GRAFT     4v CABG w/ redo '06; cath 08/14/11 high grade lesion in OM graft, total occlusion of SVG to RCA with collaterals,  patent  LIMA to LAD,occlusion of SVG to Diagonal, and severe segmental LV systolic dysfunction, EF 20%   . CARDIOMYOPATHY, ISCHEMIC   . Chronic systolic heart failure   . Dysuria   . HYPERLIPIDEMIA-MIXED   . RENAL INSUFFICIENCY   . Mitral regurgitation      s/p MV repair '06  . HTN (hypertension)   . COPD (chronic obstructive pulmonary disease)   . Complication of anesthesia     "liked to went crazy w/the mess they gave me; started w/an  A"  . CHF (congestive heart failure)   . Myocardial infarction 1987; ~ 08/17/11  . Anginal pain   . Pacemaker 2011-09-17    "battery is dead; it doesn't work"  . ICD (implantable cardiac defibrillator) in place 2011-09-17    "battery is dead; it doesn't work"  . Pneumonia     h/o "walking pneumonia; long time ago"  . Shortness  of breath 08/24/11    "all the time"  . Type II diabetes mellitus   . Arthritis     "in the neck after neck OR"    Past Surgical History  Procedure Date  . Mitral valve repair     with an angioplasty ring  . Cervical discectomy     C3-C4  . Cardiac defibrillator placement ~ 2007    Guidant  . Coronary artery bypass graft 1995    CABG X 2  . Coronary artery bypass graft 2002    CABG "& fixed hole in back of the heart"  . Coronary angioplasty with stent placement     "2 I think"  . Inguinal hernia repair      bilaterally  . Excisional hemorrhoidectomy   . Anterior fusion cervical spine     "went down in my throat to do it"  . Tee without cardioversion 08/30/2011    Procedure: TRANSESOPHAGEAL ECHOCARDIOGRAM (TEE);  Surgeon: Dolores Patty, MD;  Location: [facility] ENDOSCOPY;  Service: Cardiovascular;  Laterality: N/A;  . Cardioversion 08/30/2011    Procedure: CARDIOVERSION;  Surgeon: Dolores Patty, MD;  Location: [facility] ENDOSCOPY;  Service: Cardiovascular;  Laterality: N/A;    Current Facility-Administered Medications  Medication Dose Route Frequency Provider Last Rate Last Dose  . furosemide (LASIX) injection 60 mg  60 mg Intravenous Once Sherryl Manges, MD   60 mg at 09/25/11 2131   Current Outpatient Prescriptions  Medication Sig Dispense Refill  . acetaminophen (TYLENOL) 500 MG tablet Take 500 mg by mouth every 4 (four) hours as needed. For pain      . aspirin 81 MG tablet Take 81 mg by mouth daily.       . fluticasone (FLONASE) 50 MCG/ACT nasal spray Place 2 sprays into the nose 4 (four) times daily as needed. For stuffy nose and sinuses      . furosemide (LASIX) 40 MG tablet Take 20-40 mg by mouth 2 (two) times daily. 1 tab in the morning and 0.5 tab in the evening      . glimepiride (AMARYL) 4 MG tablet Take 8 mg by mouth daily before breakfast.      . isosorbide mononitrate (IMDUR) 60 MG 24 hr tablet Take 60 mg by mouth daily.      . metFORMIN (GLUCOPHAGE-XR) 500 MG 24 hr tablet Take 500 mg by mouth daily with supper.      . metoprolol (LOPRESSOR) 50 MG tablet Take 25 mg by mouth 2 (two) times daily.       . nitroGLYCERIN (NITROSTAT) 0.4 MG SL tablet Place 0.4 mg under the tongue every 5 (five) minutes x 3 doses as needed. For chest pain      . potassium chloride SA (K-DUR,KLOR-CON) 20 MEQ tablet Take 10-20 mEq by mouth 2 (two) times daily. 0.5 tab in the morning and 1 tab in the evening      . triazolam (HALCION) 0.25 MG tablet Take 0.25 mg by mouth at bedtime.       Marland Kitchen warfarin (COUMADIN) 5  MG tablet Take 5 mg by mouth daily.        Allergies  Allergen Reactions  . Lorazepam Other (See Comments)    "became unresponsive"  . Codeine Other (See Comments)    "made skin crawl"    History  Substance Use Topics  . Smoking status: Former Smoker -- 1.5 packs/day for 40 years    Types: Cigarettes, Pipe    Quit date: 03/06/1985  . Smokeless  tobacco: Former Neurosurgeon    Types: Snuff, Dorna Bloom    Quit date: 03/06/1958   Comment: "quit chewing and dipping in my early 20's"  . Alcohol Use: Yes     08/24/11 "last alcohol 15 years ago or better"    Family History  Problem Relation Age of Onset  . Hypertension Other      Objective: Patient Vitals for the past 8 hrs:  BP Temp Temp src Pulse Resp SpO2  09/25/11 2348 105/58 mmHg - - 73  23  93 %  09/25/11 2115 111/68 mmHg - - 72  15  91 %  09/25/11 2109 113/73 mmHg 99.9 F (37.7 C) Oral 74  14  94 %  09/25/11 1934 120/74 mmHg 98.8 F (37.1 C) Oral 75  20  92 %   General Appearance:    Alert, cooperative, no distress, appears stated age  Head:    Normocephalic, without obvious abnormality, atraumatic  Eyes:     PERRL, EOMI, anicteric sclerae  Neck:   +JVD  Lungs:     Crackles bilaterally mid to lower base  Heart:    Regular rate and rhythm, S1 and S2 normal, no murmur  Abdomen:     Soft, non-tender, normoactive bowel sounds  Extremities:   1-2+ BLE edema  Pulses:   2+ and symmetric all extremities  Skin:   no rashes or lesions  Neurologic:   No focal deficits. AAO x3   Results for orders placed during the hospital encounter of 09/25/11 (from the past 48 hour(s))  CBC     Status: Abnormal   Collection Time   09/25/11  7:50 PM      Component Value Range Comment   WBC 7.2  4.0 - 10.5 K/uL    RBC 4.23  4.22 - 5.81 MIL/uL    Hemoglobin 12.7 (*) 13.0 - 17.0 g/dL    HCT 16.1 (*) 09.6 - 52.0 %    MCV 90.8  78.0 - 100.0 fL    MCH 30.0  26.0 - 34.0 pg    MCHC 33.1  30.0 - 36.0 g/dL    RDW 04.5  40.9 - 81.1 %    Platelets 212  150 -  400 K/uL   BASIC METABOLIC PANEL     Status: Abnormal   Collection Time   09/25/11  7:50 PM      Component Value Range Comment   Sodium 138  135 - 145 mEq/L    Potassium 4.5  3.5 - 5.1 mEq/L    Chloride 98  96 - 112 mEq/L    CO2 28  19 - 32 mEq/L    Glucose, Bld 133 (*) 70 - 99 mg/dL    BUN 31 (*) 6 - 23 mg/dL    Creatinine, Ser 9.14 (*) 0.50 - 1.35 mg/dL    Calcium 9.1  8.4 - 78.2 mg/dL    GFR calc non Af Amer 47 (*) >90 mL/min    GFR calc Af Amer 55 (*) >90 mL/min   PRO B NATRIURETIC PEPTIDE     Status: Abnormal   Collection Time   09/25/11  7:50 PM      Component Value Range Comment   Pro B Natriuretic peptide (BNP) 4403.0 (*) 0 - 125 pg/mL   PROTIME-INR     Status: Abnormal   Collection Time   09/25/11  7:50 PM      Component Value Range Comment   Prothrombin Time 29.3 (*) 11.6 - 15.2 seconds  INR 2.72 (*) 0.00 - 1.49   POCT I-STAT TROPONIN I     Status: Normal   Collection Time   09/25/11  8:01 PM      Component Value Range Comment   Troponin i, poc 0.00  0.00 - 0.08 ng/mL    Comment 3             Dg Chest 2 View  09/25/2011  *RADIOLOGY REPORT*  Clinical Data: Short of breath and cough  CHEST - 2 VIEW  Comparison: 08/25/2011  Findings: Mitral valve replacement.  AICD is unchanged.  Cardiac enlargement.  Prior CABG.  Negative for edema.  Negative for pneumonia or effusion.  Mild vascular congestion.  IMPRESSION: Mild vascular congestion without edema or pneumonia.  Original Report Authenticated By: Camelia Phenes, M.D.    ECG:  Electronic pacemaker   Assessment: Acute on chronic systolic heart failure Ischemic CMP with recent NSTEMI  Plan:  1. Admit to Cardiology, Telemetry Unit 2. Repeat ekg on admit, prn chest pain or arrythmia 3. Strict I/Os, daily weights, 1.5 liter fluid restriction, 2g Na diet 4. IV lasix 40mg  BID, assess response 5. Medical management to include ASA, BB, Imdur, Coumadin 6. Unclear if not on ACEi/ARB due to history of renal  insufficiency 7. Will hold Metformin, treat with SSI

## 2011-09-25 NOTE — Telephone Encounter (Signed)
error 

## 2011-09-25 NOTE — ED Notes (Signed)
Patient has steri strips to left upper pace maker site. Site is CDI. No reported chest pain.

## 2011-09-26 ENCOUNTER — Inpatient Hospital Stay (HOSPITAL_COMMUNITY): Payer: Medicare Other

## 2011-09-26 DIAGNOSIS — Z95810 Presence of automatic (implantable) cardiac defibrillator: Secondary | ICD-10-CM

## 2011-09-26 DIAGNOSIS — I4891 Unspecified atrial fibrillation: Secondary | ICD-10-CM

## 2011-09-26 DIAGNOSIS — E785 Hyperlipidemia, unspecified: Secondary | ICD-10-CM

## 2011-09-26 DIAGNOSIS — R110 Nausea: Secondary | ICD-10-CM

## 2011-09-26 DIAGNOSIS — I2510 Atherosclerotic heart disease of native coronary artery without angina pectoris: Secondary | ICD-10-CM

## 2011-09-26 DIAGNOSIS — I5023 Acute on chronic systolic (congestive) heart failure: Principal | ICD-10-CM

## 2011-09-26 DIAGNOSIS — R634 Abnormal weight loss: Secondary | ICD-10-CM

## 2011-09-26 LAB — BASIC METABOLIC PANEL
BUN: 28 mg/dL — ABNORMAL HIGH (ref 6–23)
CO2: 27 mEq/L (ref 19–32)
Calcium: 8.7 mg/dL (ref 8.4–10.5)
Chloride: 99 mEq/L (ref 96–112)
Creatinine, Ser: 1.32 mg/dL (ref 0.50–1.35)
GFR calc Af Amer: 60 mL/min — ABNORMAL LOW (ref >90)
GFR calc non Af Amer: 52 mL/min — ABNORMAL LOW (ref >90)
Glucose, Bld: 136 mg/dL — ABNORMAL HIGH (ref 70–99)
Potassium: 3.6 mEq/L (ref 3.5–5.1)
Sodium: 137 mEq/L (ref 135–145)

## 2011-09-26 LAB — PROTIME-INR
INR: 2.59 — ABNORMAL HIGH (ref 0.00–1.49)
Prothrombin Time: 28.2 seconds — ABNORMAL HIGH (ref 11.6–15.2)

## 2011-09-26 LAB — HEPATIC FUNCTION PANEL
AST: 22 U/L (ref 0–37)
Albumin: 2.9 g/dL — ABNORMAL LOW (ref 3.5–5.2)
Bilirubin, Direct: 0.3 mg/dL (ref 0.0–0.3)
Total Bilirubin: 1 mg/dL (ref 0.3–1.2)

## 2011-09-26 LAB — CARDIAC PANEL(CRET KIN+CKTOT+MB+TROPI)
CK, MB: 1.4 ng/mL (ref 0.3–4.0)
Total CK: 50 U/L (ref 7–232)
Troponin I: <0.3 ng/mL (ref <0.30)

## 2011-09-26 LAB — AMYLASE: Amylase: 64 U/L (ref 0–105)

## 2011-09-26 LAB — GLUCOSE, CAPILLARY
Glucose-Capillary: 116 mg/dL — ABNORMAL HIGH (ref 70–99)
Glucose-Capillary: 189 mg/dL — ABNORMAL HIGH (ref 70–99)

## 2011-09-26 LAB — MAGNESIUM: Magnesium: 2.3 mg/dL (ref 1.5–2.5)

## 2011-09-26 LAB — LIPASE, BLOOD: Lipase: 44 U/L (ref 11–59)

## 2011-09-26 MED ORDER — WARFARIN SODIUM 5 MG PO TABS
5.0000 mg | ORAL_TABLET | Freq: Every day | ORAL | Status: DC
  Administered 2011-09-27 – 2011-09-29 (×3): 5 mg via ORAL
  Filled 2011-09-26 (×5): qty 1

## 2011-09-26 MED ORDER — INSULIN ASPART 100 UNIT/ML ~~LOC~~ SOLN
0.0000 [IU] | Freq: Three times a day (TID) | SUBCUTANEOUS | Status: DC
  Administered 2011-09-26: 2 [IU] via SUBCUTANEOUS
  Administered 2011-09-27: 3 [IU] via SUBCUTANEOUS
  Administered 2011-09-28: 2 [IU] via SUBCUTANEOUS
  Administered 2011-09-28: 3 [IU] via SUBCUTANEOUS
  Administered 2011-09-29 (×2): 1 [IU] via SUBCUTANEOUS
  Administered 2011-09-30: 2 [IU] via SUBCUTANEOUS

## 2011-09-26 MED ORDER — NITROGLYCERIN 0.4 MG SL SUBL: 0.4000 mg | SUBLINGUAL_TABLET | SUBLINGUAL | Status: DC | PRN

## 2011-09-26 MED ORDER — ONDANSETRON HCL 4 MG/2ML IJ SOLN
4.0000 mg | Freq: Three times a day (TID) | INTRAMUSCULAR | Status: DC
  Administered 2011-09-26 – 2011-09-30 (×9): 4 mg via INTRAVENOUS
  Filled 2011-09-26 (×9): qty 2

## 2011-09-26 MED ORDER — POLYETHYLENE GLYCOL 3350 17 G PO PACK
17.0000 g | PACK | Freq: Every day | ORAL | Status: DC
  Administered 2011-09-26 – 2011-09-29 (×4): 17 g via ORAL
  Filled 2011-09-26 (×5): qty 1

## 2011-09-26 MED ORDER — SODIUM CHLORIDE 0.9 % IV SOLN: 250.0000 mL | INTRAVENOUS | Status: DC | PRN

## 2011-09-26 MED ORDER — ISOSORBIDE MONONITRATE ER 60 MG PO TB24
60.0000 mg | ORAL_TABLET | Freq: Every day | ORAL | Status: DC
  Administered 2011-09-26 – 2011-09-30 (×5): 60 mg via ORAL
  Filled 2011-09-26 (×5): qty 1

## 2011-09-26 MED ORDER — ONDANSETRON HCL 4 MG/2ML IJ SOLN
4.0000 mg | Freq: Four times a day (QID) | INTRAMUSCULAR | Status: DC | PRN
  Administered 2011-09-27: 4 mg via INTRAVENOUS
  Filled 2011-09-26 (×2): qty 2

## 2011-09-26 MED ORDER — HYDROXYZINE HCL 25 MG PO TABS
25.0000 mg | ORAL_TABLET | Freq: Three times a day (TID) | ORAL | Status: DC | PRN
  Filled 2011-09-26: qty 1

## 2011-09-26 MED ORDER — SODIUM CHLORIDE 0.9 % IJ SOLN: 3.0000 mL | INTRAMUSCULAR | Status: DC | PRN

## 2011-09-26 MED ORDER — SODIUM CHLORIDE 0.9 % IJ SOLN
3.0000 mL | Freq: Two times a day (BID) | INTRAMUSCULAR | Status: DC
  Administered 2011-09-26 – 2011-09-30 (×8): 3 mL via INTRAVENOUS

## 2011-09-26 MED ORDER — ACETAMINOPHEN 325 MG PO TABS: 650.0000 mg | ORAL_TABLET | ORAL | Status: DC | PRN

## 2011-09-26 MED ORDER — METOPROLOL TARTRATE 25 MG PO TABS
25.0000 mg | ORAL_TABLET | Freq: Two times a day (BID) | ORAL | Status: DC
  Administered 2011-09-26 – 2011-09-30 (×8): 25 mg via ORAL
  Filled 2011-09-26 (×11): qty 1

## 2011-09-26 MED ORDER — TRIAZOLAM 0.25 MG PO TABS
0.2500 mg | ORAL_TABLET | Freq: Every day | ORAL | Status: DC
  Administered 2011-09-26 – 2011-09-29 (×4): 0.25 mg via ORAL
  Filled 2011-09-26 (×4): qty 1

## 2011-09-26 MED ORDER — SENNOSIDES-DOCUSATE SODIUM 8.6-50 MG PO TABS
2.0000 | ORAL_TABLET | Freq: Every day | ORAL | Status: DC
  Administered 2011-09-26 – 2011-09-30 (×5): 2 via ORAL
  Filled 2011-09-26 (×5): qty 2

## 2011-09-26 MED ORDER — FLUTICASONE PROPIONATE 50 MCG/ACT NA SUSP
2.0000 | Freq: Four times a day (QID) | NASAL | Status: DC | PRN
  Filled 2011-09-26: qty 16

## 2011-09-26 MED ORDER — FUROSEMIDE 10 MG/ML IJ SOLN
40.0000 mg | Freq: Once | INTRAMUSCULAR | Status: AC
  Administered 2011-09-26: 40 mg via INTRAVENOUS
  Filled 2011-09-26: qty 4

## 2011-09-26 MED ORDER — PANTOPRAZOLE SODIUM 40 MG PO TBEC
40.0000 mg | DELAYED_RELEASE_TABLET | Freq: Every day | ORAL | Status: DC
  Administered 2011-09-26 – 2011-09-30 (×4): 40 mg via ORAL
  Filled 2011-09-26 (×4): qty 1

## 2011-09-26 MED ORDER — WARFARIN SODIUM 5 MG PO TABS
5.0000 mg | ORAL_TABLET | Freq: Every day | ORAL | Status: DC
  Administered 2011-09-26: 5 mg via ORAL
  Filled 2011-09-26: qty 1

## 2011-09-26 MED ORDER — WARFARIN - PHYSICIAN DOSING INPATIENT: Freq: Every day | Status: DC

## 2011-09-26 MED ORDER — ASPIRIN 81 MG PO CHEW
81.0000 mg | CHEWABLE_TABLET | Freq: Every day | ORAL | Status: DC
  Administered 2011-09-26 – 2011-09-30 (×5): 81 mg via ORAL
  Filled 2011-09-26 (×7): qty 1

## 2011-09-26 MED ORDER — GLIMEPIRIDE 4 MG PO TABS
8.0000 mg | ORAL_TABLET | Freq: Every day | ORAL | Status: DC
  Administered 2011-09-28 – 2011-09-30 (×3): 8 mg via ORAL
  Filled 2011-09-26 (×6): qty 2

## 2011-09-26 NOTE — Consult Note (Signed)
[facility] Medical Consultation  Corey [name] AVW:[id number] DOB: 01/11/1936 DOA: 09/25/2011 PCP: Park Pope, MD   Requesting physician: Dr. Johney Frame, Cardiology Date of consultation: 09/26/2011 Reason for consultation: Nausea, Anorexia  Impression/Recommendations Active Problems:  DIABETES MELLITUS  CARDIOMYOPATHY, ISCHEMIC  Atrial fibrillation  Chronic systolic heart failure  RENAL INSUFFICIENCY  ICD - IN SITU  CAD (coronary artery disease)  Nausea  Acute on chronic systolic heart failure   Corey [name] is an extremely pleasant gentleman experiencing severe nausea and anorexia over the past month that seemed to begin shortly after his MI.  We believe a combination of factors may be contributing to his symptoms.  Change in medications.  The patient was unable to tolerate Amiodarone, Tikosyn, and statins.  Has also had difficulty with metformin.  These meds appear to have been discontinued in the hospital.    Acid Reflux.  Agree with protonix started by Primary team.   Irregular bowel habits. likely constipation.  Will check a 2v abd and start daily senna - s  Possible gastroparesis secondary to uncontrolled diabetes.  Will check a gastric emptying scan.  If positive we will recommend starting Reglan 5 mg QID before meals and at bed time.  Pulmonary Hypertension.  Pulm Artery Pressure at 63 mmhg.  Primary team diuresing patient.  I will followup again tomorrow. Please contact me if I can be of assistance in the meanwhile. Thank you for this consultation. Algis Downs, PA-C [facility] Pager: [phone]   Chief Complaint: Nausea, shortness of breath, cough  HPI: Corey [name] is a pleasant gentleman with history of DM, CAD, recent MI, and recent ICD battery change, [place]  He reports since his MI he has developed severe nausea.  He does not vomit but is unable to eat much of anything other than grits.  He awakes in the morning with nausea and  reports that when he tries to walk to the barn to care for his animals he is unable to do so because of coughing, SOB, and severe nausea.  His symptoms have become so severe he spends most of his days in bed, and reports that he has relief only when he sleeps.  He also breaks into tears and tells me he needs a nerve pill.  Corey [name] reports that his bowel movements have changed.  Over the past month he will go 3 - 7 days without a bowel movement, then take 2 ex lax to defecate.  We will check an xray and start him on a daily bowel regimen.  Further Corey [name] mentions that he has been a diabetic for 20 years.  His cbgs at home are 72 - 92, but in the hospital they seem to be much higher.  We would like to check a gastric emptying scan for possible gastroparesis.  He does not seem to tolerate metformin well and has been on several different diabetic medication combinations recently.  He seems to be sensitive to medications and has had difficulty with statins, metformin, and antiarrythmics in the past.  These appear to have been discontinued.  We will offer Corey [name] hydroxyzine TID PRN for anxiety as he has an allergy to lorazepam.   Will also request a physical therapy evaluation for deconditioning and a diabetic coordinator consultation to tune up his cbg control.  . Review of Systems:  Positive for nausea, lower abdominal pain, constipation, gas, borborygmi, anorexia, SOB, non productive cough, decreased energy level and exercise tolerance.  All other  systems reviewed and found to be negative.  Past Medical History  Diagnosis Date  . Atrial fibrillation     s/p ablation, on tikosyn  . CAD, ARTERY BYPASS GRAFT     4v CABG w/ redo '06; cath 08/14/11 high grade lesion in OM graft, total occlusion of SVG to RCA with collaterals,  patent  LIMA to LAD,occlusion of SVG to Diagonal, and severe segmental LV systolic dysfunction, EF 20%   . CARDIOMYOPATHY, ISCHEMIC   . Chronic systolic heart failure     . Dysuria   . HYPERLIPIDEMIA-MIXED   . RENAL INSUFFICIENCY   . Mitral regurgitation      s/p MV repair '06  . HTN (hypertension)   . COPD (chronic obstructive pulmonary disease)   . Complication of anesthesia     "liked to went crazy w/the mess they gave me; started w/an  A"  . CHF (congestive heart failure)   . Myocardial infarction 1987; ~ 08/17/11  . Anginal pain   . Pacemaker Sep 16, 2011    "battery is dead; it doesn't work"  . ICD (implantable cardiac defibrillator) in place 09-16-2011    "battery is dead; it doesn't work"  . Pneumonia     h/o "walking pneumonia; long time ago"  . Shortness of breath 2011-09-16    "all the time"  . Type II diabetes mellitus   . Arthritis     "in the neck after neck OR"   Past Surgical History  Procedure Date  . Mitral valve repair     with an angioplasty ring  . Cervical discectomy     C3-C4  . Cardiac defibrillator placement ~ 2007    Guidant  . Coronary artery bypass graft 1995    CABG X 2  . Coronary artery bypass graft 2002    CABG "& fixed hole in back of the heart"  . Coronary angioplasty with stent placement     "2 I think"  . Inguinal hernia repair     bilaterally  . Excisional hemorrhoidectomy   . Anterior fusion cervical spine     "went down in my throat to do it"  . Tee without cardioversion 08/30/2011    Procedure: TRANSESOPHAGEAL ECHOCARDIOGRAM (TEE);  Surgeon: Dolores Patty, MD;  Location: [facility] ENDOSCOPY;  Service: Cardiovascular;  Laterality: N/A;  . Cardioversion 08/30/2011    Procedure: CARDIOVERSION;  Surgeon: Dolores Patty, MD;  Location: [facility] ENDOSCOPY;  Service: Cardiovascular;  Laterality: N/A;   Social History:  reports that he quit smoking about 26 years ago. His smoking use included Cigarettes and Pipe. He has a 60 pack-year smoking history. He quit smokeless tobacco use about 53 years ago. His smokeless tobacco use included Snuff and Chew. He reports that he drinks alcohol. He reports that he does not use  illicit drugs.  Allergies  Allergen Reactions  . Lorazepam Other (See Comments)    "became unresponsive"  . Codeine Other (See Comments)    "made skin crawl"   Family History  Problem Relation Age of Onset  . Hypertension Other     Prior to Admission medications   Medication Sig Start Date End Date Taking? Authorizing Provider  acetaminophen (TYLENOL) 500 MG tablet Take 500 mg by mouth every 4 (four) hours as needed. For pain   Yes Historical Provider, MD  aspirin 81 MG tablet Take 81 mg by mouth daily.    Yes Historical Provider, MD  fluticasone (FLONASE) 50 MCG/ACT nasal spray Place 2 sprays into the nose 4 (four)  times daily as needed. For stuffy nose and sinuses 03/21/11  Yes Historical Provider, MD  furosemide (LASIX) 40 MG tablet Take 20-40 mg by mouth 2 (two) times daily. 1 tab in the morning and 0.5 tab in the evening   Yes Historical Provider, MD  glimepiride (AMARYL) 4 MG tablet Take 8 mg by mouth daily before breakfast.   Yes Historical Provider, MD  isosorbide mononitrate (IMDUR) 60 MG 24 hr tablet Take 60 mg by mouth daily. 08/20/11  Yes Jessica A Hope, PA-C  metFORMIN (GLUCOPHAGE-XR) 500 MG 24 hr tablet Take 500 mg by mouth daily with supper.   Yes Historical Provider, MD  metoprolol (LOPRESSOR) 50 MG tablet Take 25 mg by mouth 2 (two) times daily.    Yes Historical Provider, MD  nitroGLYCERIN (NITROSTAT) 0.4 MG SL tablet Place 0.4 mg under the tongue every 5 (five) minutes x 3 doses as needed. For chest pain   Yes Historical Provider, MD  potassium chloride SA (K-DUR,KLOR-CON) 20 MEQ tablet Take 10-20 mEq by mouth 2 (two) times daily. 0.5 tab in the morning and 1 tab in the evening   Yes Historical Provider, MD  triazolam (HALCION) 0.25 MG tablet Take 0.25 mg by mouth at bedtime.    Yes Historical Provider, MD  warfarin (COUMADIN) 5 MG tablet Take 5 mg by mouth daily.   Yes Historical Provider, MD   Physical Exam: Blood pressure 110/72, pulse 75, temperature 98.7 F (37.1  C), temperature source Oral, resp. rate 18, height 5\' 9"  (1.753 m), weight 82.4 kg (181 lb 10.5 oz), SpO2 96.00%. Filed Vitals:   09/26/11 0127 09/26/11 0303 09/26/11 0623 09/26/11 0957  BP: 116/68 110/68 103/66 110/72  Pulse: 72 70 65 75  Temp: 98.6 F (37 C)  97.5 F (36.4 C) 98.7 F (37.1 C)  TempSrc: Oral  Oral Oral  Resp: 20  19 18   Height: 5\' 9"  (1.753 m)     Weight: 82.4 kg (181 lb 10.5 oz)     SpO2: 96%  92% 96%     General:  A&O, appears fatigued and weak  Neck: supple, no thyromegaly  Cardiovascular: RRR no M/R/G  Respiratory: CTA no W/C/R  Abdomen: Soft, Active BS, No masses. NT  Psychiatric: Patient becomes tearful, Cooperative, Pleasant, Well Groomed.  Seems depressed over health  Neurologic: CN 2-12 grossly intact, non focal.  Labs on Admission:  Basic Metabolic Panel:  Lab 09/26/11 1610 09/25/11 1950  NA 137 138  K 3.6 4.5  CL 99 98  CO2 27 28  GLUCOSE 136* 133*  BUN 28* 31*  CREATININE 1.32 1.42*  CALCIUM 8.7 9.1  MG 2.3 --  PHOS -- --   CBC:  Lab 09/25/11 1950  WBC 7.2  NEUTROABS --  HGB 12.7*  HCT 38.4*  MCV 90.8  PLT 212   Cardiac Enzymes:  Lab 09/26/11 1025  CKTOTAL 50  CKMB 1.4  CKMBINDEX --  TROPONINI <0.30   CBG:  Lab 09/26/11 0612 09/26/11 0124  GLUCAP 116* 118*    Radiological Exams on Admission: Dg Chest 2 View  09/25/2011  *RADIOLOGY REPORT*  Clinical Data: Short of breath and cough  CHEST - 2 VIEW  Comparison: 08/25/2011  Findings: Mitral valve replacement.  AICD is unchanged.  Cardiac enlargement.  Prior CABG.  Negative for edema.  Negative for pneumonia or effusion.  Mild vascular congestion.  IMPRESSION: Mild vascular congestion without edema or pneumonia.  Original Report Authenticated By: Camelia Phenes, M.D.    Time spent:  45 min.  [place] [facility] Pager [phone]  If 7PM-7AM, please contact night-coverage [email or web address] Password [facility] 09/26/2011, 11:36 AM       Patient  seen and examined, agree with above note. Patient still has nausea, multifactorial Will check Xray abdomen Treat constipation If gastroparesis confirmed, start reglan 5 mg po QID

## 2011-09-26 NOTE — Progress Notes (Signed)
CARDIOLOGY ROUNDING NOTE    Patient Name: Corey [name] Date of Encounter: 09-26-2011    SUBJECTIVE:Patient is no better this morning.  He complains of nausea, anorexia, abdominal distention, persistent non productive cough, and lack of energy. Denies vomiting.  He has been having bowel movements.  He states that his symptoms began earlier this month at the time of his NSTEMI but has worsened significantly since ICD generator change last week.  He denies fevers or chills.  Weight was 197lb 01-2011, down to 181 this admission.  (down 4 lbs since generator change weight last week).   TELEMETRY: Reviewed telemetry pt in sinus rhythm with ventricular pacing   Current Medications: . aspirin  81 mg Oral Daily  . furosemide  40 mg Intravenous Once  . furosemide  60 mg Intravenous Once  . glimepiride  8 mg Oral QAC breakfast  . insulin aspart  0-9 Units Subcutaneous TID WC  . isosorbide mononitrate  60 mg Oral Daily  . metoprolol  25 mg Oral BID  . sodium chloride  3 mL Intravenous Q12H  . triazolam  0.25 mg Oral QHS  . warfarin  5 mg Oral Daily    Filed Vitals:   09/25/11 2348 09/26/11 0127 09/26/11 0303 09/26/11 0623  BP: 105/58 116/68 110/68 103/66  Pulse: 73 72 70 65  Temp:  98.6 F (37 C)  97.5 F (36.4 C)  TempSrc:  Oral  Oral  Resp: 23 20  19   Height:  5\' 9"  (1.753 m)    Weight:  181 lb 10.5 oz (82.4 kg)    SpO2: 93% 96%  92%    Intake/Output Summary (Last 24 hours) at 09/26/11 0834 Last data filed at 09/26/11 0818  Gross per 24 hour  Intake    600 ml  Output   2600 ml  Net  -2000 ml   LABS: Basic Metabolic Panel:  Basename 09/26/11 0511 09/25/11 1950  NA 137 138  K 3.6 4.5  CL 99 98  CO2 27 28  GLUCOSE 136* 133*  BUN 28* 31*  CREATININE 1.32 1.42*  CALCIUM 8.7 9.1  MG 2.3 --  PHOS -- --   CBC:  Basename 09/25/11 1950  WBC 7.2  NEUTROABS --  HGB 12.7*  HCT 38.4*  MCV 90.8  PLT 212   INR 2.59  AST 49; ALT 24 in June 2013  Radiology/Studies:    Dg Chest 2 View 09/25/2011  *RADIOLOGY REPORT*  Clinical Data: Short of breath and cough  CHEST - 2 VIEW  Comparison: 08/25/2011  Findings: Mitral valve replacement.  AICD is unchanged.  Cardiac enlargement.  Prior CABG.  Negative for edema.  Negative for pneumonia or effusion.  Mild vascular congestion.  IMPRESSION: Mild vascular congestion without edema or pneumonia.  Original Report Authenticated By: Camelia Phenes, M.D.   PHYSICAL EXAM Physical Exam: Filed Vitals:   09/25/11 2348 09/26/11 0127 09/26/11 0303 09/26/11 0623  BP: 105/58 116/68 110/68 103/66  Pulse: 73 72 70 65  Temp:  98.6 F (37 C)  97.5 F (36.4 C)  TempSrc:  Oral  Oral  Resp: 23 20  19   Height:  5\' 9"  (1.753 m)    Weight:  181 lb 10.5 oz (82.4 kg)    SpO2: 93% 96%  92%    GEN- The patient is ill appearing, alert and oriented x 3 today.   Head- normocephalic, atraumatic Eyes-  Sclera clear, conjunctiva pink Ears- hearing intact Oropharynx- clear Neck- supple, JVP 10cm Lungs-  Clear to ausculation bilaterally, normal work of breathing Heart- Regular rate and rhythm, no murmurs, rubs or gallops, PMI not laterally displaced GI- soft, NT, ND, + BS, no rebound/ guarding Extremities- no clubbing, cyanosis, or edema MS- no significant deformity or atrophy Skin- no rash or lesion Psych- euthymic mood, full affect Neuro- strength and sensation are intact Left chest with steri-strips intact.    Assessment and Plan:  1. Nausea and anorexia-  Unclear etiology.  Though possibly related to CHF, I am particularly concerned with recent weight loss.  His weight is not elevated when compared to last week. I will obtain LFTs/ amylase/lipase and abdominal ultrasound.  Also add PPI. I will consult the hospitalist for assistance in evaluation.  2. Acute on chronic systolic dysfunction- mild volume overload on exam Continue current diuresis Hold ACE inhibitor/ ARB given renal failure  3. Chronic renal failure- stable As  above  4. afib- therapeutic INR  5. CAD- no ischemic symptoms Will check one more set of CMs, though I think that his presentation is unlikely to be due to cad Continue medical management  6. Diabetes- metformin is on hold  7. VT- doing well s/p ICD generator change I will have his device interrogated today.  Fayrene Fearing Malayasia Mirkin,MD

## 2011-09-26 NOTE — Progress Notes (Signed)
Pt arrived to unit via stretcher from ED.  Transferred to bed, tele monitor applied and functioning.  Pt alert and oriented.  VS, weight obtained. Pt denies pain at this time, oriented to room.  No SOB noted.  Has non productive cough at this time.

## 2011-09-26 NOTE — ED Notes (Signed)
Report was given to [place], RN on unit [id number]. Patient is now awaiting transportation to the floor.

## 2011-09-26 NOTE — Progress Notes (Signed)
PT Cancellation Note  Treatment cancelled today due to patient's refusal to participate.  Corey [name] is politely declining walking this afternoon, awaiting a test;   Will reattempt PT eval tomorrow; Thanks,  [name] 09/26/2011, 3:11 PM

## 2011-09-26 NOTE — Progress Notes (Signed)
Received referral on this patient for medication recs.  Patient admitted with CHF flare.  Home diabetes medications include: Amaryl 8 mg daily and Metformin 500 mg daily.    Patient started on Amaryl 8 mg daily along with Novolog Sensitive SSI today.  CBGs stable so far today:   Results for AC, COLAN (MRN [id number]) as of 09/26/2011 16:38  Ref. Range 09/26/2011 01:24 09/26/2011 06:12 09/26/2011 11:10  Glucose-Capillary Latest Range: 70-99 mg/dL 409 (H) 811 (H) 914 (H)   Will continue to follow and assist as needed.  Ambrose Finland RN, MSN, CDE Diabetes Coordinator Inpatient Diabetes Program [phone]

## 2011-09-27 ENCOUNTER — Inpatient Hospital Stay (HOSPITAL_COMMUNITY): Payer: Medicare Other

## 2011-09-27 LAB — BASIC METABOLIC PANEL
BUN: 25 mg/dL — ABNORMAL HIGH (ref 6–23)
CO2: 30 mEq/L (ref 19–32)
Calcium: 8.5 mg/dL (ref 8.4–10.5)
Chloride: 99 mEq/L (ref 96–112)
Creatinine, Ser: 1.34 mg/dL (ref 0.50–1.35)
GFR calc Af Amer: 59 mL/min — ABNORMAL LOW (ref >90)
GFR calc non Af Amer: 51 mL/min — ABNORMAL LOW (ref >90)
Glucose, Bld: 111 mg/dL — ABNORMAL HIGH (ref 70–99)
Potassium: 4.3 mEq/L (ref 3.5–5.1)
Sodium: 138 mEq/L (ref 135–145)

## 2011-09-27 LAB — GLUCOSE, CAPILLARY
Glucose-Capillary: 122 mg/dL — ABNORMAL HIGH (ref 70–99)
Glucose-Capillary: 203 mg/dL — ABNORMAL HIGH (ref 70–99)

## 2011-09-27 MED ORDER — TECHNETIUM TC 99M SULFUR COLLOID
2.0000 | Freq: Once | INTRAVENOUS | Status: AC | PRN
  Administered 2011-09-27: 2 via INTRAVENOUS

## 2011-09-27 MED ORDER — SIMETHICONE 80 MG PO CHEW
160.0000 mg | CHEWABLE_TABLET | Freq: Four times a day (QID) | ORAL | Status: DC
  Administered 2011-09-27 – 2011-09-29 (×10): 160 mg via ORAL
  Filled 2011-09-27 (×15): qty 2

## 2011-09-27 NOTE — Consult Note (Signed)
[facility] Medical Consultation  Corey [name] UJW:[id number] DOB: 24-Apr-1935 DOA: 09/25/2011 PCP: Park Pope, MD   Requesting physician: Dr. Johney Frame, Cardiology Date of consultation: 09/26/2011 Reason for consultation: Nausea, Anorexia  Impression/Recommendations Active Problems:  DIABETES MELLITUS  CARDIOMYOPATHY, ISCHEMIC  Atrial fibrillation  Chronic systolic heart failure  RENAL INSUFFICIENCY  ICD - IN SITU  CAD (coronary artery disease)  Nausea  Acute on chronic systolic heart failure   Corey [name] is an extremely pleasant gentleman experiencing severe nausea and anorexia over the past month that seemed to begin shortly after his MI.  We believe a combination of factors may be contributing to his symptoms.  Change in medications.  The patient was unable to tolerate Amiodarone, Tikosyn, and statins.  Has also had difficulty with metformin.  These meds appear to have been discontinued in the hospital.    Acid Reflux.  Agree with protonix started by Primary team.   Irregular bowel habits. likely constipation.  Will check a 2v abd and start daily senna - s  Possible gastroparesis secondary to uncontrolled diabetes.  Will check a gastric emptying scan.  If positive we will recommend starting Reglan 5 mg QID before meals and at bed time.  Pulmonary Hypertension.  Pulm Artery Pressure at 63 mmhg.  Primary team diuresing patient.  I will followup again tomorrow. Please contact me if I can be of assistance in the meanwhile. Thank you for this consultation.    Chief Complaint: Nausea, shortness of breath, cough  HPI: Corey [name] is a pleasant gentleman with history of DM, CAD, recent MI, and recent ICD battery change, [place]  He reports since his MI he has developed severe nausea.  He does not vomit but is unable to eat much of anything other than grits.  He awakes in the morning with nausea and reports that when he tries to walk to the barn to care for  his animals he is unable to do so because of coughing, SOB, and severe nausea.  His symptoms have become so severe he spends most of his days in bed, and reports that he has relief only when he sleeps.  He also breaks into tears and tells me he needs a nerve pill.  Corey [name] reports that his bowel movements have changed.  Over the past month he will go 3 - 7 days without a bowel movement, then take 2 ex lax to defecate.  We will check an xray and start him on a daily bowel regimen.  Further Corey [name] mentions that he has been a diabetic for 20 years.  His cbgs at home are 72 - 92, but in the hospital they seem to be much higher.  We would like to check a gastric emptying scan for possible gastroparesis.  He does not seem to tolerate metformin well and has been on several different diabetic medication combinations recently.  He seems to be sensitive to medications and has had difficulty with statins, metformin, and antiarrythmics in the past.  These appear to have been discontinued.  We will offer Corey [name] hydroxyzine TID PRN for anxiety as he has an allergy to lorazepam.   Will also request a physical therapy evaluation for deconditioning and a diabetic coordinator consultation to tune up his cbg control.  . Review of Systems:  Positive for nausea, lower abdominal pain, constipation, gas, borborygmi, anorexia, SOB, non productive cough, decreased energy level and exercise tolerance.  All other systems reviewed and found to be  negative.  Past Medical History  Diagnosis Date  . Atrial fibrillation     s/p ablation, on tikosyn  . CAD, ARTERY BYPASS GRAFT     4v CABG w/ redo '06; cath 08/14/11 high grade lesion in OM graft, total occlusion of SVG to RCA with collaterals,  patent  LIMA to LAD,occlusion of SVG to Diagonal, and severe segmental LV systolic dysfunction, EF 20%   . CARDIOMYOPATHY, ISCHEMIC   . Chronic systolic heart failure   . Dysuria   . HYPERLIPIDEMIA-MIXED   . RENAL  INSUFFICIENCY   . Mitral regurgitation      s/p MV repair '06  . HTN (hypertension)   . COPD (chronic obstructive pulmonary disease)   . Complication of anesthesia     "liked to went crazy w/the mess they gave me; started w/an  A"  . CHF (congestive heart failure)   . Myocardial infarction 1987; ~ 08/17/11  . Anginal pain   . Pacemaker Sep 03, 2011    "battery is dead; it doesn't work"  . ICD (implantable cardiac defibrillator) in place 2011-09-03    "battery is dead; it doesn't work"  . Pneumonia     h/o "walking pneumonia; long time ago"  . Shortness of breath 2011/09/03    "all the time"  . Type II diabetes mellitus   . Arthritis     "in the neck after neck OR"   Past Surgical History  Procedure Date  . Mitral valve repair     with an angioplasty ring  . Cervical discectomy     C3-C4  . Cardiac defibrillator placement ~ 2007    [employer]  . Coronary artery bypass graft 1995    CABG X 2  . Coronary artery bypass graft 2002    CABG "& fixed hole in back of the heart"  . Coronary angioplasty with stent placement     "2 I think"  . Inguinal hernia repair     bilaterally  . Excisional hemorrhoidectomy   . Anterior fusion cervical spine     "went down in my throat to do it"  . Tee without cardioversion 08/30/2011    Procedure: TRANSESOPHAGEAL ECHOCARDIOGRAM (TEE);  Surgeon: Dolores Patty, MD;  Location: [facility] ENDOSCOPY;  Service: Cardiovascular;  Laterality: N/A;  . Cardioversion 08/30/2011    Procedure: CARDIOVERSION;  Surgeon: Dolores Patty, MD;  Location: [facility] ENDOSCOPY;  Service: Cardiovascular;  Laterality: N/A;   Social History:  reports that he quit smoking about 26 years ago. His smoking use included Cigarettes and Pipe. He has a 60 pack-year smoking history. He quit smokeless tobacco use about 53 years ago. His smokeless tobacco use included Snuff and Chew. He reports that he drinks alcohol. He reports that he does not use illicit drugs.  Allergies  Allergen Reactions  .  Lorazepam Other (See Comments)    "became unresponsive"  . Codeine Other (See Comments)    "made skin crawl"   Family History  Problem Relation Age of Onset  . Hypertension Other     Prior to Admission medications   Medication Sig Start Date End Date Taking? Authorizing Provider  acetaminophen (TYLENOL) 500 MG tablet Take 500 mg by mouth every 4 (four) hours as needed. For pain   Yes Historical Provider, MD  aspirin 81 MG tablet Take 81 mg by mouth daily.    Yes Historical Provider, MD  fluticasone (FLONASE) 50 MCG/ACT nasal spray Place 2 sprays into the nose 4 (four) times daily as needed. For stuffy nose  and sinuses 03/21/11  Yes Historical Provider, MD  furosemide (LASIX) 40 MG tablet Take 20-40 mg by mouth 2 (two) times daily. 1 tab in the morning and 0.5 tab in the evening   Yes Historical Provider, MD  glimepiride (AMARYL) 4 MG tablet Take 8 mg by mouth daily before breakfast.   Yes Historical Provider, MD  isosorbide mononitrate (IMDUR) 60 MG 24 hr tablet Take 60 mg by mouth daily. 08/20/11  Yes Jessica A Hope, PA-C  metFORMIN (GLUCOPHAGE-XR) 500 MG 24 hr tablet Take 500 mg by mouth daily with supper.   Yes Historical Provider, MD  metoprolol (LOPRESSOR) 50 MG tablet Take 25 mg by mouth 2 (two) times daily.    Yes Historical Provider, MD  nitroGLYCERIN (NITROSTAT) 0.4 MG SL tablet Place 0.4 mg under the tongue every 5 (five) minutes x 3 doses as needed. For chest pain   Yes Historical Provider, MD  potassium chloride SA (K-DUR,KLOR-CON) 20 MEQ tablet Take 10-20 mEq by mouth 2 (two) times daily. 0.5 tab in the morning and 1 tab in the evening   Yes Historical Provider, MD  triazolam (HALCION) 0.25 MG tablet Take 0.25 mg by mouth at bedtime.    Yes Historical Provider, MD  warfarin (COUMADIN) 5 MG tablet Take 5 mg by mouth daily.   Yes Historical Provider, MD   Physical Exam: Blood pressure 105/66, pulse 62, temperature 98 F (36.7 C), temperature source Oral, resp. rate 18, height 5'  9" (1.753 m), weight 82.4 kg (181 lb 10.5 oz), SpO2 96.00%. Filed Vitals:   09/26/11 1500 09/26/11 1811 09/26/11 2114 09/27/11 0800  BP: 111/70 100/56 96/62 105/66  Pulse: 74 64 59 62  Temp: 98 F (36.7 C)  98.6 F (37 C) 98 F (36.7 C)  TempSrc: Oral  Oral Oral  Resp: 18 18 18 18   Height:      Weight:      SpO2: 96% 96% 97% 96%     General:  A&O, appears fatigued and weak  Neck: supple, no thyromegaly  Cardiovascular: RRR no M/R/G  Respiratory: CTA no W/C/R  Abdomen: Soft, Active BS, No masses. NT  Psychiatric: Cooperative, Pleasant, Well Groomed.    Neurologic: CN 2-12 grossly intact, non focal.  Labs on Admission:  Basic Metabolic Panel:  Lab 09/27/11 4098 09/26/11 0511 09/25/11 1950  NA 138 137 138  K 4.3 3.6 4.5  CL 99 99 98  CO2 30 27 28   GLUCOSE 111* 136* 133*  BUN 25* 28* 31*  CREATININE 1.34 1.32 1.42*  CALCIUM 8.5 8.7 9.1  MG -- 2.3 --  PHOS -- -- --   CBC:  Lab 09/25/11 1950  WBC 7.2  NEUTROABS --  HGB 12.7*  HCT 38.4*  MCV 90.8  PLT 212   Cardiac Enzymes:  Lab 09/26/11 1025  CKTOTAL 50  CKMB 1.4  CKMBINDEX --  TROPONINI <0.30   CBG:  Lab 09/27/11 1124 09/27/11 0610 09/26/11 2106 09/26/11 1653 09/26/11 1110  GLUCAP 122* 102* 206* 89 189*    Radiological Exams on Admission: Dg Chest 2 View  09/25/2011  *RADIOLOGY REPORT*  Clinical Data: Short of breath and cough  CHEST - 2 VIEW  Comparison: 08/25/2011  Findings: Mitral valve replacement.  AICD is unchanged.  Cardiac enlargement.  Prior CABG.  Negative for edema.  Negative for pneumonia or effusion.  Mild vascular congestion.  IMPRESSION: Mild vascular congestion without edema or pneumonia.  Original Report Authenticated By: Camelia Phenes, M.D.   US Abdomen Complete  09/26/2011  *RADIOLOGY REPORT*  Clinical Data:  Nausea  COMPLETE ABDOMINAL ULTRASOUND  Comparison:  None.  Findings:  Gallbladder:  No gallstones, gallbladder wall thickening, or pericholecystic fluid.  Common bile  duct:  4.2 mm  Liver:  No focal lesion identified.  Within normal limits in parenchymal echogenicity.  IVC:  Appears normal.  Pancreas:  No focal abnormality seen.  Spleen:  4.9 cm  Right Kidney:  12.2 cm.  Negative for hydronephrosis.  2.6 x 3.6 cm cyst in the mid pole  Left Kidney:  12.0 cm.  Normal  Abdominal aorta:  No aneurysm identified.  IMPRESSION: Left renal cyst.  Otherwise negative.  Negative for gallstones.  Original Report Authenticated By: Camelia Phenes, M.D.   Dg Abd 2 Views  09/26/2011  *RADIOLOGY REPORT*  Clinical Data: Abdominal pain and nausea  ABDOMEN - 2 VIEW  Comparison: 07/13/2004  Findings: Nondilated large and small bowel.  Negative for bowel obstruction.  Negative for pneumoperitoneum.  Gas is present in the rectum.  IMPRESSION: No acute abnormality in the abdomen.  Original Report Authenticated By: Camelia Phenes, M.D.    Time spent: 35 min.  Kathlen Mody, MD [facility] Pager [phone]  If 7PM-7AM, please contact night-coverage [email or web address] Password [facility] 09/27/2011, 12:11 PM

## 2011-09-27 NOTE — Progress Notes (Signed)
Patient is being transported to Radiology______________________________________________D. Manson Passey RN

## 2011-09-27 NOTE — Progress Notes (Addendum)
   CARE MANAGEMENT NOTE 09/27/2011  Patient:  Corey [name], Corey [name]   Account Number:  [id number]  Date Initiated:  09/27/2011  Documentation initiated by:  [facility]  Subjective/Objective Assessment:   CHF, DM     Action/Plan:   lives at home alone, has two sons that asisst but they are currently having their own health issues.   Anticipated DC Date:  09/29/2011   Anticipated DC Plan:  HOME W HOME HEALTH SERVICES      DC Planning Services  CM consult      [facility] Choice  HOME HEALTH   Choice offered to / List presented to:  C-1 Patient        HH arranged  HH - 11 Patient Refused      Status of service:  In process, will continue to follow Medicare Important Message given?   (If response is "NO", the following Medicare IM given date fields will be blank) Date Medicare IM given:   Date Additional Medicare IM given:    Discharge Disposition:    Per UR Regulation:    If discussed at Long Length of Stay Meetings, dates discussed:    Comments:  09/27/2011 1520 Spoke to pt and explained the importance HH RN to follow up at home. NCM explained HH RN can help with Disease Mgmt, daily weight, and med mgmt. States he does not weigh daily and rarely has problems with his weight or retaining fluid. 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 Pt states he does not want a HH RN at this time. NCM gave pt [facility] agency list and to give NCM his finally decision at d/c. Pt states he has Living Better with Heart Failure booklet.   Isidoro Donning RN CCM Case Mgmt phone [phone]

## 2011-09-27 NOTE — Clinical Documentation Improvement (Signed)
CKD DOCUMENTATION CLARIFICATION QUERY   THIS DOCUMENT IS NOT A PERMANENT PART OF THE MEDICAL RECORD   Please update your documentation within the medical record to reflect your response to this query.                                                                                         09/27/11   Dr. Johney Frame and/or Associates,  In a better effort to capture your patient's severity of illness, reflect appropriate length of stay and utilization of resources, a review of the patient medical record has revealed the following indicators:  CHL Review back to 04/2011 GFR Range   (wm) 40's - 50's  "RENAL INSUFFICIENCY"  [place] C. Terressa Koyanagi, MD 09/25/11 2228     H&P  "Hold ACE inhibitor/ ARB given renal failure  3. Chronic renal failure- stable" Jarold Song 09/26/11     Progress Note  "Chronic renal failure- improving" Jarold Song 09/27/11     Progress Note     Based on your clinical judgment, please document in the progress notes and discharge summary if a condition below provides greater specificity regarding the patient's chronic renal function:   - CKD Stage I -  GFR > OR = 90  - CKD Stage II - GFR 60-80  - CKD Stage III - GFR 30-59  - CKD Stage IV - GFR 15-29  - CKD Stage V - GFR < 15  - ESRD (End Stage Renal Disease)  - Other Condition  - Unable to Clinically Determine   In responding to this query please exercise your independent judgment.    The fact that a query is asked, does not imply that any particular answer is desired or expected.    Reviewed:  no additional documentation provided  Thank You,  Jerral Ralph  RN BSN CCDS Certified Clinical Documentation Specialist: Cell   [phone]  Health Information Management [place]   TO RESPOND TO THE THIS QUERY, FOLLOW THE INSTRUCTIONS BELOW:  1. If needed, update documentation for the patient's encounter via the notes activity.  2. Access this query again and click edit on the [employer].  3. After updating, or not, click F2 to complete all highlighted (required) fields concerning your review. Select "additional documentation in the medical record" OR "no additional documentation provided".  4. Click Sign note button.  5. The deficiency will fall out of your In Basket *Please let us know if you are not able to complete this workflow by phone or e-mail (listed below).

## 2011-09-27 NOTE — Progress Notes (Signed)
Pt would like to have 2200 meds at 2000. Pt very upset that meds were given at 2230.

## 2011-09-27 NOTE — Progress Notes (Signed)
CARDIOLOGY ROUNDING NOTE    Patient Name: Corey [name] Date of Encounter: 09-26-2011    SUBJECTIVE:Patient is much better this morning.  His nausea has improved and he ate a good dinner last night.  He has still not had a BM.  Cough is improved.  Denies CP or SOB.  TELEMETRY: Reviewed telemetry pt in sinus rhythm with ventricular pacing   Current Medications: . aspirin  81 mg Oral Daily  . furosemide  40 mg Intravenous Once  . furosemide  60 mg Intravenous Once  . glimepiride  8 mg Oral QAC breakfast  . insulin aspart  0-9 Units Subcutaneous TID WC  . isosorbide mononitrate  60 mg Oral Daily  . metoprolol  25 mg Oral BID  . sodium chloride  3 mL Intravenous Q12H  . triazolam  0.25 mg Oral QHS  . warfarin  5 mg Oral Daily    Filed Vitals:   09/26/11 1500 09/26/11 1811 09/26/11 2114 09/27/11 0800  BP: 111/70 100/56 96/62 105/66  Pulse: 74 64 59 62  Temp: 98 F (36.7 C)  98.6 F (37 C) 98 F (36.7 C)  TempSrc: Oral  Oral Oral  Resp: 18 18 18 18   Height:      Weight:      SpO2: 96% 96% 97% 96%    Intake/Output Summary (Last 24 hours) at 09/27/11 1118 Last data filed at 09/27/11 0730  Gross per 24 hour  Intake    717 ml  Output   1200 ml  Net   -483 ml   LABS: Basic Metabolic Panel:  Basename 09/27/11 0645 09/26/11 0511  NA 138 137  K 4.3 3.6  CL 99 99  CO2 30 27  GLUCOSE 111* 136*  BUN 25* 28*  CREATININE 1.34 1.32  CALCIUM 8.5 8.7  MG -- 2.3  PHOS -- --   CBC:  Basename 09/25/11 1950  WBC 7.2  NEUTROABS --  HGB 12.7*  HCT 38.4*  MCV 90.8  PLT 212   Labs reviewed  Radiology/Studies:  Dg Chest 2 View 09/25/2011  *RADIOLOGY REPORT*  Clinical Data: Short of breath and cough  CHEST - 2 VIEW  Comparison: 08/25/2011  Findings: Mitral valve replacement.  AICD is unchanged.  Cardiac enlargement.  Prior CABG.  Negative for edema.  Negative for pneumonia or effusion.  Mild vascular congestion.  IMPRESSION: Mild vascular congestion without edema or  pneumonia.  Original Report Authenticated By: Camelia Phenes, M.D.   PHYSICAL EXAM Physical Exam: Filed Vitals:   09/26/11 1500 09/26/11 1811 09/26/11 2114 09/27/11 0800  BP: 111/70 100/56 96/62 105/66  Pulse: 74 64 59 62  Temp: 98 F (36.7 C)  98.6 F (37 C) 98 F (36.7 C)  TempSrc: Oral  Oral Oral  Resp: 18 18 18 18   Height:      Weight:      SpO2: 96% 96% 97% 96%    GEN- The patient is more comfortable appearing, alert and oriented x 3 today.   Head- normocephalic, atraumatic Eyes-  Sclera clear, conjunctiva pink Ears- hearing intact Oropharynx- clear Neck- supple, JVP 8cm Lungs- Clear to ausculation bilaterally, normal work of breathing Heart- Regular rate and rhythm, no murmurs, rubs or gallops, PMI not laterally displaced GI- soft, NT, ND, + BS, no rebound/ guarding Extremities- no clubbing, cyanosis, or edema MS- no significant deformity or atrophy Skin- no rash or lesion   Assessment and Plan:  1. Nausea and anorexia-  Unclear etiology.  Improving.  Ultrasound  was normal, no ascites.  I suspect that his symptoms are due to constipation/ ileus.  I appreciate IM input. He is going for gastric emptying study today.  Further workup per the hospitalist team.  2. Acute on chronic systolic dysfunction- mild volume overload on exam Continue current diuresis, consider adding ARB (not ace inhibitor with cough) if creatinine remains stable  3. Chronic renal failure- improving As above  4. afib- therapeutic INR  5. CAD- no ischemic symptoms Continue medical management  6. Diabetes- metformin is on hold  7. VT- doing well s/p ICD generator change Normal device function  I anticipate possible discharge tomorrow am if hospitalist workup is complete and he is clinically improved at that point.  Fayrene Fearing Tatayana Beshears,MD

## 2011-09-27 NOTE — Progress Notes (Signed)
Patient has returned to unit from Radiology      D. Manson Passey RN

## 2011-09-27 NOTE — Progress Notes (Signed)
Physical Therapy Evaluation Patient Details Name: Corey [name] MRN: [id number] DOB: 17-Nov-1935 Today's Date: 09/27/2011 Time: 4098-1191 PT Time Calculation (min): 13 min  PT Assessment / Plan / Recommendation Clinical Impression  Pt is 76 yo male with CHF and N/V who came into hospital feeling very weak, having been unable to eat, but is now feeling improved and strength has returned. Pt is independent with mobility and does not need acute PT at this point in time.  PT signing off.  Pt encouraged to continue ambulating in room and halls as he has been doing since admission.    PT Assessment  Patent does not need any further PT services    Follow Up Recommendations  No PT follow up    Barriers to Discharge        Equipment Recommendations  None recommended by PT    Recommendations for Other Services     Frequency      Precautions / Restrictions Precautions Precautions: None Restrictions Weight Bearing Restrictions: No   Pertinent Vitals/Pain No c/o pain      Mobility  Bed Mobility Bed Mobility: Supine to Sit Supine to Sit: 7: Independent Transfers Transfers: Sit to Stand;Stand to Sit Sit to Stand: 7: Independent;From bed Stand to Sit: 7: Independent;To bed Ambulation/Gait Ambulation/Gait Assistance: 7: Independent Ambulation Distance (Feet): 25 Feet (pt had already ambulated >300' this AM) Assistive device: None Gait Pattern: Within Functional Limits Gait velocity: WFL Stairs: No Wheelchair Mobility Wheelchair Mobility: No    Exercises     PT Diagnosis:    PT Problem List:   PT Treatment Interventions:     PT Goals    Visit Information  Last PT Received On: 09/27/11 Assistance Needed: +1    Subjective Data  Subjective: I walked the whole hall this morning Patient Stated 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, 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 go fish at the beach   Prior Functioning  Home Living Lives With: Alone Available Help at Discharge: Neighbor Type of Home:  House Home Access: Stairs to enter Secretary/administrator of Steps: 6 Home Layout: One level Home Adaptive Equipment: None Prior Function Level of Independence: Independent Able to Take Stairs?: Yes Driving: Yes Vocation: 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, goats, and Warden/ranger Communication: No difficulties    Cognition  Overall Cognitive Status: Appears within functional limits for tasks assessed/performed Arousal/Alertness: Awake/alert Orientation Level: Oriented X4 / Intact Behavior During Session: WFL for tasks performed    Extremity/Trunk Assessment Right Upper Extremity Assessment RUE ROM/Strength/Tone: Within functional levels RUE Sensation: WFL - Light Touch RUE Coordination: WFL - gross/fine motor Left Upper Extremity Assessment LUE ROM/Strength/Tone: Within functional levels LUE Sensation: WFL - Light Touch LUE Coordination: WFL - gross/fine motor Right Lower Extremity Assessment RLE ROM/Strength/Tone: Within functional levels RLE Sensation: WFL - Light Touch RLE Coordination: WFL - gross/fine motor Left Lower Extremity Assessment LLE ROM/Strength/Tone: Within functional levels LLE Sensation: WFL - Light Touch LLE Coordination: WFL - gross/fine motor Trunk Assessment Trunk Assessment: Normal   Balance Balance Balance Assessed: Yes Dynamic Standing Balance Dynamic Standing - Balance Support: No upper extremity supported;During functional activity Dynamic Standing - Level of Assistance: 6: Modified independent (Device/Increase time) Dynamic Standing - Balance Activities: Other (comment) (unilateral stance, rhomberg, eyes closed, high knee marching) Dynamic Standing - Comments: pt with increased sway with eyes closed tandem stance but no LOB, pt with ability to hold unilateral stance 3 sec before needing to step down.  Pt with no history of falls.  End of Session PT - End of Session Activity Tolerance: Patient tolerated treatment  well Patient left: in bed;with call bell/phone within reach Nurse Communication: Mobility status  GP    Lyanne Co, PT  Acute Rehab Services  [phone]  Lyanne Co 09/27/2011, 10:16 AM

## 2011-09-28 DIAGNOSIS — E119 Type 2 diabetes mellitus without complications: Secondary | ICD-10-CM

## 2011-09-28 LAB — BASIC METABOLIC PANEL
BUN: 23 mg/dL (ref 6–23)
CO2: 27 mEq/L (ref 19–32)
Calcium: 8.4 mg/dL (ref 8.4–10.5)
Chloride: 99 mEq/L (ref 96–112)
Creatinine, Ser: 1.32 mg/dL (ref 0.50–1.35)
GFR calc Af Amer: 60 mL/min — ABNORMAL LOW (ref >90)
GFR calc non Af Amer: 52 mL/min — ABNORMAL LOW (ref >90)
Glucose, Bld: 102 mg/dL — ABNORMAL HIGH (ref 70–99)
Potassium: 4.4 mEq/L (ref 3.5–5.1)
Sodium: 136 mEq/L (ref 135–145)

## 2011-09-28 LAB — GLUCOSE, CAPILLARY
Glucose-Capillary: 173 mg/dL — ABNORMAL HIGH (ref 70–99)
Glucose-Capillary: 138 mg/dL — ABNORMAL HIGH (ref 70–99)

## 2011-09-28 LAB — PROTIME-INR
INR: 2.5 — ABNORMAL HIGH (ref 0.00–1.49)
Prothrombin Time: 27.4 seconds — ABNORMAL HIGH (ref 11.6–15.2)

## 2011-09-28 MED ORDER — MAGNESIUM HYDROXIDE 400 MG/5ML PO SUSP
15.0000 mL | Freq: Every day | ORAL | Status: DC | PRN
  Administered 2011-09-28 – 2011-09-29 (×2): 15 mL via ORAL
  Filled 2011-09-28 (×2): qty 30

## 2011-09-28 MED ORDER — BISACODYL 5 MG PO TBEC
10.0000 mg | DELAYED_RELEASE_TABLET | Freq: Every day | ORAL | Status: DC | PRN
  Administered 2011-09-28: 10 mg via ORAL
  Filled 2011-09-28: qty 2

## 2011-09-28 NOTE — Progress Notes (Signed)
Patient ID: Corey [name], male   DOB: 06/21/1935, 76 y.o.   MRN: [id number] Subjective:  Nausea, cough and sob are improved.  Objective:  Vital Signs in the last 24 hours: Temp:  [98.3 F (36.8 C)-98.7 F (37.1 C)] 98.7 F (37.1 C) (07/25 0537) Pulse Rate:  [62-71] 64  (07/25 0953) Resp:  [18] 18  (07/25 0537) BP: (87-118)/(47-71) 100/60 mmHg (07/25 0953) SpO2:  [94 %-96 %] 94 % (07/25 0537) Weight:  [181 lb (82.101 kg)] 181 lb (82.101 kg) (07/25 0537)  Intake/Output from previous day: 07/24 0701 - 07/25 0700 In: 3 [I.V.:3] Out: 925 [Urine:925] Intake/Output from this shift: Total I/O In: 343 [P.O.:340; I.V.:3] Out: -   Physical Exam: Well appearing NAD HEENT: Unremarkable Neck:  No JVD, no thyromegally Lungs:  Clear with no wheezes, rales, or rhonchi HEART:  Regular rate rhythm, no murmurs, no rubs, no clicks Abd:  Flat, positive bowel sounds, no organomegally, no rebound, no guarding Ext:  2 plus pulses, no edema, no cyanosis, no clubbing Skin:  No rashes no nodules Neuro:  CN II through XII intact, motor grossly intact  Lab Results:  Basename 09/25/11 1950  WBC 7.2  HGB 12.7*  PLT 212    Basename 09/28/11 0608 09/27/11 0645  NA 136 138  K 4.4 4.3  CL 99 99  CO2 27 30  GLUCOSE 102* 111*  BUN 23 25*  CREATININE 1.32 1.34    Basename 09/26/11 1025  TROPONINI <0.30   Hepatic Function Panel  Basename 09/26/11 1025  PROT 6.8  ALBUMIN 2.9*  AST 22  ALT 20  ALKPHOS 51  BILITOT 1.0  BILIDIR 0.3  IBILI 0.7   No results found for this basename: CHOL in the last 72 hours No results found for this basename: PROTIME in the last 72 hours  Imaging: Nm Gastric Emptying  09/27/2011  *RADIOLOGY REPORT*  Clinical Data:  Early satiety.  Nausea.  NUCLEAR MEDICINE GASTRIC EMPTYING SCAN  Technique:  After oral ingestion of radiolabeled meal, sequential abdominal images were obtained for 120 minutes.  Residual percentage of activity remaining within the stomach was  calculated at 60 and 120 minutes.  Radiopharmaceutical:  2.0 mCi Tc-45m sulfur colloid.  Comparison:  None.  Findings: The patient had 8% retention at 2 hours.  Normal is less than 30% at 2 hours.  IMPRESSION: Normal gastric emptying study.  Original Report Authenticated By: Gwynn Burly, M.D.   US Abdomen Complete  09/26/2011  *RADIOLOGY REPORT*  Clinical Data:  Nausea  COMPLETE ABDOMINAL ULTRASOUND  Comparison:  None.  Findings:  Gallbladder:  No gallstones, gallbladder wall thickening, or pericholecystic fluid.  Common bile duct:  4.2 mm  Liver:  No focal lesion identified.  Within normal limits in parenchymal echogenicity.  IVC:  Appears normal.  Pancreas:  No focal abnormality seen.  Spleen:  4.9 cm  Right Kidney:  12.2 cm.  Negative for hydronephrosis.  2.6 x 3.6 cm cyst in the mid pole  Left Kidney:  12.0 cm.  Normal  Abdominal aorta:  No aneurysm identified.  IMPRESSION: Left renal cyst.  Otherwise negative.  Negative for gallstones.  Original Report Authenticated By: Camelia Phenes, M.D.   Dg Abd 2 Views  09/26/2011  *RADIOLOGY REPORT*  Clinical Data: Abdominal pain and nausea  ABDOMEN - 2 VIEW  Comparison: 07/13/2004  Findings: Nondilated large and small bowel.  Negative for bowel obstruction.  Negative for pneumoperitoneum.  Gas is present in the rectum.  IMPRESSION: No acute  abnormality in the abdomen.  Original Report Authenticated By: Camelia Phenes, M.D.    Cardiac Studies: Tele - NSR with BiV Pacing Assessment/Plan:  1. Nausea and vomiting 2. Constipation 3. Acute/Chronic systolic heart failure Rec: his symptoms have improved. His constipation is still present. He will likely be able to be discharged later today. He takes exlax for constipation chronically and can take at home. Note normal gastric emptying. He will continue his current CHF meds. Hopefully he will remain in NSR. A low sodium diet is recommended.    LOS: 3 days    Buel Ream.D. 09/28/2011, 11:16 AM

## 2011-09-28 NOTE — Progress Notes (Signed)
I cosign for Karen Wall's assessment, med administration, notes, I/O, and care plan education. 

## 2011-09-28 NOTE — Progress Notes (Signed)
UR Completed. Gizell Danser, RN, Nurse Case Manager [phone]     

## 2011-09-28 NOTE — Consult Note (Signed)
[facility] Medical Consultation  Corey [name] ZOX:[id number] DOB: Mar 09, 1935 DOA: 09/25/2011 PCP: Corey Pope, MD   Requesting physician: Corey [name], Cardiology Date of consultation: 09/26/2011 Reason for consultation: Nausea, Anorexia  Impression/Recommendations Active Problems:  DIABETES MELLITUS  CARDIOMYOPATHY, ISCHEMIC  Atrial fibrillation  Chronic systolic heart failure  RENAL INSUFFICIENCY  ICD - IN SITU  CAD (coronary artery disease)  Nausea  Acute on chronic systolic heart failure   Corey [name] is an extremely pleasant gentleman experiencing severe nausea and anorexia over the past month that seemed to begin shortly after his MI.  We believe a combination of factors may be contributing to his symptoms.  Change in medications.  The patient was unable to tolerate Amiodarone, Tikosyn, and statins.  Has also had difficulty with metformin.  These meds appear to have been discontinued in the hospital.    GERD:  Agree with protonix started by Primary team.   Irregular bowel habits: likely constipation, he has'nt had a BM in 7 days, he reports that he usually has a BM every 4 days. His acute abd series does not show any bowel obstruction.  His US of the abdomen is within normal limits.  He reports he takes dulcolax once in 3  To 4 days to move his bowels. Will order MOM and dulcolax as needed.   Possible gastroparesis secondary to uncontrolled diabetes.  GE study is within normal limits. His nausea has improved well. No vomiting so far. He reports that he would like to go home.   Pulmonary Hypertension.  Pulm Artery Pressure at 63 mmhg.  Primary team diuresing patient.  Diabetes mellitus: CBG (last 3)   Basename 09/27/11 2243 09/27/11 1637 09/27/11 1124  GLUCAP 111* 203* 122*   Better controlled.  Resume glimepiride and SSI.   I will follow up again tomorrow. Please contact me if I can be of assistance in the meanwhile. Thank you for this  consultation.      Past Medical History  Diagnosis Date  . Atrial fibrillation     s/p ablation, on tikosyn  . CAD, ARTERY BYPASS GRAFT     4v CABG w/ redo '06; cath 08/14/11 high grade lesion in OM graft, total occlusion of SVG to RCA with collaterals,  patent  LIMA to LAD,occlusion of SVG to Diagonal, and severe segmental LV systolic dysfunction, EF 20%   . CARDIOMYOPATHY, ISCHEMIC   . Chronic systolic heart failure   . Dysuria   . HYPERLIPIDEMIA-MIXED   . RENAL INSUFFICIENCY   . Mitral regurgitation      s/p MV repair '06  . HTN (hypertension)   . COPD (chronic obstructive pulmonary disease)   . Complication of anesthesia     "liked to went crazy w/the mess they gave me; started w/an  A"  . CHF (congestive heart failure)   . Myocardial infarction 1987; ~ 08/17/11  . Anginal pain   . Pacemaker September 06, 2011    "battery is dead; it doesn't work"  . ICD (implantable cardiac defibrillator) in place 09/06/2011    "battery is dead; it doesn't work"  . Pneumonia     h/o "walking pneumonia; long time ago"  . Shortness of breath 2011-09-06    "all the time"  . Type II diabetes mellitus   . Arthritis     "in the neck after neck OR"   Past Surgical History  Procedure Date  . Mitral valve repair     with an angioplasty ring  . Cervical discectomy  C3-C4  . Cardiac defibrillator placement ~ 2007    [employer]  . Coronary artery bypass graft 1995    CABG X 2  . Coronary artery bypass graft 2002    CABG "& fixed hole in back of the heart"  . Coronary angioplasty with stent placement     "2 I think"  . Inguinal hernia repair     bilaterally  . Excisional hemorrhoidectomy   . Anterior fusion cervical spine     "went down in my throat to do it"  . Tee without cardioversion 08/30/2011    Procedure: TRANSESOPHAGEAL ECHOCARDIOGRAM (TEE);  Surgeon: Dolores Patty, MD;  Location: [facility] ENDOSCOPY;  Service: Cardiovascular;  Laterality: N/A;  . Cardioversion 08/30/2011    Procedure:  CARDIOVERSION;  Surgeon: Dolores Patty, MD;  Location[facility] ENDOSCOPY;  Service: Cardiovascular;  Laterality: N/A;   Social History:  reports that he quit smoking about 26 years ago. His smoking use included Cigarettes and Pipe. He has a 60 pack-year smoking history. He quit smokeless tobacco use about 53 years ago. His smokeless tobacco use included Snuff and Chew. He reports that he drinks alcohol. He reports that he does not use illicit drugs.  Allergies  Allergen Reactions  . Lorazepam Other (See Comments)    "became unresponsive"  . Codeine Other (See Comments)    "made skin crawl"   Family History  Problem Relation Age of Onset  . Hypertension Other     Prior to Admission medications   Medication Sig Start Date End Date Taking? Authorizing Provider  acetaminophen (TYLENOL) 500 MG tablet Take 500 mg by mouth every 4 (four) hours as needed. For pain   Yes Historical Provider, MD  aspirin 81 MG tablet Take 81 mg by mouth daily.    Yes Historical Provider, MD  fluticasone (FLONASE) 50 MCG/ACT nasal spray Place 2 sprays into the nose 4 (four) times daily as needed. For stuffy nose and sinuses 03/21/11  Yes Historical Provider, MD  furosemide (LASIX) 40 MG tablet Take 20-40 mg by mouth 2 (two) times daily. 1 tab in the morning and 0.5 tab in the evening   Yes Historical Provider, MD  glimepiride (AMARYL) 4 MG tablet Take 8 mg by mouth daily before breakfast.   Yes Historical Provider, MD  isosorbide mononitrate (IMDUR) 60 MG 24 hr tablet Take 60 mg by mouth daily. 08/20/11  Yes Jessica A Hope, PA-C  metFORMIN (GLUCOPHAGE-XR) 500 MG 24 hr tablet Take 500 mg by mouth daily with supper.   Yes Historical Provider, MD  metoprolol (LOPRESSOR) 50 MG tablet Take 25 mg by mouth 2 (two) times daily.    Yes Historical Provider, MD  nitroGLYCERIN (NITROSTAT) 0.4 MG SL tablet Place 0.4 mg under the tongue every 5 (five) minutes x 3 doses as needed. For chest pain   Yes Historical Provider, MD   potassium chloride SA (K-DUR,KLOR-CON) 20 MEQ tablet Take 10-20 mEq by mouth 2 (two) times daily. 0.5 tab in the morning and 1 tab in the evening   Yes Historical Provider, MD  triazolam (HALCION) 0.25 MG tablet Take 0.25 mg by mouth at bedtime.    Yes Historical Provider, MD  warfarin (COUMADIN) 5 MG tablet Take 5 mg by mouth daily.   Yes Historical Provider, MD   Physical Exam: Blood pressure 102/66, pulse 64, temperature 98.7 F (37.1 C), temperature source Oral, resp. rate 18, height 5\' 9"  (1.753 m), weight 82.101 kg (181 lb), SpO2 94.00%. Filed Vitals:   09/27/11 0800  09/27/11 1605 09/27/11 2100 09/28/11 0537  BP: 105/66 118/71 87/47 102/66  Pulse: 62 71 62 64  Temp: 98 F (36.7 C)  98.3 F (36.8 C) 98.7 F (37.1 C)  TempSrc: Oral  Oral Oral  Resp: 18 18 18 18   Height:      Weight:    82.101 kg (181 lb)  SpO2: 96% 96% 95% 94%     General:  A&O, comfortable.  Neck: supple, no thyromegaly  Cardiovascular: RRR no M/R/G  Respiratory: CTA no W/C/R  Abdomen: Soft, Active BS, No masses. NT  Psychiatric: Cooperative, Pleasant, Well Groomed.    Neurologic: CN 2-12 grossly intact, non focal.  Labs on Admission:  Basic Metabolic Panel:  Lab 09/28/11 1610 09/27/11 0645 09/26/11 0511 09/25/11 1950  NA 136 138 137 138  K 4.4 4.3 3.6 4.5  CL 99 99 99 98  CO2 27 30 27 28   GLUCOSE 102* 111* 136* 133*  BUN 23 25* 28* 31*  CREATININE 1.32 1.34 1.32 1.42*  CALCIUM 8.4 8.5 8.7 9.1  MG -- -- 2.3 --  PHOS -- -- -- --   CBC:  Lab 09/25/11 1950  WBC 7.2  NEUTROABS --  HGB 12.7*  HCT 38.4*  MCV 90.8  PLT 212   Cardiac Enzymes:  Lab 09/26/11 1025  CKTOTAL 50  CKMB 1.4  CKMBINDEX --  TROPONINI <0.30   CBG:  Lab 09/27/11 2243 09/27/11 1637 09/27/11 1124 09/27/11 0610 09/26/11 2106  GLUCAP 111* 203* 122* 102* 206*    Radiological Exams on Admission: Nm Gastric Emptying  09/27/2011  *RADIOLOGY REPORT*  Clinical Data:  Early satiety.  Nausea.  NUCLEAR MEDICINE  GASTRIC EMPTYING SCAN  Technique:  After oral ingestion of radiolabeled meal, sequential abdominal images were obtained for 120 minutes.  Residual percentage of activity remaining within the stomach was calculated at 60 and 120 minutes.  Radiopharmaceutical:  2.0 mCi Tc-90m sulfur colloid.  Comparison:  None.  Findings: The patient had 8% retention at 2 hours.  Normal is less than 30% at 2 hours.  IMPRESSION: Normal gastric emptying study.  Original Report Authenticated By: Gwynn Burly, M.D.   US Abdomen Complete  09/26/2011  *RADIOLOGY REPORT*  Clinical Data:  Nausea  COMPLETE ABDOMINAL ULTRASOUND  Comparison:  None.  Findings:  Gallbladder:  No gallstones, gallbladder wall thickening, or pericholecystic fluid.  Common bile duct:  4.2 mm  Liver:  No focal lesion identified.  Within normal limits in parenchymal echogenicity.  IVC:  Appears normal.  Pancreas:  No focal abnormality seen.  Spleen:  4.9 cm  Right Kidney:  12.2 cm.  Negative for hydronephrosis.  2.6 x 3.6 cm cyst in the mid pole  Left Kidney:  12.0 cm.  Normal  Abdominal aorta:  No aneurysm identified.  IMPRESSION: Left renal cyst.  Otherwise negative.  Negative for gallstones.  Original Report Authenticated By: Camelia Phenes, M.D.   Dg Abd 2 Views  09/26/2011  *RADIOLOGY REPORT*  Clinical Data: Abdominal pain and nausea  ABDOMEN - 2 VIEW  Comparison: 07/13/2004  Findings: Nondilated large and small bowel.  Negative for bowel obstruction.  Negative for pneumoperitoneum.  Gas is present in the rectum.  IMPRESSION: No acute abnormality in the abdomen.  Original Report Authenticated By: Camelia Phenes, M.D.    Time spent: 35 min.  Kathlen Mody, MD [facility] Pager [phone]  If 7PM-7AM, please contact night-coverage [email or web address] Password [facility] 09/28/2011, 9:53 AM

## 2011-09-29 DIAGNOSIS — K5900 Constipation, unspecified: Secondary | ICD-10-CM

## 2011-09-29 DIAGNOSIS — I10 Essential (primary) hypertension: Secondary | ICD-10-CM

## 2011-09-29 DIAGNOSIS — I509 Heart failure, unspecified: Secondary | ICD-10-CM

## 2011-09-29 LAB — GLUCOSE, CAPILLARY
Glucose-Capillary: 139 mg/dL — ABNORMAL HIGH (ref 70–99)
Glucose-Capillary: 185 mg/dL — ABNORMAL HIGH (ref 70–99)

## 2011-09-29 MED ORDER — BISACODYL 10 MG RE SUPP
10.0000 mg | Freq: Once | RECTAL | Status: AC
  Administered 2011-09-29: 10 mg via RECTAL
  Filled 2011-09-29: qty 1

## 2011-09-29 MED ORDER — FLEET ENEMA 7-19 GM/118ML RE ENEM
1.0000 | ENEMA | Freq: Every day | RECTAL | Status: DC | PRN
  Administered 2011-09-29: 1 via RECTAL
  Filled 2011-09-29: qty 1

## 2011-09-29 MED ORDER — MINERAL OIL RE ENEM
1.0000 | ENEMA | Freq: Every day | RECTAL | Status: DC | PRN
  Filled 2011-09-29: qty 1

## 2011-09-29 NOTE — Progress Notes (Signed)
Patient Name: Corey [name] Date of Encounter: 09/29/2011  Active Problems:  DIABETES MELLITUS  CARDIOMYOPATHY, ISCHEMIC  Atrial fibrillation  Chronic systolic heart failure  RENAL INSUFFICIENCY  ICD - IN SITU  CAD (coronary artery disease)  Nausea  Acute on chronic systolic heart failure    SUBJECTIVE: He feels at baseline except for constipation. This has been a problem since hospitalized in June. He requires 2 ExLax every 3-4 days to go. Has not had BM in 8 days. Still able to eat. Not passing much gas.  OBJECTIVE Filed Vitals:   09/28/11 2120 09/28/11 2136 09/29/11 0534 09/29/11 0943  BP: 94/58 93/57 103/55 104/66  Pulse: 64 38 71 66  Temp:  99.5 F (37.5 C) 99.1 F (37.3 C) 98.4 F (36.9 C)  TempSrc:  Oral Oral Oral  Resp:  18 18 18   Height:      Weight:   181 lb 12.8 oz (82.464 kg)   SpO2:  93% 92% 93%    Intake/Output Summary (Last 24 hours) at 09/29/11 1100 Last data filed at 09/29/11 1610  Gross per 24 hour  Intake    803 ml  Output    975 ml  Net   -172 ml   Weight change: 12.8 oz (0.363 kg) Filed Weights   09/26/11 0127 09/28/11 0537 09/29/11 0534  Weight: 181 lb 10.5 oz (82.4 kg) 181 lb (82.101 kg) 181 lb 12.8 oz (82.464 kg)   PHYSICAL EXAM General: Well developed, well nourished, elderly male in no acute distress. Head: Normocephalic, atraumatic.  Neck: Supple without bruits, JVD about 10 cm. Lungs:  Resp regular and unlabored, decreased breath sounds bases with rales and few crackles. Heart: RRR, S1, S2, no S3, S4, 2/6 murmur. Abdomen: Soft, non-tender, non-distended, BS slightly decreased but present.  Extremities: No clubbing, cyanosis, no edema.  Neuro: Alert and oriented X 3. Moves all extremities spontaneously. Psych: Normal affect.  LABS: CBC: Lab Results  Component Value Date   WBC 7.2 09/25/2011   HGB 12.7* 09/25/2011   HCT 38.4* 09/25/2011   MCV 90.8 09/25/2011   PLT 212 09/25/2011    INR: Basename 09/29/11 0645  INR 2.60*    Basic Metabolic Panel: Basename 09/28/11 0608 09/27/11 0645  NA 136 138  K 4.4 4.3  CL 99 99  CO2 27 30  GLUCOSE 102* 111*  BUN 23 25*  CREATININE 1.32 1.34  CALCIUM 8.4 8.5  MG -- --  PHOS -- --   Cardiac Enzymes: Lab Results  Component Value Date   CKTOTAL 50 09/26/2011   CKMB 1.4 09/26/2011   TROPONINI <0.30 09/26/2011    BNP: Pro B Natriuretic peptide (BNP)  Date/Time Value Range Status  09/25/2011  7:50 PM 4403.0* 0 - 125 pg/mL Final  08/23/2011  6:10 PM 2047.0* 0 - 125 pg/mL Final    TELE: A-V, BiV pacing.  Radiology/Studies: Dg Chest 2 View 09/25/2011  *RADIOLOGY REPORT*  Clinical Data: Short of breath and cough  CHEST - 2 VIEW  Comparison: 08/25/2011  Findings: Mitral valve replacement.  AICD is unchanged.  Cardiac enlargement.  Prior CABG.  Negative for edema.  Negative for pneumonia or effusion.  Mild vascular congestion.  IMPRESSION: Mild vascular congestion without edema or pneumonia.  Original Report Authenticated By: Camelia Phenes, M.D.   Nm Gastric Emptying 09/27/2011  *RADIOLOGY REPORT*  Clinical Data:  Early satiety.  Nausea.  NUCLEAR MEDICINE GASTRIC EMPTYING SCAN  Technique:  After oral ingestion of radiolabeled meal, sequential abdominal images were  obtained for 120 minutes.  Residual percentage of activity remaining within the stomach was calculated at 60 and 120 minutes.  Radiopharmaceutical:  2.0 mCi Tc-45m sulfur colloid.  Comparison:  None.  Findings: The patient had 8% retention at 2 hours.  Normal is less than 30% at 2 hours.  IMPRESSION: Normal gastric emptying study.  Original Report Authenticated By: Gwynn Burly, M.D.   US Abdomen Complete 09/26/2011  *RADIOLOGY REPORT*  Clinical Data:  Nausea  COMPLETE ABDOMINAL ULTRASOUND  Comparison:  None.  Findings:  Gallbladder:  No gallstones, gallbladder wall thickening, or pericholecystic fluid.  Common bile duct:  4.2 mm  Liver:  No focal lesion identified.  Within normal limits in parenchymal  echogenicity.  IVC:  Appears normal.  Pancreas:  No focal abnormality seen.  Spleen:  4.9 cm  Right Kidney:  12.2 cm.  Negative for hydronephrosis.  2.6 x 3.6 cm cyst in the mid pole  Left Kidney:  12.0 cm.  Normal  Abdominal aorta:  No aneurysm identified.  IMPRESSION: Left renal cyst.  Otherwise negative.  Negative for gallstones.  Original Report Authenticated By: Camelia Phenes, M.D.   Dg Abd 2 Views 09/26/2011  *RADIOLOGY REPORT*  Clinical Data: Abdominal pain and nausea  ABDOMEN - 2 VIEW  Comparison: 07/13/2004  Findings: Nondilated large and small bowel.  Negative for bowel obstruction.  Negative for pneumoperitoneum.  Gas is present in the rectum.  IMPRESSION: No acute abnormality in the abdomen.  Original Report Authenticated By: Camelia Phenes, M.D.    Current Medications:    . aspirin  81 mg Oral Daily  . glimepiride  8 mg Oral QAC breakfast  . insulin aspart  0-9 Units Subcutaneous TID WC  . isosorbide mononitrate  60 mg Oral Daily  . metoprolol  25 mg Oral BID  . ondansetron (ZOFRAN) IV  4 mg Intravenous TID AC  . pantoprazole  40 mg Oral Q0600  . polyethylene glycol  17 g Oral Daily  . senna-docusate  2 tablet Oral Daily  . simethicone  160 mg Oral QID  . sodium chloride  3 mL Intravenous Q12H  . triazolam  0.25 mg Oral QHS  . warfarin  5 mg Oral q1800  . Warfarin - Physician Dosing Inpatient   Does not apply q1800      ASSESSMENT AND PLAN: 1. Nausea and vomiting  - improved 2. Constipation  - see below 3. Acute/Chronic systolic heart failure  - his weight is lower than it has ever been.  Rec: his CHF symptoms have improved. His constipation is still present. Will order an enema as MOM, Dulcolax and Miralax have not helped. He will likely be able to be discharged later today. He takes exlax for constipation chronically and can take at home. Note normal gastric emptying. He will continue his current CHF meds. Hopefully he will remain in NSR. A low sodium diet is recommended.  MD please review meds and advise if any are the likely cause of the constipation as this is a new problem since his hospitalization in June.  Active Problems:  DIABETES MELLITUS  CARDIOMYOPATHY, ISCHEMIC  Atrial fibrillation  Chronic systolic heart failure  RENAL INSUFFICIENCY  ICD - IN SITU  CAD (coronary artery disease)  Nausea  Acute on chronic systolic heart failure   Signed, Theodore Demark , PA-C 11:00 AM 09/29/2011  Addendum: Pt has had multiple meds for constipation today including an enema, nothing has worked. Will order a mineral oil enema and keep till  am. Possible d/c in am if GI issues improve. Theodore Demark 09/29/2011 5:36 PM  Patient seen earlier and case discussed.  He is frustrated by inability to have a BM.  See above and agree.  He can see me next week in the office, and he and I will review possibly doing PCI of the OM graft.

## 2011-09-29 NOTE — ED Provider Notes (Signed)
Medical screening examination/treatment/procedure(s) were performed by non-physician practitioner and as supervising physician I was immediately available for consultation/collaboration.  Raeford Razor, MD 09/29/11 [phone]

## 2011-09-29 NOTE — Progress Notes (Signed)
I cosign for Corey [name]'s assessment, med administration, notes, I/O, and care plan education. 

## 2011-09-29 NOTE — Consult Note (Signed)
[facility] Medical Consultation  Corey [name] XBM:[id number] DOB: [birth year] DOA: 09/25/2011 PCP: Park Pope, MD   Requesting physician: Dr. Johney Frame, Cardiology Date of consultation: 09/26/2011 Reason for consultation: Nausea, Anorexia  Impression/Recommendations Active Problems:  DIABETES MELLITUS  CARDIOMYOPATHY, ISCHEMIC  Atrial fibrillation  Chronic systolic heart failure  RENAL INSUFFICIENCY  ICD - IN SITU  CAD (coronary artery disease)  Nausea  Acute on chronic systolic heart failure   Corey [name] is a pleasant gentleman experiencing severe nausea and anorexia over the past month that seemed to begin shortly after his MI.  We believe a combination of factors may be contributing to his symptoms.  Change in medications.  The patient was unable to tolerate Amiodarone, Tikosyn, and statins.  Has also had difficulty with metformin.  These meds appear to have been discontinued in the hospital.    GERD:  Agree with protonix started by Primary team.   Irregular bowel habits: likely constipation, he has'nt had a BM in 7 days, he reports that he usually has a BM every 4 days. His acute abd series does not show any bowel obstruction.  His US of the abdomen is within normal limits.  He reports he takes dulcolax once in 3  To 4 days to move his bowels. He refused enema yesterday. He reports he would like to try the enema today. On exam his abdomen is soft, with good bowel movement. And an enema is ordered for him. Continue to monitor. If he doesn't have a Bm with fleet enema, will try to get abd series to evaluate further.   Possible gastroparesis secondary to uncontrolled diabetes.  GE study is within normal limits. His nausea has improved well. No vomiting so far. He reports that he would like to go home.   Pulmonary Hypertension.  Pulm Artery Pressure at 63 mmhg.  Primary team diuresing patient.  Diabetes mellitus: CBG (last 3)   Basename 09/29/11 0639 09/28/11 2138 09/28/11  1550  GLUCAP 90 138* 173*   Better controlled.  Resume glimepiride and SSI  He can be discharged home once he has a BM,.  Please contact me if I can be of assistance in the meanwhile. Thank you for this consultation.      Past Medical History  Diagnosis Date  . Atrial fibrillation     s/p ablation, on tikosyn  . CAD, ARTERY BYPASS GRAFT     4v CABG w/ redo '06; cath 08/14/11 high grade lesion in OM graft, total occlusion of SVG to RCA with collaterals,  patent  LIMA to LAD,occlusion of SVG to Diagonal, and severe segmental LV systolic dysfunction, EF 20%   . CARDIOMYOPATHY, ISCHEMIC   . Chronic systolic heart failure   . Dysuria   . HYPERLIPIDEMIA-MIXED   . RENAL INSUFFICIENCY   . Mitral regurgitation      s/p MV repair '06  . HTN (hypertension)   . COPD (chronic obstructive pulmonary disease)   . Complication of anesthesia     "liked to went crazy w/the mess they gave me; started w/an  A"  . CHF (congestive heart failure)   . Myocardial infarction 1987; ~ 08/17/11  . Anginal pain   . Pacemaker Aug 31, 2011    "battery is dead; it doesn't work"  . ICD (implantable cardiac defibrillator) in place 08/31/11    "battery is dead; it doesn't work"  . Pneumonia     h/o "walking pneumonia; long time ago"  . Shortness of breath August 31, 2011    "all the time"  .  Type II diabetes mellitus   . Arthritis     "in the neck after neck OR"   Past Surgical History  Procedure Date  . Mitral valve repair     with an angioplasty ring  . Cervical discectomy     C3-C4  . Cardiac defibrillator placement ~ 2007    [employer]  . Coronary artery bypass graft 1995    CABG X 2  . Coronary artery bypass graft 2002    CABG "& fixed hole in back of the heart"  . Coronary angioplasty with stent placement     "2 I think"  . Inguinal hernia repair     bilaterally  . Excisional hemorrhoidectomy   . Anterior fusion cervical spine     "went down in my throat to do it"  . Tee without cardioversion 08/30/2011      Procedure: TRANSESOPHAGEAL ECHOCARDIOGRAM (TEE);  Surgeon: Dolores Patty, MD;  Location: [facility] ENDOSCOPY;  Service: Cardiovascular;  Laterality: N/A;  . Cardioversion 08/30/2011    Procedure: CARDIOVERSION;  Surgeon: Dolores Patty, MD;  Location: [facility] ENDOSCOPY;  Service: Cardiovascular;  Laterality: N/A;   Social History:  reports that he quit smoking about 26 years ago. His smoking use included Cigarettes and Pipe. He has a 60 pack-year smoking history. He quit smokeless tobacco use about 53 years ago. His smokeless tobacco use included Snuff and Chew. He reports that he drinks alcohol. He reports that he does not use illicit drugs.  Allergies  Allergen Reactions  . Lorazepam Other (See Comments)    "became unresponsive"  . Codeine Other (See Comments)    "made skin crawl"   Family History  Problem Relation Age of Onset  . Hypertension Other     Prior to Admission medications   Medication Sig Start Date End Date Taking? Authorizing Provider  acetaminophen (TYLENOL) 500 MG tablet Take 500 mg by mouth every 4 (four) hours as needed. For pain   Yes Historical Provider, MD  aspirin 81 MG tablet Take 81 mg by mouth daily.    Yes Historical Provider, MD  fluticasone (FLONASE) 50 MCG/ACT nasal spray Place 2 sprays into the nose 4 (four) times daily as needed. For stuffy nose and sinuses 03/21/11  Yes Historical Provider, MD  furosemide (LASIX) 40 MG tablet Take 20-40 mg by mouth 2 (two) times daily. 1 tab in the morning and 0.5 tab in the evening   Yes Historical Provider, MD  glimepiride (AMARYL) 4 MG tablet Take 8 mg by mouth daily before breakfast.   Yes Historical Provider, MD  isosorbide mononitrate (IMDUR) 60 MG 24 hr tablet Take 60 mg by mouth daily. 08/20/11  Yes Jessica A Hope, PA-C  metFORMIN (GLUCOPHAGE-XR) 500 MG 24 hr tablet Take 500 mg by mouth daily with supper.   Yes Historical Provider, MD  metoprolol (LOPRESSOR) 50 MG tablet Take 25 mg by mouth 2 (two) times daily.     Yes Historical Provider, MD  nitroGLYCERIN (NITROSTAT) 0.4 MG SL tablet Place 0.4 mg under the tongue every 5 (five) minutes x 3 doses as needed. For chest pain   Yes Historical Provider, MD  potassium chloride SA (K-DUR,KLOR-CON) 20 MEQ tablet Take 10-20 mEq by mouth 2 (two) times daily. 0.5 tab in the morning and 1 tab in the evening   Yes Historical Provider, MD  triazolam (HALCION) 0.25 MG tablet Take 0.25 mg by mouth at bedtime.    Yes Historical Provider, MD  warfarin (COUMADIN) 5 MG tablet Take 5  mg by mouth daily.   Yes Historical Provider, MD   Physical Exam: Blood pressure 104/66, pulse 66, temperature 98.4 F (36.9 C), temperature source Oral, resp. rate 18, height 5\' 9"  (1.753 m), weight 82.464 kg (181 lb 12.8 oz), SpO2 93.00%. Filed Vitals:   09/28/11 2120 09/28/11 2136 09/29/11 0534 09/29/11 0943  BP: 94/58 93/57 103/55 104/66  Pulse: 64 38 71 66  Temp:  99.5 F (37.5 C) 99.1 F (37.3 C) 98.4 F (36.9 C)  TempSrc:  Oral Oral Oral  Resp:  18 18 18   Height:      Weight:   82.464 kg (181 lb 12.8 oz)   SpO2:  93% 92% 93%     General:  A&O, comfortable.  Cardiovascular: RRR no M/R/G  Respiratory: CTA no W/C/R  Abdomen: Soft, Active BS, No masses. NT  Psychiatric: Cooperative, Pleasant, Well Groomed.    Neurologic: CN 2-12 grossly intact, non focal.  Labs on Admission:  Basic Metabolic Panel:  Lab 09/28/11 1610 09/27/11 0645 09/26/11 0511 09/25/11 1950  NA 136 138 137 138  K 4.4 4.3 3.6 4.5  CL 99 99 99 98  CO2 27 30 27 28   GLUCOSE 102* 111* 136* 133*  BUN 23 25* 28* 31*  CREATININE 1.32 1.34 1.32 1.42*  CALCIUM 8.4 8.5 8.7 9.1  MG -- -- 2.3 --  PHOS -- -- -- --   CBC:  Lab 09/25/11 1950  WBC 7.2  NEUTROABS --  HGB 12.7*  HCT 38.4*  MCV 90.8  PLT 212   Cardiac Enzymes:  Lab 09/26/11 1025  CKTOTAL 50  CKMB 1.4  CKMBINDEX --  TROPONINI <0.30   CBG:  Lab 09/29/11 0639 09/28/11 2138 09/28/11 1550 09/28/11 1045 09/28/11 0629  GLUCAP 90 138*  173* 209* 109*    Radiological Exams on Admission: Nm Gastric Emptying  09/27/2011  *RADIOLOGY REPORT*  Clinical Data:  Early satiety.  Nausea.  NUCLEAR MEDICINE GASTRIC EMPTYING SCAN  Technique:  After oral ingestion of radiolabeled meal, sequential abdominal images were obtained for 120 minutes.  Residual percentage of activity remaining within the stomach was calculated at 60 and 120 minutes.  Radiopharmaceutical:  2.0 mCi Tc-77m sulfur colloid.  Comparison:  None.  Findings: The patient had 8% retention at 2 hours.  Normal is less than 30% at 2 hours.  IMPRESSION: Normal gastric emptying study.  Original Report Authenticated By: Gwynn Burly, M.D.    Time spent: 25 min.  Kathlen Mody, MD [facility] Pager [phone]  If 7PM-7AM, please contact night-coverage [email or web address] Password [facility] 09/29/2011, 10:31 AM

## 2011-09-30 DIAGNOSIS — K5900 Constipation, unspecified: Secondary | ICD-10-CM

## 2011-09-30 DIAGNOSIS — K219 Gastro-esophageal reflux disease without esophagitis: Secondary | ICD-10-CM

## 2011-09-30 LAB — GLUCOSE, CAPILLARY

## 2011-09-30 MED ORDER — SENNOSIDES-DOCUSATE SODIUM 8.6-50 MG PO TABS: 2.0000 | ORAL_TABLET | Freq: Every day | ORAL | Status: DC | PRN

## 2011-09-30 MED ORDER — PANTOPRAZOLE SODIUM 40 MG PO TBEC: 40.0000 mg | DELAYED_RELEASE_TABLET | Freq: Every day | ORAL | Status: DC

## 2011-09-30 MED ORDER — BISACODYL 5 MG PO TBEC: 10.0000 mg | DELAYED_RELEASE_TABLET | Freq: Every day | ORAL | Status: AC | PRN

## 2011-09-30 NOTE — Discharge Summary (Signed)
Discharge Summary   Patient ID: Corey [name],  MRN: [id number], DOB/AGE: 74-Jun-1937 74 y.o.  Admit date: 09/25/2011 Discharge date: 09/30/2011  Primary Physician: Park Pope, MD Primary Cardiologist: Bonnee Quin, MD; Lewayne Bunting, MD (EP)  Discharge Diagnoses Principal Problem:  *Acute on chronic systolic heart failure Active Problems:  DIABETES MELLITUS  CARDIOMYOPATHY, ISCHEMIC  Atrial fibrillation  Chronic systolic heart failure  RENAL INSUFFICIENCY  ICD - IN SITU  CAD (coronary artery disease)  Nausea  Constipation   Allergies Allergies  Allergen Reactions  . Lorazepam Other (See Comments)    "became unresponsive"  . Codeine Other (See Comments)    "made skin crawl"    Diagnostic Studies/Procedures  PA/LATERAL CHEST X-RAY - 09/25/11  Comparison: 08/25/2011  Findings: Mitral valve replacement. AICD is unchanged. Cardiac  enlargement. Prior CABG.  Negative for edema. Negative for pneumonia or effusion. Mild  vascular congestion.  IMPRESSION:  Mild vascular congestion without edema or pneumonia.  ABDOMINAL CHEST X-RAY - 09/26/11  ABDOMEN - 2 VIEW  Comparison: 07/13/2004  Findings: Nondilated large and small bowel. Negative for bowel  obstruction. Negative for pneumoperitoneum. Gas is present in the  rectum.  IMPRESSION:  No acute abnormality in the abdomen.   ABDOMINAL ULTRASOUND - 09/26/11  COMPLETE ABDOMINAL ULTRASOUND  Comparison: None.  Findings:  Gallbladder: No gallstones, gallbladder wall thickening, or  pericholecystic fluid.  Common bile duct: 4.2 mm  Liver: No focal lesion identified. Within normal limits in  parenchymal echogenicity.  IVC: Appears normal.  Pancreas: No focal abnormality seen.  Spleen: 4.9 cm  Right Kidney: 12.2 cm. Negative for hydronephrosis. 2.6 x 3.6 cm  cyst in the mid pole  Left Kidney: 12.0 cm. Normal  Abdominal aorta: No aneurysm identified.  IMPRESSION:  Left renal cyst. Otherwise negative.  Negative for gallstones.   GASTRIC EMPTYING STUDY - 09/27/11  Comparison: None.  Findings: The patient had 8% retention at 2 hours. Normal is less  than 30% at 2 hours.  IMPRESSION:  Normal gastric emptying study.  History of Present Illness  Corey [name] is a 76yo male with the above problem list. He has a complex cardiac history including CAD (s/p multiple cardiac catheterizations, multiple stent placements, CABG x 4 with re-do and MV repair on 06/2004), ischemic cardiomyopathy, history of atrial fibrillation and VT (s/p BiV ICD implantation 07/2005), history of MR (s/p MV repair 06/2004), type 2 DM, HTN, HL and COPD who was admitted earlier this month with NSTEMI. The LHC revealed severe native coronary artery disease. He has moderate to severe disease of the saphenous vein graft to the acute marginal system. CVTS was consulted, but felt he would be a poor candidate for redo CABG. Most recently, on 07/15, he had returned for generator change of AICD given its ERI status.   Since that time he had noticed progressive DOE, abdominal distention and PND. He denied medication noncompliance or dietary indiscretion. Lasix dose was increased without improvement. CXR as above revealed mild vascular congestion without evidence of edema. pBNP was ~ 4400. EKG and cardiac enzymes revealed no evidence of ischemia. The patient denied chest pain. Cr was found to be mildly elevated. He was subsequently admitted for evaluation and management of acute on chronic systolic CHF.   Hospital Course   He was started on Lasix IV and continued on outpatient cardiac medications. ACEi/ARB and metformin was held in the setting of a mild renal insufficiency. His admitting symptoms continued the following day. Additionally, he endorsed nausea and anorexia prompting medicine  consultation who believed a combination of factors including medication changes, acid reflux, constipation, questionable gastroparesis secondary to  uncontrolled diabetes (A1C 8.1 in 08/14/11) and pulmonary hypertension. PPI was continued. Stool softeners were given. Abdominal x-ray as above revealed no acute abnormality. A gastric emptying study as above revealed no evidence of gastroparesis. An abdominal ultrasound revealed no acute process. Amylase and lipase returned WNL. The patient diuresed well with eventual improvement in his symptoms (I/O: -3511 ccs). ICD was assessed and found to be functioning normally. The patient's chronic renal failure began to improve with a gradual downtrending creatinine with plateau near his baseline. Anxiety was treated. The patient improved and was found to be stable for discharge. He had a large BM the morning of discharge thus alleviating many of his GI symptoms. He will follow-up with Dr. [name] next week for ICD follow-up. Appointments with Dr. [name] and the Coumadin clinic will be attempted to coordinated with this date. He will be discharged on his home medications. Stool softeners PRN will be added. This information has been clearly outlined in the discharge AVS.   Discharge Vitals:  Blood pressure 109/70, pulse 65, temperature 99.8 F (37.7 C), temperature source Oral, resp. rate 16, height 5\' 9"  (1.753 m), weight 82.781 kg (182 lb 8 oz), SpO2 91.00%.   Weight change: 0.318 kg (11.2 oz)  Labs:  Lab 09/28/11 0608 09/27/11 0645 09/26/11 1025 09/26/11 0511  NA 136 138 -- 137  K 4.4 4.3 -- 3.6  CL 99 99 -- 99  CO2 27 30 -- 27  BUN 23 25* -- 28*  CREATININE 1.32 1.34 -- 1.32  CALCIUM 8.4 8.5 -- 8.7  PROT -- -- 6.8 --  BILITOT -- -- 1.0 --  ALKPHOS -- -- 51 --  ALT -- -- 20 --  AST -- -- 22 --  AMYLASE -- -- 64 --  LIPASE -- -- 44 --  GLUCOSE 102* 111* -- 136*   Disposition:  Discharge Orders    Future Appointments: Provider: Department: Dept Phone: Center:   10/04/2011 9:00 AM [facility] [place] [phone] [facility]   12/21/2011 8:45 AM Marinus Maw, MD  [facility] [phone] [facility]     Follow-up Information    Follow up with [facility]. (Please resume previously scheduled appointment on 10/04/11 with Dr. [name]. Office will call with appointment times on that date for Dr. [name] and the Coumadin clinic if possible. )    Contact information:   [address] [place] [phone]          Discharge Medications:  Medication List  As of 09/30/2011  2:20 PM   START taking these medications         bisacodyl 5 MG EC tablet   Commonly known as: DULCOLAX   Take 2 tablets (10 mg total) by mouth daily as needed.      pantoprazole 40 MG tablet   Commonly known as: PROTONIX   Take 1 tablet (40 mg total) by mouth daily at 6 (six) AM.      senna-docusate 8.6-50 MG per tablet   Commonly known as: Senokot-S   Take 2 tablets by mouth daily as needed for constipation.         CONTINUE taking these medications         acetaminophen 500 MG tablet   Commonly known as: TYLENOL      aspirin 81 MG tablet      fluticasone 50 MCG/ACT nasal spray  Commonly known as: FLONASE      glimepiride 4 MG tablet   Commonly known as: AMARYL      isosorbide mononitrate 60 MG 24 hr tablet   Commonly known as: IMDUR      metFORMIN 500 MG 24 hr tablet   Commonly known as: GLUCOPHAGE-XR      metoprolol 50 MG tablet   Commonly known as: LOPRESSOR      nitroGLYCERIN 0.4 MG SL tablet   Commonly known as: NITROSTAT      potassium chloride SA 20 MEQ tablet   Commonly known as: K-DUR,KLOR-CON      triazolam 0.25 MG tablet   Commonly known as: HALCION      warfarin 5 MG tablet   Commonly known as: COUMADIN         ASK your doctor about these medications         furosemide 40 MG tablet   Commonly known as: LASIX          Where to get your medications    These are the prescriptions that you need to pick up. We sent them to a specific pharmacy, so you will need to go there to get them.    [id number] - [place], [place] - [address]    [address] [place] [address]    Phone: [phone]        bisacodyl 5 MG EC tablet   pantoprazole 40 MG tablet   senna-docusate 8.6-50 MG per tablet           Outstanding Labs/Studies: PT/INR at Coumadin clinic on 10/04/11 pending scheduling   Duration of Discharge Encounter: Greater than 30 minutes including physician time.  Signed, R. Hurman Horn, PA-C 09/30/2011, 2:20 PM    I have seen, examined the patient, and reviewed the above assessment and plan.  Changes to above are made where necessary.    Co Sign: Hillis Range, MD 09/30/2011 3:02 PM

## 2011-09-30 NOTE — Progress Notes (Signed)
   SUBJECTIVE: The patient is doing well today.  He had a large BM last evening and feels "much better" today.  His nausea has resolved and appetite is good.  He wants to go home.  At this time, he denies chest pain, shortness of breath, or any new concerns.     Marland Kitchen aspirin  81 mg Oral Daily  . bisacodyl  10 mg Rectal Once  . glimepiride  8 mg Oral QAC breakfast  . insulin aspart  0-9 Units Subcutaneous TID WC  . isosorbide mononitrate  60 mg Oral Daily  . metoprolol  25 mg Oral BID  . ondansetron (ZOFRAN) IV  4 mg Intravenous TID AC  . pantoprazole  40 mg Oral Q0600  . polyethylene glycol  17 g Oral Daily  . senna-docusate  2 tablet Oral Daily  . simethicone  160 mg Oral QID  . sodium chloride  3 mL Intravenous Q12H  . triazolam  0.25 mg Oral QHS  . warfarin  5 mg Oral q1800  . Warfarin - Physician Dosing Inpatient   Does not apply q1800      OBJECTIVE: Physical Exam: Filed Vitals:   09/29/11 0943 09/29/11 1352 09/29/11 2119 09/30/11 0443  BP: 104/66 102/64 112/63 104/67  Pulse: 66 60 72 70  Temp: 98.4 F (36.9 C) 97.3 F (36.3 C) 99.1 F (37.3 C) 99.8 F (37.7 C)  TempSrc: Oral Oral Oral Oral  Resp: 18 19 17 16   Height:      Weight:    182 lb 8 oz (82.781 kg)  SpO2: 93% 93% 92% 91%    Intake/Output Summary (Last 24 hours) at 09/30/11 0935 Last data filed at 09/30/11 0825  Gross per 24 hour  Intake    723 ml  Output    700 ml  Net     23 ml    Telemetry reveals sinus rhythm, BiV Paced  GEN- The patient is well appearing, alert and oriented x 3 today.   Head- normocephalic, atraumatic Eyes-  Sclera clear, conjunctiva pink Ears- hearing intact Oropharynx- clear Neck- supple, no JVP Lymph- no cervical lymphadenopathy Lungs- Clear to ausculation bilaterally, normal work of breathing Heart- Regular rate and rhythm, no murmurs, rubs or gallops, PMI not laterally displaced GI- soft, NT, ND, + BS Extremities- no clubbing, cyanosis, or edema   LABS: Basic  Metabolic Panel:  Basename 09/28/11 0608  NA 136  K 4.4  CL 99  CO2 27  GLUCOSE 102*  BUN 23  CREATININE 1.32  CALCIUM 8.4  MG --  PHOS --    1. Nausea/ constipation - resolved, no further inpatient workup 2. Acute/Chronic systolic heart failure - resolved Resume home medicines  DC to home ICD wound check next week Follow-up with Dr Theadore Nan, MD 09/30/2011 9:35 AM

## 2011-09-30 NOTE — Progress Notes (Signed)
Verbalized understanding of discharge instructions.  Left unit via wheelchair.

## 2011-10-04 ENCOUNTER — Ambulatory Visit (INDEPENDENT_AMBULATORY_CARE_PROVIDER_SITE_OTHER): Payer: Medicare Other | Admitting: *Deleted

## 2011-10-04 ENCOUNTER — Encounter: Payer: Self-pay | Admitting: Internal Medicine

## 2011-10-04 DIAGNOSIS — I2589 Other forms of chronic ischemic heart disease: Secondary | ICD-10-CM

## 2011-10-04 DIAGNOSIS — I5022 Chronic systolic (congestive) heart failure: Secondary | ICD-10-CM

## 2011-10-04 LAB — ICD DEVICE OBSERVATION
AL IMPEDENCE ICD: 559 Ohm
DEVICE MODEL ICD: 10428
HV IMPEDENCE: 47 Ohm
LV LEAD IMPEDENCE ICD: 654 Ohm
LV LEAD THRESHOLD: 2.6 V
RV LEAD AMPLITUDE: 21.3 mv
TZON-0003FASTVT: 352.9 ms
TZON-0003SLOWVT: 461.5 ms
TZST-0001FASTVT: 1
TZST-0003FASTVT: 26 J
TZST-0003FASTVT: 41 J
VENTRICULAR PACING ICD: 99 pct

## 2011-10-04 NOTE — Progress Notes (Signed)
Wound check-ICD 

## 2011-10-09 ENCOUNTER — Ambulatory Visit: Payer: Self-pay | Admitting: Family Medicine

## 2011-10-10 ENCOUNTER — Telehealth: Payer: Self-pay

## 2011-10-10 ENCOUNTER — Telehealth: Payer: Self-pay | Admitting: Cardiology

## 2011-10-10 NOTE — Telephone Encounter (Signed)
Close  

## 2011-10-10 NOTE — Telephone Encounter (Signed)
Pt called states he has not had Coumadin checked since last hospital d/c on 09/25/11.  Pt states he has red splotches on his B LE which seem to be spreading up his legs.  Pt reports having see his primary MD Dr Venora Maples yesterday and he started pt on Doxycycline 100mg  BID x 10 days and advised pt to get his INR checked while on abx.  Pt is unsure what dx was yesterday at primary MD, questioning if he is having a medication rx.  Pt started on Coumadin at the beginning of July and most recent medication addition at hospital d/c on 09/25/11 was Protonix daily. Attempted to call Dr Juanetta Gosling office [phone] to inquire about what his dx yesterday was and what he is treating with Doxycycline.  LMOM TCB at MD office.  Will await call back.

## 2011-10-10 NOTE — Telephone Encounter (Signed)
Spoke with Dr Adah Perl RN she states they saw pt in the office yesterday for the red spotches on B LE and dx pt with cellulitis and treated with rx for Doxycycline 100mg  BID x 10 days which pt started yesterday.  Pt had lab order to check INR today at [facility], she will fax results to [place] office. She has spoken with pt and states he seems to be a little confused, and re-explained treatment plans for cellulitis.   Called pt explained Dr Juanetta Gosling is treating legs with abx.  Advised pt to call their office with any worsening of symptoms or changes in condition.  Advised we will await INR results from labwork drawn today at [facility] and we will call pt with dosage instructions once results received. Pt verbalized understanding.

## 2011-10-12 LAB — PROTIME-INR: INR: 2.3 — AB (ref <=1.1)

## 2011-10-14 ENCOUNTER — Inpatient Hospital Stay: Payer: Self-pay | Admitting: Internal Medicine

## 2011-10-14 LAB — PRO B NATRIURETIC PEPTIDE: B-Type Natriuretic Peptide: 6507 pg/mL — ABNORMAL HIGH (ref 0–125)

## 2011-10-14 LAB — DIFFERENTIAL
Basophil #: 0 10*3/uL (ref 0.0–0.1)
Lymphocyte #: 0.6 10*3/uL — ABNORMAL LOW (ref 1.0–3.6)
Monocyte #: 0.4 x10 3/mm (ref 0.2–1.0)
Monocyte %: 7.7 %
Neutrophil %: 80.3 %

## 2011-10-14 LAB — TROPONIN I: Troponin-I: <0.02 ng/mL

## 2011-10-14 LAB — CBC
MCH: 29.6 pg (ref 26.0–34.0)
MCHC: 33.1 g/dL (ref 32.0–36.0)
MCV: 90 fL (ref 80–100)
Platelet: 245 10*3/uL (ref 150–440)
RBC: 3.8 10*6/uL — ABNORMAL LOW (ref 4.40–5.90)
RDW: 17.1 % — ABNORMAL HIGH (ref 11.5–14.5)
WBC: 5.8 10*3/uL (ref 3.8–10.6)

## 2011-10-14 LAB — BASIC METABOLIC PANEL
Anion Gap: 9 (ref 7–16)
BUN: 27 mg/dL — ABNORMAL HIGH (ref 7–18)
Calcium, Total: 8.4 mg/dL — ABNORMAL LOW (ref 8.5–10.1)
Chloride: 107 mmol/L (ref 98–107)
Creatinine: 1.67 mg/dL — ABNORMAL HIGH (ref 0.60–1.30)
EGFR (African American): 46 — ABNORMAL LOW
EGFR (Non-African Amer.): 40 — ABNORMAL LOW
Osmolality: 290 (ref 275–301)
Potassium: 3.9 mmol/L (ref 3.5–5.1)
Sodium: 140 mmol/L (ref 136–145)

## 2011-10-14 LAB — PROTIME-INR
INR: 2.6
Prothrombin Time: 27.6 secs — ABNORMAL HIGH (ref 11.5–14.7)

## 2011-10-14 LAB — CK TOTAL AND CKMB (NOT AT ARMC): CK, Total: 77 U/L (ref 35–232)

## 2011-10-15 DIAGNOSIS — R0602 Shortness of breath: Secondary | ICD-10-CM

## 2011-10-15 LAB — CBC WITH DIFFERENTIAL/PLATELET
Basophil #: 0 10*3/uL (ref 0.0–0.1)
Basophil %: 0.5 %
HCT: 35.2 % — ABNORMAL LOW (ref 40.0–52.0)
HGB: 11.7 g/dL — ABNORMAL LOW (ref 13.0–18.0)
Lymphocyte #: 0.7 10*3/uL — ABNORMAL LOW (ref 1.0–3.6)
Lymphocyte %: 8.8 %
MCHC: 33.1 g/dL (ref 32.0–36.0)
Monocyte %: 4.7 %
Neutrophil #: 6.5 10*3/uL (ref 1.4–6.5)
RBC: 3.94 10*6/uL — ABNORMAL LOW (ref 4.40–5.90)
WBC: 7.6 10*3/uL (ref 3.8–10.6)

## 2011-10-15 LAB — CK TOTAL AND CKMB (NOT AT ARMC)
CK, Total: 66 U/L (ref 35–232)
CK-MB: 1 ng/mL (ref 0.5–3.6)

## 2011-10-15 LAB — BASIC METABOLIC PANEL
BUN: 26 mg/dL — ABNORMAL HIGH (ref 7–18)
Calcium, Total: 8.6 mg/dL (ref 8.5–10.1)
Co2: 26 mmol/L (ref 21–32)
EGFR (Non-African Amer.): 48 — ABNORMAL LOW
Glucose: 122 mg/dL — ABNORMAL HIGH (ref 65–99)
Osmolality: 285 (ref 275–301)
Potassium: 3.9 mmol/L (ref 3.5–5.1)
Sodium: 140 mmol/L (ref 136–145)

## 2011-10-15 LAB — TROPONIN I: Troponin-I: <0.02 ng/mL

## 2011-10-16 DIAGNOSIS — I5021 Acute systolic (congestive) heart failure: Secondary | ICD-10-CM

## 2011-10-16 LAB — BASIC METABOLIC PANEL
Calcium, Total: 8.7 mg/dL (ref 8.5–10.1)
Chloride: 102 mmol/L (ref 98–107)
EGFR (African American): 51 — ABNORMAL LOW
EGFR (Non-African Amer.): 44 — ABNORMAL LOW
Glucose: 153 mg/dL — ABNORMAL HIGH (ref 65–99)
Osmolality: 284 (ref 275–301)
Potassium: 4 mmol/L (ref 3.5–5.1)
Sodium: 138 mmol/L (ref 136–145)

## 2011-10-16 LAB — PROTIME-INR
INR: 2.6
Prothrombin Time: 28 secs — ABNORMAL HIGH (ref 11.5–14.7)

## 2011-10-17 ENCOUNTER — Ambulatory Visit: Payer: Self-pay | Admitting: Cardiovascular Disease

## 2011-10-17 DIAGNOSIS — Z7901 Long term (current) use of anticoagulants: Secondary | ICD-10-CM

## 2011-10-17 DIAGNOSIS — I4891 Unspecified atrial fibrillation: Secondary | ICD-10-CM

## 2011-10-18 LAB — BASIC METABOLIC PANEL
Anion Gap: 7 (ref 7–16)
Calcium, Total: 8.8 mg/dL (ref 8.5–10.1)
Chloride: 101 mmol/L (ref 98–107)
Co2: 30 mmol/L (ref 21–32)
Creatinine: 1.64 mg/dL — ABNORMAL HIGH (ref 0.60–1.30)
Glucose: 80 mg/dL (ref 65–99)
Osmolality: 281 (ref 275–301)
Potassium: 4.3 mmol/L (ref 3.5–5.1)
Sodium: 138 mmol/L (ref 136–145)

## 2011-10-18 LAB — PROTIME-INR: INR: 1.9

## 2011-10-19 LAB — PROTIME-INR: INR: 1.5

## 2011-10-20 ENCOUNTER — Encounter: Payer: Self-pay | Admitting: Cardiovascular Disease

## 2011-10-20 ENCOUNTER — Telehealth: Payer: Self-pay | Admitting: *Deleted

## 2011-10-20 NOTE — Telephone Encounter (Signed)
Spoke with pt today. He is still having trouble with soreness in legs. He is seeing Dr. Gwen Pounds on 10/24/11 about this. He has had small amount of blood when blowing nose. Denies dryness of nose. No continuous dripping of blood. He is not on coumadin. He is aware of appt with D. Gollan on 11/14/11 at 9:45am Mylo Red RN

## 2011-10-20 NOTE — Telephone Encounter (Signed)
Message copied by Barrie Folk on Fri Oct 20, 2011  3:02 PM ------      Message from: Oneida Arenas      Created: Fri Oct 20, 2011  8:47 AM      Regarding: tcm patient       TCM 2 day call and follow up appt needed.

## 2011-11-02 ENCOUNTER — Emergency Department: Payer: Self-pay | Admitting: Emergency Medicine

## 2011-11-02 ENCOUNTER — Emergency Department: Payer: Self-pay | Admitting: *Deleted

## 2011-11-02 LAB — CBC WITH DIFFERENTIAL/PLATELET
Basophil #: 0.1 10*3/uL (ref 0.0–0.1)
Basophil %: 0.7 %
Eosinophil %: 0 %
HCT: 39 % — ABNORMAL LOW (ref 40.0–52.0)
HGB: 12.9 g/dL — ABNORMAL LOW (ref 13.0–18.0)
Lymphocyte #: 0.8 10*3/uL — ABNORMAL LOW (ref 1.0–3.6)
MCH: 30.1 pg (ref 26.0–34.0)
MCV: 91 fL (ref 80–100)
Monocyte #: 0.6 x10 3/mm (ref 0.2–1.0)
Monocyte %: 5.4 %
Neutrophil #: 10 10*3/uL — ABNORMAL HIGH (ref 1.4–6.5)
Neutrophil %: 86.8 %
RBC: 4.29 10*6/uL — ABNORMAL LOW (ref 4.40–5.90)
WBC: 11.5 10*3/uL — ABNORMAL HIGH (ref 3.8–10.6)

## 2011-11-02 LAB — APTT: Activated PTT: 25.3 secs (ref 23.6–35.9)

## 2011-11-04 ENCOUNTER — Emergency Department (HOSPITAL_COMMUNITY): Payer: Medicare Other | Admitting: Anesthesiology

## 2011-11-04 ENCOUNTER — Encounter (HOSPITAL_COMMUNITY)
Admission: EM | Disposition: A | Discharge status: Home / Self Care | Payer: Self-pay | Source: Home / Self Care | Attending: Emergency Medicine

## 2011-11-04 ENCOUNTER — Encounter (HOSPITAL_COMMUNITY): Payer: Self-pay | Admitting: Anesthesiology

## 2011-11-04 ENCOUNTER — Encounter (HOSPITAL_COMMUNITY): Payer: Self-pay | Admitting: *Deleted

## 2011-11-04 ENCOUNTER — Emergency Department (HOSPITAL_COMMUNITY)
Admission: EM | Admit: 2011-11-04 | Discharge: 2011-11-04 | Disposition: A | Discharge status: Home / Self Care | Payer: Medicare Other | Attending: Emergency Medicine | Admitting: Emergency Medicine

## 2011-11-04 DIAGNOSIS — I509 Heart failure, unspecified: Secondary | ICD-10-CM | POA: Insufficient documentation

## 2011-11-04 DIAGNOSIS — I10 Essential (primary) hypertension: Secondary | ICD-10-CM | POA: Insufficient documentation

## 2011-11-04 DIAGNOSIS — R040 Epistaxis: Secondary | ICD-10-CM | POA: Insufficient documentation

## 2011-11-04 DIAGNOSIS — N289 Disorder of kidney and ureter, unspecified: Secondary | ICD-10-CM | POA: Insufficient documentation

## 2011-11-04 DIAGNOSIS — J449 Chronic obstructive pulmonary disease, unspecified: Secondary | ICD-10-CM | POA: Insufficient documentation

## 2011-11-04 DIAGNOSIS — E119 Type 2 diabetes mellitus without complications: Secondary | ICD-10-CM | POA: Insufficient documentation

## 2011-11-04 DIAGNOSIS — Z95810 Presence of automatic (implantable) cardiac defibrillator: Secondary | ICD-10-CM | POA: Insufficient documentation

## 2011-11-04 DIAGNOSIS — J4489 Other specified chronic obstructive pulmonary disease: Secondary | ICD-10-CM | POA: Insufficient documentation

## 2011-11-04 LAB — CBC WITH DIFFERENTIAL/PLATELET
Basophils Absolute: 0 K/uL (ref 0.0–0.1)
Basophils Relative: 0 % (ref 0–1)
Eosinophils Absolute: 0.1 K/uL (ref 0.0–0.7)
Eosinophils Relative: 1 % (ref 0–5)
HCT: 37.5 % — ABNORMAL LOW (ref 39.0–52.0)
Hemoglobin: 12.3 g/dL — ABNORMAL LOW (ref 13.0–17.0)
Lymphocytes Relative: 9 % — ABNORMAL LOW (ref 12–46)
Lymphs Abs: 1.2 K/uL (ref 0.7–4.0)
MCH: 29.4 pg (ref 26.0–34.0)
MCHC: 32.8 g/dL (ref 30.0–36.0)
MCV: 89.7 fL (ref 78.0–100.0)
Monocytes Absolute: 1 K/uL (ref 0.1–1.0)
Monocytes Relative: 8 % (ref 3–12)
Neutro Abs: 10.8 K/uL — ABNORMAL HIGH (ref 1.7–7.7)
Neutrophils Relative %: 83 % — ABNORMAL HIGH (ref 43–77)
Platelets: 196 K/uL (ref 150–400)
RBC: 4.18 MIL/uL — ABNORMAL LOW (ref 4.22–5.81)
RDW: 20 % — ABNORMAL HIGH (ref 11.5–15.5)
WBC: 13.1 K/uL — ABNORMAL HIGH (ref 4.0–10.5)

## 2011-11-04 LAB — PROTIME-INR
INR: 1.17 (ref 0.00–1.49)
Prothrombin Time: 15.1 seconds (ref 11.6–15.2)

## 2011-11-04 LAB — APTT: aPTT: 26 s (ref 24–37)

## 2011-11-04 SURGERY — CONTROL OF EPISTAXIS, ENDOSCOPIC
Anesthesia: General | Site: Nose | Laterality: Bilateral | Wound class: Clean Contaminated

## 2011-11-04 MED ORDER — OXYMETAZOLINE HCL 0.05 % NA SOLN
NASAL | Status: DC | PRN
  Administered 2011-11-04: 1 via NASAL

## 2011-11-04 MED ORDER — ETOMIDATE 2 MG/ML IV SOLN
INTRAVENOUS | Status: DC | PRN
  Administered 2011-11-04: 20 mg via INTRAVENOUS

## 2011-11-04 MED ORDER — HYDROMORPHONE HCL PF 1 MG/ML IJ SOLN
INTRAMUSCULAR | Status: AC
  Filled 2011-11-04: qty 1

## 2011-11-04 MED ORDER — GLYCOPYRROLATE 0.2 MG/ML IJ SOLN
INTRAMUSCULAR | Status: DC | PRN
  Administered 2011-11-04: .3 mg via INTRAVENOUS

## 2011-11-04 MED ORDER — HYDROMORPHONE HCL PF 1 MG/ML IJ SOLN
0.2500 mg | INTRAMUSCULAR | Status: DC | PRN
  Administered 2011-11-04 (×2): 0.5 mg via INTRAVENOUS

## 2011-11-04 MED ORDER — LACTATED RINGERS IV SOLN
INTRAVENOUS | Status: DC | PRN
  Administered 2011-11-04: 15:00:00 via INTRAVENOUS

## 2011-11-04 MED ORDER — SODIUM CHLORIDE 0.9 % IV SOLN
3.0000 g | Freq: Once | INTRAVENOUS | Status: AC
  Administered 2011-11-04: 3 g via INTRAVENOUS
  Filled 2011-11-04: qty 3

## 2011-11-04 MED ORDER — SUCCINYLCHOLINE CHLORIDE 20 MG/ML IJ SOLN
INTRAMUSCULAR | Status: DC | PRN
  Administered 2011-11-04: 100 mg via INTRAVENOUS

## 2011-11-04 MED ORDER — LIDOCAINE-EPINEPHRINE 1 %-1:100000 IJ SOLN
INTRAMUSCULAR | Status: DC | PRN
  Administered 2011-11-04: 8 mL

## 2011-11-04 MED ORDER — METOCLOPRAMIDE HCL 5 MG/ML IJ SOLN
INTRAMUSCULAR | Status: DC | PRN
  Administered 2011-11-04: 10 mg via INTRAVENOUS

## 2011-11-04 MED ORDER — PHENYLEPHRINE HCL 0.5 % NA SOLN
1.0000 [drp] | Freq: Once | NASAL | Status: AC
  Administered 2011-11-04: 1 [drp] via NASAL
  Filled 2011-11-04: qty 15

## 2011-11-04 MED ORDER — ONDANSETRON HCL 4 MG/2ML IJ SOLN
INTRAMUSCULAR | Status: DC | PRN
  Administered 2011-11-04: 4 mg via INTRAVENOUS

## 2011-11-04 MED ORDER — DROPERIDOL 2.5 MG/ML IJ SOLN: 0.6250 mg | INTRAMUSCULAR | Status: DC | PRN

## 2011-11-04 MED ORDER — NEOSTIGMINE METHYLSULFATE 1 MG/ML IJ SOLN
INTRAMUSCULAR | Status: DC | PRN
  Administered 2011-11-04: 3 mg via INTRAVENOUS

## 2011-11-04 MED ORDER — VECURONIUM BROMIDE 10 MG IV SOLR
INTRAVENOUS | Status: DC | PRN
  Administered 2011-11-04: 3 mg via INTRAVENOUS

## 2011-11-04 MED ORDER — HYDROCORTISONE SOD SUCCINATE 1000 MG IJ SOLR
INTRAMUSCULAR | Status: DC | PRN
  Administered 2011-11-04: 100 mg via INTRAVENOUS

## 2011-11-04 MED ORDER — FENTANYL CITRATE 0.05 MG/ML IJ SOLN
INTRAMUSCULAR | Status: DC | PRN
  Administered 2011-11-04: 100 ug via INTRAVENOUS

## 2011-11-04 SURGICAL SUPPLY — 42 items
ATTRACTOMAT 16X20 MAGNETIC DRP (DRAPES) IMPLANT
BLADE RAD40 ROTATE 4M 4 5PK (BLADE) IMPLANT
BLADE RAD60 ROTATE M4 4 5PK (BLADE) IMPLANT
BLADE TRICUT ROTATE M4 4 5PK (BLADE) ×2 IMPLANT
CANISTER SUCTION 2500CC (MISCELLANEOUS) ×2 IMPLANT
CLOTH BEACON ORANGE TIMEOUT ST (SAFETY) ×2 IMPLANT
COAGULATOR SUCT SWTCH 10FR 6 (ELECTROSURGICAL) ×2 IMPLANT
CORDS BIPOLAR (ELECTRODE) IMPLANT
CRADLE DONUT ADULT HEAD (MISCELLANEOUS) ×1 IMPLANT
DECANTER SPIKE VIAL GLASS SM (MISCELLANEOUS) ×2 IMPLANT
DRESSING NASAL POPE 10X1.5X2.5 (GAUZE/BANDAGES/DRESSINGS) IMPLANT
DRESSING TELFA 8X3 (GAUZE/BANDAGES/DRESSINGS) IMPLANT
DRSG NASAL POPE 10X1.5X2.5 (GAUZE/BANDAGES/DRESSINGS) ×4
ELECT REM PT RETURN 9FT ADLT (ELECTROSURGICAL) ×2
ELECTRODE REM PT RTRN 9FT ADLT (ELECTROSURGICAL) ×1 IMPLANT
FILTER ARTHROSCOPY CONVERTOR (FILTER) ×2 IMPLANT
FLOSEAL 10ML (HEMOSTASIS) IMPLANT
GLOVE SURG SS PI 7.5 STRL IVOR (GLOVE) ×2 IMPLANT
GOWN STRL NON-REIN LRG LVL3 (GOWN DISPOSABLE) ×4 IMPLANT
KIT BASIN OR (CUSTOM PROCEDURE TRAY) ×2 IMPLANT
KIT ROOM TURNOVER OR (KITS) ×2 IMPLANT
NEEDLE SPNL 25GX3.5 QUINCKE BL (NEEDLE) ×2 IMPLANT
NS IRRIG 1000ML POUR BTL (IV SOLUTION) ×2 IMPLANT
PAD ARMBOARD 7.5X6 YLW CONV (MISCELLANEOUS) ×4 IMPLANT
PATTIES SURGICAL .5 X3 (DISPOSABLE) ×2 IMPLANT
PENCIL FOOT CONTROL (ELECTRODE) ×2 IMPLANT
SHEATH ENDOSCRUB 0 DEG (SHEATH) IMPLANT
SHEATH ENDOSCRUB 30 DEG (SHEATH) IMPLANT
SHEATH ENDOSCRUB 45 DEG (SHEATH) IMPLANT
SOLUTION ANTI FOG 6CC (MISCELLANEOUS) ×2 IMPLANT
SPECIMEN JAR SMALL (MISCELLANEOUS) ×3 IMPLANT
STRIP CLOSURE SKIN 1/2X4 (GAUZE/BANDAGES/DRESSINGS) ×2 IMPLANT
SUT ETHILON 3 0 PS 1 (SUTURE) IMPLANT
SWAB COLLECTION DEVICE MRSA (MISCELLANEOUS) IMPLANT
SYR 50ML SLIP (SYRINGE) IMPLANT
SYRINGE 10CC LL (SYRINGE) ×2 IMPLANT
TOWEL OR 17X24 6PK STRL BLUE (TOWEL DISPOSABLE) ×2 IMPLANT
TOWEL OR 17X26 10 PK STRL BLUE (TOWEL DISPOSABLE) ×2 IMPLANT
TRAY ENT MC OR (CUSTOM PROCEDURE TRAY) ×2 IMPLANT
TUBE CONNECTING 12X1/4 (SUCTIONS) ×2 IMPLANT
WATER STERILE IRR 1000ML POUR (IV SOLUTION) ×2 IMPLANT
WIPE INSTRUMENT VISIWIPE 73X73 (MISCELLANEOUS) ×1 IMPLANT

## 2011-11-04 NOTE — ED Notes (Signed)
Notified surgeon that pt. Is vomiting blood and with bilateral nasal packing and blood continues to run from his mouth.

## 2011-11-04 NOTE — ED Notes (Signed)
Dr. Kelly Splinter notified that he is ready for pt. To go to OR.

## 2011-11-04 NOTE — ED Notes (Signed)
Pt. Noted to have pulle dout IV when getting out of bed.  Pt. Transported to OR.  Report given to Dr. Krista Blue.

## 2011-11-04 NOTE — ED Provider Notes (Signed)
History     CSN: [id number]  Arrival date & time 11/04/11  1478   First MD Initiated Contact with Patient 11/04/11 [phone]      Chief Complaint  Patient presents with  . Epistaxis    (Consider location/radiation/quality/duration/timing/severity/associated sxs/prior treatment) HPI Comments: Patient presents with intermittent nose bleeds over the past three days   Patient is a 76 y.o. male presenting with nosebleeds. The history is provided by the patient.  Epistaxis  This is a recurrent problem. Episode onset: 3 days ago. Episode frequency: every few hours. The problem has not changed since onset.The problem is associated with aspirin. The bleeding has been from both nares. He has tried nothing for the symptoms. His past medical history does not include sinus problems.    Past Medical History  Diagnosis Date  . Atrial fibrillation     s/p ablation, on tikosyn  . CAD, ARTERY BYPASS GRAFT     4v CABG w/ redo '06; cath 08/14/11 high grade lesion in OM graft, total occlusion of SVG to RCA with collaterals,  patent  LIMA to LAD,occlusion of SVG to Diagonal, and severe segmental LV systolic dysfunction, EF 20%   . CARDIOMYOPATHY, ISCHEMIC   . Chronic systolic heart failure   . Dysuria   . HYPERLIPIDEMIA-MIXED   . RENAL INSUFFICIENCY   . Mitral regurgitation      s/p MV repair '06  . HTN (hypertension)   . COPD (chronic obstructive pulmonary disease)   . Complication of anesthesia     "liked to went crazy w/the mess they gave me; started w/an  A"  . CHF (congestive heart failure)   . Myocardial infarction 1987; ~ 08/17/11  . Anginal pain   . Pacemaker 09/16/2011    "battery is dead; it doesn't work"  . ICD (implantable cardiac defibrillator) in place 2011/09/16    "battery is dead; it doesn't work"  . Pneumonia     h/o "walking pneumonia; long time ago"  . Shortness of breath 2011/09/16    "all the time"  . Type II diabetes mellitus   . Arthritis     "in the neck after neck OR"     Past Surgical History  Procedure Date  . Mitral valve repair     with an angioplasty ring  . Cervical discectomy     C3-C4  . Cardiac defibrillator placement ~ 2007    [employer]ant  . Coronary artery bypass graft 1995    CABG X 2  . Coronary artery bypass graft 2002    CABG "& fixed hole in back of the heart"  . Coronary angioplasty with stent placement     "2 I think"  . Inguinal hernia repair     bilaterally  . Excisional hemorrhoidectomy   . Anterior fusion cervical spine     "went down in my throat to do it"  . Tee without cardioversion 08/30/2011    Procedure: TRANSESOPHAGEAL ECHOCARDIOGRAM (TEE);  Surgeon: Dolores Patty, MD;  Location: [facility] ENDOSCOPY;  Service: Cardiovascular;  Laterality: N/A;  . Cardioversion 08/30/2011    Procedure: CARDIOVERSION;  Surgeon: Dolores Patty, MD;  Location: [facility] ENDOSCOPY;  Service: Cardiovascular;  Laterality: N/A;    Family History  Problem Relation Age of Onset  . Hypertension Other     History  Substance Use Topics  . Smoking status: Former Smoker -- 1.5 packs/day for 40 years    Types: Cigarettes, Pipe    Quit date: 03/06/1985  . Smokeless tobacco: Former Neurosurgeon  Types: Snuff, Chew    Quit date: 03/06/1958   Comment: "quit chewing and dipping in my early 20's"  . Alcohol Use: Yes     08/24/11 "last alcohol 15 years ago or better"      Review of Systems  HENT: Positive for nosebleeds.     Allergies  Coumadin; Lorazepam; and Codeine  Home Medications   Current Outpatient Rx  Name Route Sig Dispense Refill  . ACETAMINOPHEN 500 MG PO TABS Oral Take 500 mg by mouth every 4 (four) hours as needed. For pain    . ASPIRIN 81 MG PO TABS Oral Take 81 mg by mouth daily.     Marland Kitchen FLUTICASONE PROPIONATE 50 MCG/ACT NA SUSP Nasal Place 2 sprays into the nose 4 (four) times daily as needed. For stuffy nose and sinuses    . FUROSEMIDE 40 MG PO TABS Oral Take 20-40 mg by mouth 2 (two) times daily. 1 tab in the morning and 0.5 tab in  the evening    . GLIMEPIRIDE 4 MG PO TABS Oral Take 8 mg by mouth daily before breakfast.    . ISOSORBIDE MONONITRATE ER 60 MG PO TB24 Oral Take 60 mg by mouth daily.    Marland Kitchen METFORMIN HCL ER 500 MG PO TB24 Oral Take 500 mg by mouth daily with supper.    Marland Kitchen METOPROLOL TARTRATE 50 MG PO TABS Oral Take 25 mg by mouth 2 (two) times daily.     Marland Kitchen NITROGLYCERIN 0.4 MG SL SUBL Sublingual Place 0.4 mg under the tongue every 5 (five) minutes x 3 doses as needed. For chest pain    . PANTOPRAZOLE SODIUM 40 MG PO TBEC Oral Take 1 tablet (40 mg total) by mouth daily at 6 (six) AM. 30 tablet 3  . POTASSIUM CHLORIDE CRYS ER 20 MEQ PO TBCR Oral Take 10-20 mEq by mouth 2 (two) times daily. 0.5 tab in the morning and 1 tab in the evening    . SENNOSIDES-DOCUSATE SODIUM 8.6-50 MG PO TABS Oral Take 2 tablets by mouth daily as needed for constipation. 60 tablet 3  . TRIAZOLAM 0.25 MG PO TABS Oral Take 0.25 mg by mouth at bedtime.     . WARFARIN SODIUM 5 MG PO TABS Oral Take 5 mg by mouth daily.      BP 141/78  Pulse 81  Temp 98 F (36.7 C) (Oral)  Resp 18  SpO2 95%  Physical Exam  Nursing note and vitals reviewed. Constitutional: He is oriented to person, place, and time. He appears well-developed and well-nourished. No distress.  HENT:  Head: Normocephalic and atraumatic.       The nares are noted to have blood, but no active bleeding.  The site of bleeding I am unable to identify.  Neck: Normal range of motion. Neck supple.  Cardiovascular: Normal rate and regular rhythm.   No murmur heard. Pulmonary/Chest: Effort normal and breath sounds normal. No respiratory distress.  Abdominal: Soft. Bowel sounds are normal.  Musculoskeletal: Normal range of motion.  Neurological: He is alert and oriented to person, place, and time.  Skin: Skin is warm and dry. He is not diaphoretic.    ED Course  Procedures (including critical care time)   Labs Reviewed  CBC WITH DIFFERENTIAL  APTT  PROTIME-INR   No  results found.   No diagnosis found.    MDM  The patient presents here with intermittent nose bleeds for the past three days.  He was not actively bleeding when he arrived  here.  Labs were obtained revealing Hb of 12.3 and platelets and coags were okay.  He had some intermittent bleeding while in the ED.  I attempted to place a neosynephrine packing without much success.  As he had been packed at [facility] and here and continues to bleed, I feel as though ENT evaluation is indicated.  I contacted Dr. Emeline Darling who saw the patient in the ER and will take him to the OR when he has completed his other cases.    While waiting to go to the OR, he continued to have episodic bleeding from both nares and the back of the throat.  I had considered placing a posterior packing, however each time the bleeding started, it seemed to stop spontaneously.  He was eventually taken to the OR.        Geoffery Lyons, MD 11/06/11 [phone]

## 2011-11-04 NOTE — Op Note (Signed)
DATE OF OPERATION: 11/04/11 4:41 PM Surgeon: Melvenia Beam Procedure Performed:  Bilateral endoscopic control of nasal hemorrhage and endoscopic ligation of bilateral sphenopalatine artery [id number]-right endoscopic nasal mass biopsy  PREOPERATIVE DIAGNOSIS: recurrent epistaxis POSTOPERATIVE DIAGNOSIS: recurrent epistaxis, right anterior nasal mass  SURGEON: Melvenia Beam ANESTHESIA: General endotracheal.  ESTIMATED BLOOD LOSS: Approximately 50 mL.  DRAINS/DRESSINGS: bilateral 9cm merocel packs taped to cheeks SPECIMENS: right nasal mass INDICATIONS: The patient is a 75yo with a history of recurrent epistaxis for 3 days DESCRIPTION OF OPERATION: The patient was brought to the operating room and was placed in the supine position and was placed under general endotracheal anesthesia by anesthesiology. The patient's nose was decongested with Afrin-coated pledgets which were then removed. The patient was prepped and draped in the usual sterile fashion. After the Afrin pledgets were removed, the inferior turbinates were outfractured. The bilateral greater palatine arteries were injected with 1% lidocaine with epinephrine in the standard transpalatal fashion. The zero degree endoscope was used to examine the nose and copious blood clot was suctioned from both nasal cavities.  I first began on the left. The middle turbinate was deflected medially and the crista ethmoidalis was identified. The suction Bovie cautery was used to thoroughly cauterize the left sphenopalatine artery. Mucosal bleeding from the left inferior turbinate was also cauterized.   Next I turned to the right side. A large, pale, polypoid mass was noted attached to the medial surface of the right middle turbinate and the right inferior turbinate and right side of the septum. An excisional biopsy was performed by removing the visible portions of the mass and gross disease using the 0 degree endoscope and the straight Blakesley  forceps. The mass was passed off and sent for permanent pathology as right nasal mass.  Next the middle turbinate was deflected medially and the crista ethmoidalis was identified. The Bovie suction cautery was used to thoroughly cauterize the right sphenopalatine artery. Mucosal bleeding from the middle turbinate, inferior turbinate, and right anterior septum was also cauterized. The choana, torus tubarius, adenoid pad, and eustachian tubes appeared normal.   The nose and stomach were suctioned out and the patient was turned back to anesthesia and awakened from anesthesia and extubated without difficulty. The patient tolerated the procedure well with no immediate complications and was taken to the postoperative recovery area in good condition.   Dr. Melvenia Beam was present and performed the entire procedure. 11/04/11 4:41 PM Melvenia Beam

## 2011-11-04 NOTE — Anesthesia Preprocedure Evaluation (Addendum)
Anesthesia Evaluation  Patient identified by MRN, date of birth, ID band Patient awake    Reviewed: Allergy & Precautions, H&P , NPO status , Patient's Chart, lab work & pertinent test results, reviewed documented beta blocker date and time   History of Anesthesia Complications Negative for: history of anesthetic complications  Airway Mallampati: II TM Distance: <3 FB     Dental  (+) Teeth Intact and Dental Advisory Given   Pulmonary shortness of breath, pneumonia -, resolved, COPD COPD inhaler,  breath sounds clear to auscultation        Cardiovascular hypertension, + angina + CAD, + Past MI and +CHF + dysrhythmias - pacemaker+ Cardiac Defibrillator Rhythm:Regular Rate:Normal  Discussed AICD/Pacemaker with Dr. Royann Shivers (on call for EP) who recommends application of magnet to AICD during procedure.  Pacemaker function should not be altered with magnet.  Does not recommend interrogation after procedure. This was confirmed with the [facility] technical support.   Neuro/Psych    GI/Hepatic   Endo/Other    Renal/GU Renal InsufficiencyRenal disease     Musculoskeletal   Abdominal   Peds  Hematology   Anesthesia Other Findings   Reproductive/Obstetrics                         Anesthesia Physical Anesthesia Plan  ASA: III  Anesthesia Plan: General   Post-op Pain Management:    Induction: Rapid sequence  Airway Management Planned: Oral ETT  Additional Equipment:   Intra-op Plan:   Post-operative Plan: Extubation in OR  Informed Consent: I have reviewed the patients History and Physical, chart, labs and discussed the procedure including the risks, benefits and alternatives for the proposed anesthesia with the patient or authorized representative who has indicated his/her understanding and acceptance.   Dental advisory given  Plan Discussed with: CRNA, Anesthesiologist and Surgeon  Anesthesia Plan  Comments:         Anesthesia Quick Evaluation

## 2011-11-04 NOTE — Transfer of Care (Signed)
Immediate Anesthesia Transfer of Care Note  Patient: Corey [name]  Procedure(s) Performed: Procedure(s) (LRB): NASAL ENDOSCOPY WITH EPISTAXIS CONTROL (Bilateral)  Patient Location: PACU  Anesthesia Type: General  Level of Consciousness: awake, alert , oriented, patient cooperative and responds to stimulation  Airway & Oxygen Therapy: Patient Spontanous Breathing and Patient connected to face mask oxygen  Post-op Assessment: Report given to PACU RN, Post -op Vital signs reviewed and stable, Patient moving all extremities and Patient moving all extremities X 4  Post vital signs: Reviewed and stable  Complications: No apparent anesthesia complications

## 2011-11-04 NOTE — Anesthesia Procedure Notes (Signed)
Procedure Name: Intubation Date/Time: 11/04/2011 3:37 PM Performed by: Wray Kearns A Pre-anesthesia Checklist: Patient identified, Timeout performed, Emergency Drugs available, Suction available and Patient being monitored Patient Re-evaluated:Patient Re-evaluated prior to inductionOxygen Delivery Method: Circle system utilized Preoxygenation: Pre-oxygenation with 100% oxygen Intubation Type: IV induction, Cricoid Pressure applied and Rapid sequence Laryngoscope Size: Mac and 4 Grade View: Grade I Tube type: Oral Tube size: 7.5 mm Number of attempts: 1 Airway Equipment and Method: Stylet Placement Confirmation: ETT inserted through vocal cords under direct vision,  breath sounds checked- equal and bilateral and positive ETCO2 Secured at: 23 cm Tube secured with: Tape Dental Injury: Teeth and Oropharynx as per pre-operative assessment

## 2011-11-04 NOTE — Anesthesia Postprocedure Evaluation (Signed)
Anesthesia Post Note  Patient: Corey [name]  Procedure(s) Performed: Procedure(s) (LRB): NASAL ENDOSCOPY WITH EPISTAXIS CONTROL (Bilateral)  Anesthesia type: general  Patient location: PACU  Post pain: Pain level controlled  Post assessment: Patient's Cardiovascular Status Stable  Last Vitals:  Filed Vitals:   11/04/11 1725  BP: 151/91  Pulse: 78  Temp:   Resp: 15    Post vital signs: Reviewed and stable  Level of consciousness: sedated  Complications: No apparent anesthesia complications

## 2011-11-04 NOTE — ED Notes (Signed)
Pt is here with nose bleeds intermittently over last three days.  Nose is not bleeding now.  Pt states only on ASA.

## 2011-11-04 NOTE — ED Notes (Signed)
Pt. Noted vomiting blood and significant amount of bleeding from t he nose. Dr. Judd Lien notified and present at bedside.

## 2011-11-04 NOTE — Preoperative (Signed)
Beta Blockers   Reason not to administer Beta Blockers:Pt took Metoprolol 25mg  po @1500  on 11/04/2011.

## 2011-11-04 NOTE — H&P (Signed)
11/04/11 10:49 AM  Corey [name]  PREOPERATIVE HISTORY AND PHYSICAL/CONSULT NOTE  CHIEF COMPLAINT: bilateral recurrent epistaxis  HISTORY: This is a 76 year old who presents with recurrent epistaxis bilaterally for the past 3 days. The ER tried phenylephrine spray and a rhinorocket but the patient complains of persistent bleeding.  He now presents for nasal endoscopy and control of epistaxis.  Dr. Emeline Darling, [place] has discussed the risks (including recurrent bleeding, infection, risks of general anesthesia), benefits, and alternatives of this procedure. The patient understands the risks and would like to proceed with the procedure. The chances of success of the procedure are >50% and the patient understands this. I personally performed an examination of the patient within 24 hours of the procedure.  PAST MEDICAL HISTORY: Past Medical History  Diagnosis Date  . Atrial fibrillation     s/p ablation, on tikosyn  . CAD, ARTERY BYPASS GRAFT     4v CABG w/ redo '06; cath 08/14/11 high grade lesion in OM graft, total occlusion of SVG to RCA with collaterals,  patent  LIMA to LAD,occlusion of SVG to Diagonal, and severe segmental LV systolic dysfunction, EF 20%   . CARDIOMYOPATHY, ISCHEMIC   . Chronic systolic heart failure   . Dysuria   . HYPERLIPIDEMIA-MIXED   . RENAL INSUFFICIENCY   . Mitral regurgitation      s/p MV repair '06  . HTN (hypertension)   . COPD (chronic obstructive pulmonary disease)   . Complication of anesthesia     "liked to went crazy w/the mess they gave me; started w/an  A"  . CHF (congestive heart failure)   . Myocardial infarction 1987; ~ 08/17/11  . Anginal pain   . Pacemaker 09-22-11    "battery is dead; it doesn't work"  . ICD (implantable cardiac defibrillator) in place September 22, 2011    "battery is dead; it doesn't work"  . Pneumonia     h/o "walking pneumonia; long time ago"  . Shortness of breath 2011-09-22    "all the time"  . Type II diabetes mellitus   .  Arthritis     "in the neck after neck OR"    PAST SURGICAL HISTORY: Past Surgical History  Procedure Date  . Mitral valve repair     with an angioplasty ring  . Cervical discectomy     C3-C4  . Cardiac defibrillator placement ~ 2007    Guidant  . Coronary artery bypass graft 1995    CABG X 2  . Coronary artery bypass graft 2002    CABG "& fixed hole in back of the heart"  . Coronary angioplasty with stent placement     "2 I think"  . Inguinal hernia repair     bilaterally  . Excisional hemorrhoidectomy   . Anterior fusion cervical spine     "went down in my throat to do it"  . Tee without cardioversion 08/30/2011    Procedure: TRANSESOPHAGEAL ECHOCARDIOGRAM (TEE);  Surgeon: Dolores Patty, MD;  Location: [facility] ENDOSCOPY;  Service: Cardiovascular;  Laterality: N/A;  . Cardioversion 08/30/2011    Procedure: CARDIOVERSION;  Surgeon: Dolores Patty, MD;  Location: [facility] ENDOSCOPY;  Service: Cardiovascular;  Laterality: N/A;    MEDICATIONS: No current facility-administered medications on file prior to encounter.   Current Outpatient Prescriptions on File Prior to Encounter  Medication Sig Dispense Refill  . acetaminophen (TYLENOL) 500 MG tablet Take 500 mg by mouth every 4 (four) hours as needed. For pain      . aspirin 81 MG  tablet Take 81 mg by mouth daily.       . furosemide (LASIX) 40 MG tablet Take 60 mg by mouth 2 (two) times daily.       Marland Kitchen glimepiride (AMARYL) 4 MG tablet Take 8 mg by mouth daily before breakfast.      . isosorbide mononitrate (IMDUR) 60 MG 24 hr tablet Take 60 mg by mouth daily.      . metoprolol (LOPRESSOR) 50 MG tablet Take 25 mg by mouth 2 (two) times daily.       . nitroGLYCERIN (NITROSTAT) 0.4 MG SL tablet Place 0.4 mg under the tongue every 5 (five) minutes x 3 doses as needed. For chest pain      . potassium chloride SA (K-DUR,KLOR-CON) 20 MEQ tablet Take 10-20 mEq by mouth 2 (two) times daily. 0.5 tab in the morning and 1 tab in the evening        . triazolam (HALCION) 0.25 MG tablet Take 0.25 mg by mouth at bedtime as needed. For sleep        ALLERGIES: Allergies  Allergen Reactions  . Coumadin (Warfarin Sodium) Hives    Pt states he broke out all over his body even the bottom of his feet  . Lorazepam Other (See Comments)    "became unresponsive"  . Codeine Other (See Comments)    "made skin crawl"    SOCIAL HISTORY: History   Social History  . Marital Status: Widowed    Spouse Name: N/A    Number of Children: N/A  . Years of Education: N/A   Occupational History  . Not on file.   Social History Main Topics  . Smoking status: Former Smoker -- 1.5 packs/day for 40 years    Types: Cigarettes, Pipe    Quit date: 03/06/1985  . Smokeless tobacco: Former Neurosurgeon    Types: Snuff, Dorna Bloom    Quit date: 03/06/1958   Comment: "quit chewing and dipping in my early 20's"  . Alcohol Use: Yes     08/24/11 "last alcohol 15 years ago or better"  . Drug Use: No  . Sexually Active: No   Other Topics Concern  . Not on file   Social History Narrative  . No narrative on file    FAMILY HISTORY: Family History  Problem Relation Age of Onset  . Hypertension Other     REVIEW OF SYSTEMS:  HEENT: epistaxis, otherwise negative x 10 systems except per HPI   PHYSICAL EXAM:  GENERAL:  NAD VITAL SIGNS:   Filed Vitals:   11/04/11 0759  BP: 141/78  Pulse: 81  Temp: 98 F (36.7 C)  Resp: 18  SKIN:  Warm, dry HEENT:  Dried blood in nasal cavity BL, spitting up intermittent blood-tinged mucous NECK:  Supple, trachea midline LYMPH:  noLAD LUNGS:  Grossly clear CARDIOVASCULAR:  RRR ABDOMEN:  soft MUSCULOSKELETAL: normal strength PSYCH:  Awake, alert NEUROLOGIC:  CN 2-12 intact and symmetric  DIAGNOSTIC STUDIES: Hemoglobin 12  ASSESSMENT AND PLAN: Plan to proceed with bilateral nasal endoscopy and cautery/control of epistaxis. Patient understands the risks, benefits, and alternatives.  11/04/11 10:49 AM Corey [name]

## 2011-11-14 ENCOUNTER — Ambulatory Visit (INDEPENDENT_AMBULATORY_CARE_PROVIDER_SITE_OTHER): Payer: Medicare Other | Admitting: Cardiovascular Disease

## 2011-11-14 ENCOUNTER — Encounter: Payer: Self-pay | Admitting: Cardiovascular Disease

## 2011-11-14 VITALS — BP 110/68 | HR 73 | Ht 69.0 in | Wt 176.2 lb

## 2011-11-14 DIAGNOSIS — I25810 Atherosclerosis of coronary artery bypass graft(s) without angina pectoris: Secondary | ICD-10-CM

## 2011-11-14 DIAGNOSIS — I5023 Acute on chronic systolic (congestive) heart failure: Secondary | ICD-10-CM

## 2011-11-14 DIAGNOSIS — I255 Ischemic cardiomyopathy: Secondary | ICD-10-CM

## 2011-11-14 DIAGNOSIS — I4891 Unspecified atrial fibrillation: Secondary | ICD-10-CM

## 2011-11-14 DIAGNOSIS — R21 Rash and other nonspecific skin eruption: Secondary | ICD-10-CM

## 2011-11-14 DIAGNOSIS — E785 Hyperlipidemia, unspecified: Secondary | ICD-10-CM

## 2011-11-14 DIAGNOSIS — I2589 Other forms of chronic ischemic heart disease: Secondary | ICD-10-CM

## 2011-11-14 MED ORDER — METOLAZONE 5 MG PO TABS: 5.0000 mg | ORAL_TABLET | Freq: Every day | ORAL | Status: DC | PRN

## 2011-11-14 NOTE — Assessment & Plan Note (Signed)
Currently with no symptoms of angina. No further workup at this time. Continue current medication regimen. 

## 2011-11-14 NOTE — Assessment & Plan Note (Addendum)
Appears to be sinus rhythm today though difficult to determine as it. He denies any palpitations concerning for atrial fibrillation. Pacemaker check next month. He does not want anticoagulation. We will try our best to maintain normal sinus rhythm and can adjust medications as needed for paroxysmal atrial ablation.

## 2011-11-14 NOTE — Assessment & Plan Note (Signed)
Severe reaction, Coumadin vasculitis.

## 2011-11-14 NOTE — Progress Notes (Signed)
Patient ID: Corey [name], male    DOB: December 25, 1935, 76 y.o.   MRN: [id number]  HPI Comments: Corey [name] is a 76 year old male with history of coronary artery disease, bypass surgery, ischemic cardiomyopathy, non-ST elevation MI in June 2013 with medical management of his coronary artery disease, defibrillator for ventricular tachycardia, chronic atrial fibrillation, diabetes who presented to the hospital in August 2013 with rash of the lower extremities and volume overload. He was taking Lasix 40 mg twice a day on presentation.   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 He has been seen by dermatology and wound healing has been slow. He has just finished a course of steroids. Recently he has had nosebleeds x2 and has gone to the emergency room twice. Recent packing was removed. He does not appreciate any tachycardia or palpitations. He is taking Lasix but does report continued lower extremity edema.  EKG today shows paced rhythm with a rate of 73 beats per minute He has chosen not to try any other anticoagulants given his recent leg condition. He also does not want a statin as he reports trying these in the past with significant myalgias.   Outpatient Encounter Prescriptions as of 11/14/2011  Medication Sig Dispense Refill  . acetaminophen (TYLENOL) 500 MG tablet Take 500 mg by mouth every 4 (four) hours as needed. For pain      . aspirin 81 MG tablet Take 81 mg by mouth daily.       . digoxin (LANOXIN) 0.25 MG tablet Take 0.25 mg by mouth daily.      . furosemide (LASIX) 40 MG tablet Take 60 mg by mouth 2 (two) times daily.       Marland Kitchen glimepiride (AMARYL) 4 MG tablet Take 8 mg by mouth daily before breakfast.      . isosorbide mononitrate (IMDUR) 60 MG 24 hr tablet Take 60 mg by mouth daily.      . metoprolol (LOPRESSOR) 50 MG tablet Take 25 mg by mouth 2 (two) times daily.       . nitroGLYCERIN (NITROSTAT) 0.4 MG SL tablet Place  0.4 mg under the tongue every 5 (five) minutes x 3 doses as needed. For chest pain      . phenylephrine (NEO-SYNEPHRINE) 0.25 % nasal spray Place 1 spray into the nose every 4 (four) hours as needed. For nose bleed      . potassium chloride SA (K-DUR,KLOR-CON) 20 MEQ tablet Take 10-20 mEq by mouth 2 (two) times daily. 0.5 tab in the morning and 1 tab in the evening      . predniSONE (DELTASONE) 20 MG tablet Take 20 mg by mouth as directed. Take 3 tablets for 5 days, 2 tablets for 3 days, 1 tablet for 5 days, then 1 tablet every other day for 5 doses.      Marland Kitchen senna-docusate (SENOKOT-S) 8.6-50 MG per tablet Take 2 tablets by mouth daily as needed.      . triazolam (HALCION) 0.25 MG tablet Take 0.25 mg by mouth at bedtime as needed. For sleep      . metolazone (ZAROXOLYN) 5 MG tablet Take 1 tablet (5 mg total) by mouth daily as needed. Take thirty minutes before morning lasix  90 tablet  3    Review of Systems  Constitutional: Negative.   HENT: Negative.   Eyes: Negative.   Respiratory: Negative.   Cardiovascular: Positive for leg swelling.  Gastrointestinal: Negative.   Musculoskeletal:  Negative.   Skin: Negative.        Large wounds bilaterally of the lower extremities  Neurological: Negative.   Hematological: Negative.   Psychiatric/Behavioral: Negative.   All other systems reviewed and are negative.    BP 110/68  Pulse 73  Ht 5\' 9"  (1.753 m)  Wt 176 lb 4 oz (79.946 kg)  BMI 26.03 kg/m2  Physical Exam  Nursing note and vitals reviewed. Constitutional: He is oriented to person, place, and time. He appears well-developed and well-nourished.  HENT:  Head: Normocephalic.  Nose: Nose normal.  Mouth/Throat: Oropharynx is clear and moist.  Eyes: Conjunctivae are normal. Pupils are equal, round, and reactive to light.  Neck: Normal range of motion. Neck supple. No JVD present.  Cardiovascular: Normal rate, regular rhythm, S1 normal, S2 normal, normal heart sounds and intact distal  pulses.  Exam reveals no gallop and no friction rub.   No murmur heard.      1+ pitting edema of the shins bilaterally  Pulmonary/Chest: Effort normal and breath sounds normal. No respiratory distress. He has no wheezes. He has no rales. He exhibits no tenderness.  Abdominal: Soft. Bowel sounds are normal. He exhibits no distension. There is no tenderness.  Musculoskeletal: Normal range of motion. He exhibits no edema and no tenderness.  Lymphadenopathy:    He has no cervical adenopathy.  Neurological: He is alert and oriented to person, place, and time. Coordination normal.  Skin: Skin is warm and dry. No rash noted. No erythema.       Deep large wounds of the legs bilaterally  Psychiatric: He has a normal mood and affect. His behavior is normal. Judgment and thought content normal.           Assessment and Plan

## 2011-11-14 NOTE — Assessment & Plan Note (Signed)
He denies any significant shortness of breath but does have significant lower extremity edema that is pitting. We have prescribed metolazone for him to take 30 minutes prior to a.m. Lasix on an as-needed basis. We have encouraged him to improve his long-term edema to assist with wound healing.

## 2011-11-14 NOTE — Assessment & Plan Note (Signed)
He is not interested in cholesterol medication. Previous myalgias.

## 2011-11-14 NOTE — Patient Instructions (Addendum)
Please metolazone as needed for leg swelling Take 30 minutes before morning lasix  Take sparingly  Please call us if you have new issues that need to be addressed before your next appt.  Your physician wants you to follow-up in: 3 months.  You will receive a reminder letter in the mail two months in advance. If you don't receive a letter, please call our office to schedule the follow-up appointment.

## 2011-12-20 ENCOUNTER — Encounter: Payer: Self-pay | Admitting: *Deleted

## 2011-12-20 DIAGNOSIS — Z95810 Presence of automatic (implantable) cardiac defibrillator: Secondary | ICD-10-CM | POA: Insufficient documentation

## 2011-12-21 ENCOUNTER — Encounter: Payer: Medicare Other | Admitting: Internal Medicine

## 2011-12-26 ENCOUNTER — Encounter: Payer: Self-pay | Admitting: Internal Medicine

## 2011-12-26 ENCOUNTER — Ambulatory Visit (INDEPENDENT_AMBULATORY_CARE_PROVIDER_SITE_OTHER): Payer: Medicare Other | Admitting: Internal Medicine

## 2011-12-26 VITALS — BP 120/60 | HR 76 | Ht 69.0 in | Wt 181.2 lb

## 2011-12-26 DIAGNOSIS — I4891 Unspecified atrial fibrillation: Secondary | ICD-10-CM

## 2011-12-26 DIAGNOSIS — I5023 Acute on chronic systolic (congestive) heart failure: Secondary | ICD-10-CM

## 2011-12-26 DIAGNOSIS — Z95810 Presence of automatic (implantable) cardiac defibrillator: Secondary | ICD-10-CM

## 2011-12-26 DIAGNOSIS — I2510 Atherosclerotic heart disease of native coronary artery without angina pectoris: Secondary | ICD-10-CM

## 2011-12-26 LAB — ICD DEVICE OBSERVATION
AL AMPLITUDE: 3.6 mv
DEV-0020ICD: NEGATIVE
HV IMPEDENCE: 52 Ohm
LV LEAD AMPLITUDE: 22.6 mv
LV LEAD THRESHOLD: 2.7 V
RV LEAD AMPLITUDE: 25 mv
RV LEAD THRESHOLD: 0.5 V
TZST-0001FASTVT: 2
TZST-0001FASTVT: 3
TZST-0003FASTVT: 41 J

## 2011-12-26 NOTE — Assessment & Plan Note (Signed)
His [employer] biventricular ICD is working normally. We'll plan to recheck in several months. 

## 2011-12-26 NOTE — Assessment & Plan Note (Signed)
He currently denies anginal symptoms. No change in medical therapy. I've encouraged the patient to try to remain active.

## 2011-12-26 NOTE — Progress Notes (Signed)
HPI Corey [name] returns today for followup. He is a very pleasant 76 year old man with chronic atrial fibrillation, complete heart block, and ischemic cardiomyopathy, status post biventricular ICD implantation. 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 He has chronic pain from his lower extremities now but appears to have healed up from the results of the vasculitis. He has chronic class III heart failure. We had him in sinus rhythm on amiodarone, but he could not tolerate the medication. He has lost approximately 20 pounds in the last year. Allergies  Allergen Reactions  . Coumadin (Warfarin Sodium) Hives    Pt states he broke out all over his body even the bottom of his feet  . Lorazepam Other (See Comments)    "became unresponsive"  . Codeine Other (See Comments)    "made skin crawl"     Current Outpatient Prescriptions  Medication Sig Dispense Refill  . acetaminophen (TYLENOL) 500 MG tablet Take 500 mg by mouth every 4 (four) hours as needed. For pain      . aspirin 81 MG tablet Take 81 mg by mouth daily.       . digoxin (LANOXIN) 0.125 MG tablet Take 0.125 mg by mouth daily.      . furosemide (LASIX) 40 MG tablet Take 60 mg by mouth 2 (two) times daily.       Marland Kitchen glimepiride (AMARYL) 4 MG tablet Take 8 mg by mouth daily before breakfast.      . isosorbide mononitrate (IMDUR) 60 MG 24 hr tablet Take 60 mg by mouth daily.      . metoprolol (LOPRESSOR) 50 MG tablet Take 25 mg by mouth 2 (two) times daily.       . nitroGLYCERIN (NITROSTAT) 0.4 MG SL tablet Place 0.4 mg under the tongue every 5 (five) minutes x 3 doses as needed. For chest pain      . potassium chloride SA (K-DUR,KLOR-CON) 20 MEQ tablet Take 10-20 mEq by mouth 2 (two) times daily. 0.5 tab in the morning and 1 tab in the evening      . triazolam (HALCION) 0.25 MG tablet Take 0.25 mg by mouth at bedtime as needed. For  sleep         Past Medical History  Diagnosis Date  . Atrial fibrillation     s/p ablation, on tikosyn  . CAD, ARTERY BYPASS GRAFT     4v CABG w/ redo '06; cath 08/14/11 high grade lesion in OM graft, total occlusion of SVG to RCA with collaterals,  patent  LIMA to LAD,occlusion of SVG to Diagonal, and severe segmental LV systolic dysfunction, EF 20%   . CARDIOMYOPATHY, ISCHEMIC   . Chronic systolic heart failure   . Dysuria   . HYPERLIPIDEMIA-MIXED   . RENAL INSUFFICIENCY   . Mitral regurgitation      s/p MV repair '06  . HTN (hypertension)   . COPD (chronic obstructive pulmonary disease)   . Complication of anesthesia     "liked to went crazy w/the mess they gave me; started w/an  A"  . CHF (congestive heart failure)   . Myocardial infarction 1987; ~ 08/17/11  . Anginal pain   . Pacemaker 09/11/2011    "battery is dead; it doesn't work"  . ICD (implantable cardiac defibrillator) in place 11-Sep-2011    "battery is dead; it doesn't work"  . Pneumonia     h/o "walking pneumonia; long time ago"  .  Shortness of breath 08/24/11    "all the time"  . Type II diabetes mellitus   . Arthritis     "in the neck after neck OR"    ROS:   All systems reviewed and negative except as noted in the HPI.   Past Surgical History  Procedure Date  . Mitral valve repair     with an angioplasty ring  . Cervical discectomy     C3-C4  . Cardiac defibrillator placement ~ 2007    Guidant  . Coronary artery bypass graft 1995    CABG X 2  . Coronary artery bypass graft 2002    CABG "& fixed hole in back of the heart"  . Coronary angioplasty with stent placement     "2 I think"  . Inguinal hernia repair     bilaterally  . Excisional hemorrhoidectomy   . Anterior fusion cervical spine     "went down in my throat to do it"  . Tee without cardioversion 08/30/2011    Procedure: TRANSESOPHAGEAL ECHOCARDIOGRAM (TEE);  Surgeon: Dolores Patty, MD;  Location: [facility] ENDOSCOPY;  Service: Cardiovascular;   Laterality: N/A;  . Cardioversion 08/30/2011    Procedure: CARDIOVERSION;  Surgeon: Dolores Patty, MD;  Location: [facility] ENDOSCOPY;  Service: Cardiovascular;  Laterality: N/A;  . Mass biopsy 2013    nasal eposcopic     Family History  Problem Relation Age of Onset  . Hypertension Other      History   Social History  . Marital Status: Widowed    Spouse Name: N/A    Number of Children: N/A  . Years of Education: N/A   Occupational History  . Not on file.   Social History Main Topics  . Smoking status: Former Smoker -- 1.5 packs/day for 40 years    Types: Cigarettes, Pipe    Quit date: 03/06/1985  . Smokeless tobacco: Former Neurosurgeon    Types: Snuff, Dorna Bloom    Quit date: 03/06/1958   Comment: "quit chewing and dipping in my early 20's"  . Alcohol Use: No     08/24/11 "last alcohol 15 years ago or better"  . Drug Use: No  . Sexually Active: No   Other Topics Concern  . Not on file   Social History Narrative  . No narrative on file     BP 120/60  Pulse 76  Ht 5\' 9"  (1.753 m)  Wt 181 lb 4 oz (82.214 kg)  BMI 26.77 kg/m2  Physical Exam:  Chronically ill appearing 75 year old man, NAD HEENT: Unremarkable Neck:  No JVD, no thyromegally Lungs:  Clear with no wheezes, rales, or rhonchi. HEART:  Regular rate rhythm, no murmurs, no rubs, no clicks Abd:  soft, positive bowel sounds, no organomegally, no rebound, no guarding Ext:  2 plus pulses, no edema, no cyanosis, no clubbing Skin:  No rashes no nodules Neuro:  CN II through XII intact, motor grossly intact  DEVICE  Normal device function.  See PaceArt for details.   Assess/Plan:

## 2011-12-26 NOTE — Patient Instructions (Addendum)
FOLLOW UP 3 MONTHS WITH PAULA  FOLLOW UP DR [name] IN 6 MONTHS

## 2011-12-26 NOTE — Assessment & Plan Note (Signed)
Interrogation of his device demonstrates normal function. While he thought he felt a shock several days ago, device interrogation demonstrates no shocks.

## 2011-12-28 ENCOUNTER — Other Ambulatory Visit: Payer: Self-pay | Admitting: Cardiology

## 2011-12-28 NOTE — Telephone Encounter (Signed)
Pt has some confusion about his medications. Pt states he is still taking imdur 60mg  1//2 po daily and has some questions about his digoxin. Can you speak with Dr. [name] and confirm that this is the dose that he wants the pt on or does he want to increase the dose of imdur?

## 2011-12-29 ENCOUNTER — Other Ambulatory Visit: Payer: Self-pay

## 2011-12-29 MED ORDER — ISOSORBIDE MONONITRATE ER 60 MG PO TB24: 60.0000 mg | ORAL_TABLET | Freq: Every day | ORAL | Status: DC

## 2011-12-29 NOTE — Telephone Encounter (Signed)
Left message to call back  

## 2011-12-29 NOTE — Telephone Encounter (Signed)
Refill sent for isosorbide 60 mg take one tablet daily.

## 2012-01-02 NOTE — Telephone Encounter (Signed)
I spoke with the pt and he already had his medication issues handled by the [place] office.  The pt is now taking Isosorbide MN 60mg  daily per the [place] office. The pt is now following with Dr Mariah Milling for Cardiology follow-up.

## 2012-02-16 ENCOUNTER — Ambulatory Visit (INDEPENDENT_AMBULATORY_CARE_PROVIDER_SITE_OTHER): Payer: Medicare Other | Admitting: Cardiovascular Disease

## 2012-02-16 ENCOUNTER — Encounter: Payer: Self-pay | Admitting: Cardiovascular Disease

## 2012-02-16 VITALS — BP 122/72 | HR 71 | Ht 69.0 in | Wt 183.0 lb

## 2012-02-16 DIAGNOSIS — I2510 Atherosclerotic heart disease of native coronary artery without angina pectoris: Secondary | ICD-10-CM

## 2012-02-16 DIAGNOSIS — E119 Type 2 diabetes mellitus without complications: Secondary | ICD-10-CM

## 2012-02-16 DIAGNOSIS — I25810 Atherosclerosis of coronary artery bypass graft(s) without angina pectoris: Secondary | ICD-10-CM

## 2012-02-16 DIAGNOSIS — I5023 Acute on chronic systolic (congestive) heart failure: Secondary | ICD-10-CM

## 2012-02-16 DIAGNOSIS — I10 Essential (primary) hypertension: Secondary | ICD-10-CM

## 2012-02-16 DIAGNOSIS — I208 Other forms of angina pectoris: Secondary | ICD-10-CM

## 2012-02-16 DIAGNOSIS — R0602 Shortness of breath: Secondary | ICD-10-CM

## 2012-02-16 DIAGNOSIS — E785 Hyperlipidemia, unspecified: Secondary | ICD-10-CM

## 2012-02-16 DIAGNOSIS — I4891 Unspecified atrial fibrillation: Secondary | ICD-10-CM

## 2012-02-16 MED ORDER — SIMVASTATIN 20 MG PO TABS: 20.0000 mg | ORAL_TABLET | Freq: Every day | ORAL | Status: DC

## 2012-02-16 MED ORDER — METOPROLOL TARTRATE 50 MG PO TABS: 25.0000 mg | ORAL_TABLET | Freq: Two times a day (BID) | ORAL | Status: DC

## 2012-02-16 MED ORDER — POTASSIUM CHLORIDE CRYS ER 20 MEQ PO TBCR: 20.0000 meq | EXTENDED_RELEASE_TABLET | Freq: Two times a day (BID) | ORAL | Status: DC

## 2012-02-16 MED ORDER — METOLAZONE 5 MG PO TABS: 5.0000 mg | ORAL_TABLET | Freq: Every day | ORAL | Status: DC | PRN

## 2012-02-16 MED ORDER — FUROSEMIDE 40 MG PO TABS: 60.0000 mg | ORAL_TABLET | Freq: Two times a day (BID) | ORAL | Status: DC

## 2012-02-16 MED ORDER — DIGOXIN 125 MCG PO TABS: 0.1250 mg | ORAL_TABLET | Freq: Every day | ORAL | Status: DC

## 2012-02-16 NOTE — Progress Notes (Signed)
Patient ID: Corey [name], male    DOB: 04/02/1935, 76 y.o.   MRN: [id number]  HPI Comments: Corey [name] is a 76 year old male with history of diabetes,  coronary artery disease, bypass surgery 1995, ischemic cardiomyopathy, non-ST elevation MI in June 2013 with medical management recommended his coronary artery disease, ICD /defibrillator for ventricular tachycardia (notes indicating complete heart block and), chronic atrial fibrillation,  who presented to the hospital in August 2013 with rash of the lower extremities and volume overload. He was taking Lasix 40 mg twice a day on presentation.  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  He completed  a course of steroids.   Previous  nosebleeds x2  when his last clinic visit and has gone to the emergency room twice. Recent packing was removed. He does not appreciate any tachycardia or palpitations. He is taking Lasix twice a day with occasional metolazone 30 minutes prior to a.m. Lasix . On this regimen, his lower extremity edema improved . He has taken metolazone 7 times since his last clinic visit.   He reports weight is approximately 170-173 pounds . This morning at home, weight was 173 pounds . In the office weight is 183 pounds . Lower extremity edema has resolved .  He does continue to have diffuse itching. No further nosebleeds. Followup in October 2013 with Dr. [name]. Defibrillator is functioning appropriately.   He does report that he gives out, significant shortness of breath if he pushes himself too much. At that point he has to take a break to recover. He does have constipation and takes milk of magnesia.  EKG today shows paced rhythm with a rate of 71 beats per minute He has chosen not to try any other anticoagulants given his  leg condition/vasculitis and nosebleeds.. Previously he did not want a cholesterol medication the reports today he would be  open to trying  a different one .   Outpatient Encounter Prescriptions as of 02/16/2012  Medication Sig Dispense Refill  . acetaminophen (TYLENOL) 500 MG tablet Take 500 mg by mouth every 4 (four) hours as needed. For pain      . aspirin 81 MG tablet Take 81 mg by mouth daily.       . digoxin (LANOXIN) 0.125 MG tablet Take 0.125 mg by mouth daily.      . furosemide (LASIX) 40 MG tablet Take 60 mg by mouth 2 (two) times daily.       Marland Kitchen glimepiride (AMARYL) 4 MG tablet Take 8 mg by mouth daily before breakfast.      . isosorbide mononitrate (IMDUR) 60 MG 24 hr tablet Take 1 tablet (60 mg total) by mouth daily.  30 tablet  3  . metolazone (ZAROXOLYN) 5 MG tablet Take 5 mg by mouth daily as needed.      . metoprolol (LOPRESSOR) 50 MG tablet Take 25 mg by mouth 2 (two) times daily.       . nitroGLYCERIN (NITROSTAT) 0.4 MG SL tablet Place 0.4 mg under the tongue every 5 (five) minutes x 3 doses as needed. For chest pain      . potassium chloride SA (K-DUR,KLOR-CON) 20 MEQ tablet Take 10-20 mEq by mouth 2 (two) times daily. 0.5 tab in the morning and 1 tab in the evening      . triazolam (HALCION) 0.25 MG tablet Take 0.25 mg by mouth at bedtime as needed. For sleep  Review of Systems  Constitutional: Positive for fatigue.  HENT: Negative.   Eyes: Negative.   Respiratory: Positive for shortness of breath.   Gastrointestinal: Negative.   Musculoskeletal: Negative.   Skin: Negative.        Large wounds bilaterally of the lower extremities  Neurological: Negative.   Hematological: Negative.   Psychiatric/Behavioral: Negative.   All other systems reviewed and are negative.    BP 122/72  Pulse 71  Ht 5\' 9"  (1.753 m)  Wt 183 lb (83.008 kg)  BMI 27.02 kg/m2  Physical Exam  Nursing note and vitals reviewed. Constitutional: He is oriented to person, place, and time. He appears well-developed and well-nourished.  HENT:  Head: Normocephalic.  Nose: Nose normal.  Mouth/Throat:  Oropharynx is clear and moist.  Eyes: Conjunctivae normal are normal. Pupils are equal, round, and reactive to light.  Neck: Normal range of motion. Neck supple. No JVD present.  Cardiovascular: Normal rate, regular rhythm, S1 normal, S2 normal, normal heart sounds and intact distal pulses.  Exam reveals no gallop and no friction rub.   No murmur heard. Pulmonary/Chest: Effort normal and breath sounds normal. No respiratory distress. He has no wheezes. He has no rales. He exhibits no tenderness.  Abdominal: Soft. Bowel sounds are normal. He exhibits no distension. There is no tenderness.  Musculoskeletal: Normal range of motion. He exhibits no edema and no tenderness.  Lymphadenopathy:    He has no cervical adenopathy.  Neurological: He is alert and oriented to person, place, and time. Coordination normal.  Skin: Skin is warm and dry. No rash noted. No erythema.       Deep large wounds have healed, residual scars noted  Psychiatric: He has a normal mood and affect. His behavior is normal. Judgment and thought content normal.           Assessment and Plan

## 2012-02-16 NOTE — Patient Instructions (Addendum)
You are doing well. Please start simvastatin 1/2 pill once a day After a few weeks, increase to a full pill one a day  We will check your labs today  Please call us if you have new issues that need to be addressed before your next appt.  Your physician wants you to follow-up in: 6 months.  You will receive a reminder letter in the mail two months in advance. If you don't receive a letter, please call our office to schedule the follow-up appointment.

## 2012-02-16 NOTE — Assessment & Plan Note (Signed)
Currently with no symptoms of angina. No further workup at this time. Continue current medication regimen. 

## 2012-02-16 NOTE — Assessment & Plan Note (Signed)
Shortness of breath and some tightness with heavy exertion. No significant change in his symptoms. At previous catheterization, medical management was recommended.

## 2012-02-16 NOTE — Assessment & Plan Note (Signed)
Blood pressure is well controlled on today's visit. No changes made to the medications. 

## 2012-02-16 NOTE — Assessment & Plan Note (Signed)
Notes indicate chronic atrial fibrillation. He is not interested in anticoagulation given problems with vasculitis on warfarin, nosebleeds and active itching.

## 2012-02-16 NOTE — Assessment & Plan Note (Signed)
He is interested in restarting a cholesterol medication. He does not or Crestor or Lipitor. We'll start simvastatin 10 mg for several weeks titrating up to 20 mg daily.

## 2012-02-16 NOTE — Assessment & Plan Note (Signed)
Clinically, appears to be euvolemic. We will check blood work today including basic metabolic panel, digoxin level. He reports good weight for him is in the low 170 range. His home scale is different from our office scale by 10 pounds (higher in the office). No changes to his medications with labs pending.

## 2012-02-17 LAB — BASIC METABOLIC PANEL
BUN/Creatinine Ratio: 21 (ref 10–22)
BUN: 28 mg/dL — ABNORMAL HIGH (ref 8–27)
CO2: 24 mmol/L (ref 19–28)
Calcium: 9.2 mg/dL (ref 8.6–10.2)
Creatinine, Ser: 1.34 mg/dL — ABNORMAL HIGH (ref 0.76–1.27)
GFR calc non Af Amer: 52 mL/min/{1.73_m2} — ABNORMAL LOW (ref >59)

## 2012-02-17 LAB — HEMOGLOBIN A1C
Est. average glucose Bld gHb Est-mCnc: 209 mg/dL
Hgb A1c MFr Bld: 8.9 % — ABNORMAL HIGH (ref 4.8–5.6)

## 2012-04-29 ENCOUNTER — Other Ambulatory Visit: Payer: Self-pay | Admitting: Cardiovascular Disease

## 2012-04-29 NOTE — Telephone Encounter (Signed)
Refilled Isosorbide. 

## 2012-05-22 ENCOUNTER — Encounter: Payer: Self-pay | Admitting: Internal Medicine

## 2012-05-22 ENCOUNTER — Ambulatory Visit (INDEPENDENT_AMBULATORY_CARE_PROVIDER_SITE_OTHER): Payer: Medicare Other | Admitting: Internal Medicine

## 2012-05-22 VITALS — BP 110/70 | HR 76 | Temp 97.4°F | Ht 69.18 in | Wt 180.5 lb

## 2012-05-22 DIAGNOSIS — I10 Essential (primary) hypertension: Secondary | ICD-10-CM

## 2012-05-22 DIAGNOSIS — K5900 Constipation, unspecified: Secondary | ICD-10-CM

## 2012-05-22 DIAGNOSIS — I4891 Unspecified atrial fibrillation: Secondary | ICD-10-CM

## 2012-05-22 DIAGNOSIS — E785 Hyperlipidemia, unspecified: Secondary | ICD-10-CM

## 2012-05-22 DIAGNOSIS — I2510 Atherosclerotic heart disease of native coronary artery without angina pectoris: Secondary | ICD-10-CM

## 2012-05-22 DIAGNOSIS — I2589 Other forms of chronic ischemic heart disease: Secondary | ICD-10-CM

## 2012-05-22 DIAGNOSIS — R300 Dysuria: Secondary | ICD-10-CM

## 2012-05-22 DIAGNOSIS — N259 Disorder resulting from impaired renal tubular function, unspecified: Secondary | ICD-10-CM

## 2012-05-22 DIAGNOSIS — R21 Rash and other nonspecific skin eruption: Secondary | ICD-10-CM

## 2012-05-22 DIAGNOSIS — E119 Type 2 diabetes mellitus without complications: Secondary | ICD-10-CM

## 2012-05-22 DIAGNOSIS — K219 Gastro-esophageal reflux disease without esophagitis: Secondary | ICD-10-CM

## 2012-05-22 DIAGNOSIS — Z125 Encounter for screening for malignant neoplasm of prostate: Secondary | ICD-10-CM

## 2012-05-22 NOTE — Patient Instructions (Addendum)
It was nice meeting you today.  I want you to try the miralax powder (mix in the morning) - daily.  Let me know if any problems.

## 2012-05-23 ENCOUNTER — Encounter: Payer: Self-pay | Admitting: Internal Medicine

## 2012-05-23 NOTE — Assessment & Plan Note (Signed)
Did not bring in any sugar readings.  Discussed the importance of eating regular meals and appropriate snacks - including a bedtime snack.  Check and record sugars.  Check metabolic panel and a1c as well as urine micuralbumin/cr ratio.  Keep up to date with eye checks.

## 2012-05-23 NOTE — Assessment & Plan Note (Signed)
Stable.  Continues follow up with Dr Gollan.   

## 2012-05-23 NOTE — Assessment & Plan Note (Signed)
Known disease.  Appears to be stable currently.  Continue aggressive risk factor modification.

## 2012-05-23 NOTE — Assessment & Plan Note (Signed)
Appears to be controlled.  

## 2012-05-23 NOTE — Assessment & Plan Note (Signed)
Blood pressure under good control.  Same medication regimen.  Check metabolic panel.    

## 2012-05-23 NOTE — Assessment & Plan Note (Signed)
Takes lasix regularly and doses (extra doses) - prn.  Currently stable.  Follow. Check metabolic panel.   

## 2012-05-23 NOTE — Assessment & Plan Note (Signed)
Appears to be a chronic problem for him.  Discussed using miralax daily and see if bowels improve.  Can continue the prn suppositories if needed.  Discussed colonoscopy.  Desires to hold off at this point.

## 2012-05-23 NOTE — Assessment & Plan Note (Signed)
Low cholesterol diet.  On simvastatin. Check lipid panel and liver function.

## 2012-05-23 NOTE — Assessment & Plan Note (Signed)
Did not bring in any sugar readings.  Discussed the importance of eating regular meals and appropriate snacks - including a bedtime snack.  Check and record sugars.  Check metabolic panel and a1c as well as urine micuralbumin/cr ratio.  Keep up to date with eye checks. 

## 2012-05-23 NOTE — Assessment & Plan Note (Signed)
Avoid antiinflammatories.  Check metabolic panel.  

## 2012-05-23 NOTE — Progress Notes (Signed)
Subjective:    Patient ID: Corey [name], male    DOB: 1935-07-18, 77 y.o.   MRN: [id number]  HPI 77 year old male with past history of CAD s/p CABG x 2, chronic systolic heart failure, renal insufficiency, hypercholesterolemia, hypertension and diabetes.  He comes in today to follow up on these issues as well as to establish care.  Former patient of Dr Juanetta Gosling.  Sees Dr Mariah Milling.  Reports overall he feels his symptoms are relatively stable - currently.  He does have to intermittently take extra lasix.  Normally takes one bid and occasionally will take two in the am and one in the evening.  Apparently has done better clinically since adjusting the lasix as needed.  States in the last several weeks, he has had to take an extra lasix on two occasions.  He does get sob with exertion.  Has to sit and rest.  This does not appear to be new.  Appears to be at his baseline.  He is diabetic.  Brought in no recorded sugar readings.  States this am his sugar was 145.  Takes amaryl and metformin.  Eats on an average - two meals per day.  Discussed eating regular meals and a snack before bed.  He reports having problems with his bowels.  He takes two ex lax and two suppositories prn.  Just had a good bowel movement yesterday.  Discussed using miralax on a regular basis.  No blood in his stool.  No urine change.  Takes halcion (1/2 tablet) q hs.  States this helps him sleep and he has been taking this for years with no adverse effects.    Past Medical History  Diagnosis Date  . Atrial fibrillation     s/p ablation, on tikosyn  . CAD, ARTERY BYPASS GRAFT     4v CABG w/ redo '06; cath 08/14/11 high grade lesion in OM graft, total occlusion of SVG to RCA with collaterals,  patent  LIMA to LAD,occlusion of SVG to Diagonal, and severe segmental LV systolic dysfunction, EF 20%   . CARDIOMYOPATHY, ISCHEMIC   . Chronic systolic heart failure   . Dysuria   . HYPERLIPIDEMIA-MIXED   . RENAL INSUFFICIENCY   . Mitral  regurgitation      s/p MV repair '06  . HTN (hypertension)   . COPD (chronic obstructive pulmonary disease)   . Complication of anesthesia     "liked to went crazy w/the mess they gave me; started w/an  A"  . CHF (congestive heart failure)   . Myocardial infarction 1987; ~ 08/17/11  . Anginal pain   . Pacemaker Sep 05, 2011    "battery is dead; it doesn't work"  . ICD (implantable cardiac defibrillator) in place 2011/09/05    "battery is dead; it doesn't work"  . Pneumonia     h/o "walking pneumonia; long time ago"  . Shortness of breath September 05, 2011    "all the time"  . Type II diabetes mellitus   . Arthritis     "in the neck after neck OR"     Outpatient Encounter Prescriptions as of 05/22/2012  Medication Sig Dispense Refill  . acetaminophen (TYLENOL) 500 MG tablet Take 500 mg by mouth every 4 (four) hours as needed. For pain      . aspirin 81 MG tablet Take 81 mg by mouth daily.       . digoxin (LANOXIN) 0.125 MG tablet Take 1 tablet (0.125 mg total) by mouth daily.  90 tablet  3  .  furosemide (LASIX) 40 MG tablet Take 1.5 tablets (60 mg total) by mouth 2 (two) times daily.  270 tablet  3  . glimepiride (AMARYL) 4 MG tablet Take 8 mg by mouth 2 (two) times daily.       . isosorbide mononitrate (IMDUR) 60 MG 24 hr tablet TAKE 1 TABLET (60 MG TOTAL) BY MOUTH DAILY.  30 tablet  3  . metFORMIN (GLUCOPHAGE) 500 MG tablet Take 500 mg by mouth daily with breakfast.      . metolazone (ZAROXOLYN) 5 MG tablet Take 1 tablet (5 mg total) by mouth daily as needed.  30 tablet  6  . metoprolol (LOPRESSOR) 50 MG tablet Take 25 mg by mouth daily.      . nitroGLYCERIN (NITROSTAT) 0.4 MG SL tablet Place 0.4 mg under the tongue every 5 (five) minutes x 3 doses as needed. For chest pain      . potassium chloride SA (K-DUR,KLOR-CON) 20 MEQ tablet Take 20 mEq by mouth daily.      . simvastatin (ZOCOR) 20 MG tablet Take 1 tablet (20 mg total) by mouth at bedtime.  30 tablet  11  . triazolam (HALCION) 0.25 MG  tablet Take 0.25 mg by mouth at bedtime as needed. For sleep      . [DISCONTINUED] metoprolol (LOPRESSOR) 50 MG tablet Take 0.5 tablets (25 mg total) by mouth 2 (two) times daily.  90 tablet  3  . [DISCONTINUED] potassium chloride SA (K-DUR,KLOR-CON) 20 MEQ tablet Take 1 tablet (20 mEq total) by mouth 2 (two) times daily.  180 tablet  3   No facility-administered encounter medications on file as of 05/22/2012.    Review of Systems Patient denies any headache, lightheadedness or dizziness. No sinus or allergy symptoms.   No chest pain, tightness or palpitations currently.  Gets sob with exertion.  Has to sit and rest.  Breathing appears to be stable.  No increased cough or congestion.  No nausea or vomiting.  No abdominal pain or cramping.  No diarrhea, BRBPR or melana.  Does have issues with constipation.  See above.  No urine change.        Objective:   Physical Exam Filed Vitals:   05/22/12 0918  BP: 110/70  Pulse: 76  Temp: 97.4 F (36.3 C)   Blood pressure recheck;  120/78, pulse 78  77 year old male in no acute distress.  HEENT:  Nares - clear.  Oropharynx - without lesions. NECK:  Supple.  Nontender.  No audible carotid bruit.  HEART:  Appears to be regular.   LUNGS:  No crackles or wheezing audible.  Respirations even and unlabored.   RADIAL PULSE:  Equal bilaterally.  ABDOMEN:  Soft.  Nontender.  Bowel sounds present and normal.  No audible abdominal bruit.   EXTREMITIES:  No increased edema present.  DP pulses palpable and equal bilaterally.         Assessment & Plan:  HEALTH MAINTENANCE.  Schedule a physical for next visit.  Obtain outside records for review.  Has never had a colonoscopy.  We discussed this today.  Desires to hold off on pursuing a colonoscopy.

## 2012-06-07 ENCOUNTER — Other Ambulatory Visit (INDEPENDENT_AMBULATORY_CARE_PROVIDER_SITE_OTHER): Payer: Medicare Other

## 2012-06-07 DIAGNOSIS — Z125 Encounter for screening for malignant neoplasm of prostate: Secondary | ICD-10-CM

## 2012-06-07 DIAGNOSIS — E119 Type 2 diabetes mellitus without complications: Secondary | ICD-10-CM

## 2012-06-07 DIAGNOSIS — I4891 Unspecified atrial fibrillation: Secondary | ICD-10-CM

## 2012-06-07 DIAGNOSIS — E785 Hyperlipidemia, unspecified: Secondary | ICD-10-CM

## 2012-06-07 LAB — LIPID PANEL
HDL: 23.8 mg/dL — ABNORMAL LOW (ref >39.00)
LDL Cholesterol: 84 mg/dL (ref 0–99)
Total CHOL/HDL Ratio: 5
Triglycerides: 113 mg/dL (ref 0.0–149.0)

## 2012-06-07 LAB — BASIC METABOLIC PANEL
CO2: 32 mEq/L (ref 19–32)
Calcium: 9.4 mg/dL (ref 8.4–10.5)
GFR: 50.51 mL/min — ABNORMAL LOW (ref >60.00)
Potassium: 3.9 mEq/L (ref 3.5–5.1)
Sodium: 134 mEq/L — ABNORMAL LOW (ref 135–145)

## 2012-06-07 LAB — CBC WITH DIFFERENTIAL/PLATELET
Basophils Relative: 0.6 % (ref 0.0–3.0)
HCT: 43.2 % (ref 39.0–52.0)
Hemoglobin: 14.8 g/dL (ref 13.0–17.0)
Lymphocytes Relative: 16.3 % (ref 12.0–46.0)
MCHC: 34.2 g/dL (ref 30.0–36.0)
Monocytes Relative: 10.7 % (ref 3.0–12.0)
Neutro Abs: 4.5 10*3/uL (ref 1.4–7.7)
RBC: 4.55 Mil/uL (ref 4.22–5.81)

## 2012-06-07 LAB — MICROALBUMIN / CREATININE URINE RATIO: Creatinine,U: 153.1 mg/dL

## 2012-06-07 LAB — HEPATIC FUNCTION PANEL
Alkaline Phosphatase: 52 U/L (ref 39–117)
Bilirubin, Direct: 0.3 mg/dL (ref 0.0–0.3)
Total Bilirubin: 1.2 mg/dL (ref 0.3–1.2)
Total Protein: 8.1 g/dL (ref 6.0–8.3)

## 2012-06-07 LAB — PSA, MEDICARE: PSA: 0.2 ng/ml (ref 0.10–4.00)

## 2012-06-10 ENCOUNTER — Telehealth: Payer: Self-pay | Admitting: Internal Medicine

## 2012-06-10 MED ORDER — GLIMEPIRIDE 4 MG PO TABS: 8.0000 mg | ORAL_TABLET | Freq: Two times a day (BID) | ORAL | Status: DC

## 2012-06-10 NOTE — Telephone Encounter (Signed)
Rx sent in to pharmacy. 

## 2012-06-10 NOTE — Telephone Encounter (Signed)
glimepiride (AMARYL) 4 MG tablet ° °

## 2012-06-11 ENCOUNTER — Telehealth: Payer: Self-pay | Admitting: Internal Medicine

## 2012-06-11 DIAGNOSIS — I5023 Acute on chronic systolic (congestive) heart failure: Secondary | ICD-10-CM

## 2012-06-11 MED ORDER — DIGOXIN 125 MCG PO TABS: 0.1250 mg | ORAL_TABLET | Freq: Every day | ORAL | Status: DC

## 2012-06-11 MED ORDER — GLIMEPIRIDE 4 MG PO TABS: ORAL_TABLET | ORAL | Status: DC

## 2012-06-11 NOTE — Telephone Encounter (Signed)
Order changed to amaryl 4mg  (2 per day).  Did not sent to pharmacy. Just corrected.  Had already been sent to pharmacy

## 2012-06-12 ENCOUNTER — Other Ambulatory Visit: Payer: Self-pay | Admitting: *Deleted

## 2012-06-12 MED ORDER — FUROSEMIDE 40 MG PO TABS: 60.0000 mg | ORAL_TABLET | Freq: Two times a day (BID) | ORAL | Status: DC

## 2012-07-02 ENCOUNTER — Ambulatory Visit (INDEPENDENT_AMBULATORY_CARE_PROVIDER_SITE_OTHER): Payer: Medicare Other

## 2012-07-02 ENCOUNTER — Ambulatory Visit (INDEPENDENT_AMBULATORY_CARE_PROVIDER_SITE_OTHER): Payer: Medicare Other | Admitting: Internal Medicine

## 2012-07-02 ENCOUNTER — Ambulatory Visit (INDEPENDENT_AMBULATORY_CARE_PROVIDER_SITE_OTHER)
Admission: RE | Admit: 2012-07-02 | Discharge: 2012-07-02 | Disposition: A | Discharge status: Home / Self Care | Payer: Medicare Other | Source: Ambulatory Visit | Attending: Internal Medicine | Admitting: Internal Medicine

## 2012-07-02 ENCOUNTER — Encounter: Payer: Self-pay | Admitting: Internal Medicine

## 2012-07-02 VITALS — BP 110/58 | HR 70 | Temp 98.4°F | Resp 18 | Wt 177.0 lb

## 2012-07-02 VITALS — BP 110/60 | HR 69 | Ht 69.0 in | Wt 178.0 lb

## 2012-07-02 DIAGNOSIS — R05 Cough: Secondary | ICD-10-CM

## 2012-07-02 DIAGNOSIS — R634 Abnormal weight loss: Secondary | ICD-10-CM

## 2012-07-02 DIAGNOSIS — E860 Dehydration: Secondary | ICD-10-CM

## 2012-07-02 DIAGNOSIS — R5381 Other malaise: Secondary | ICD-10-CM

## 2012-07-02 DIAGNOSIS — R0601 Orthopnea: Secondary | ICD-10-CM

## 2012-07-02 DIAGNOSIS — R0989 Other specified symptoms and signs involving the circulatory and respiratory systems: Secondary | ICD-10-CM

## 2012-07-02 DIAGNOSIS — I509 Heart failure, unspecified: Secondary | ICD-10-CM

## 2012-07-02 LAB — BASIC METABOLIC PANEL
BUN: 21 mg/dL (ref 6–23)
CO2: 29 mEq/L (ref 19–32)
Calcium: 8.6 mg/dL (ref 8.4–10.5)
Glucose, Bld: 314 mg/dL — ABNORMAL HIGH (ref 70–99)
Sodium: 134 mEq/L — ABNORMAL LOW (ref 135–145)

## 2012-07-02 LAB — CBC WITH DIFFERENTIAL/PLATELET
Basophils Absolute: 0 10*3/uL (ref 0.0–0.1)
Basophils Relative: 0.3 % (ref 0.0–3.0)
Eosinophils Absolute: 0 10*3/uL (ref 0.0–0.7)
HCT: 35.3 % — ABNORMAL LOW (ref 39.0–52.0)
Hemoglobin: 12.4 g/dL — ABNORMAL LOW (ref 13.0–17.0)
Lymphocytes Relative: 10.9 % — ABNORMAL LOW (ref 12.0–46.0)
MCHC: 35 g/dL (ref 30.0–36.0)
Monocytes Absolute: 0.5 10*3/uL (ref 0.1–1.0)
Monocytes Relative: 6.7 % (ref 3.0–12.0)
Neutro Abs: 5.9 10*3/uL (ref 1.4–7.7)
RBC: 3.76 Mil/uL — ABNORMAL LOW (ref 4.22–5.81)

## 2012-07-02 MED ORDER — BENZONATATE 200 MG PO CAPS: 200.0000 mg | ORAL_CAPSULE | Freq: Two times a day (BID) | ORAL | Status: DC | PRN

## 2012-07-02 NOTE — Progress Notes (Signed)
Pt walked in office w/ complaints of chills, cough x 2 weeks. Also c/o chest "burning" with cough.   Brought back to exam room for EKG EKG performed, appears unchanged from previous EKG, shows V paced rhythm Lung sounds are diminished throughout with rales heard in RLL  I advised pt to see PCP today ASAP to r/o pna or bronchitis He has been taking OTC cough meds which have not helped his coughing  He tells me he has zaroxolyn he took this morning "in case it was fluid around my heart", but weight has remained stable since last OV I explained this may not be related to fluid, rather may be r/t some lung issue and should see PCP Dr. Mariah Milling still at hospital and unable to see pt at this time I called PCP office and made appt for pt to be seen this am at 0930 with Dr. Darrick Huntsman Pt verb understanding and will go there now  Pt mentions when leaving office he has been having bilateral leg pain since resuming simvastatin. Says this happened in past. I advised him to hold this a few days to see if this improved and to let us know Understanding verb

## 2012-07-02 NOTE — Patient Instructions (Addendum)
Please go to [facility] for you chest x ray.  I will send medication to your pharmacy  Your diabetes is uncontrolled.  Please stop eating so many starchy foods and  Check your blood sugars 2 hours after different meals and bring those results with you to your appt with Dr. Lorin Picket

## 2012-07-02 NOTE — Progress Notes (Signed)
Patient ID: Corey [name], male   DOB: 23-Apr-1935, 77 y.o.   MRN: [id number]     Patient Active Problem List   Diagnosis Date Noted  . Cough 07/03/2012  . ICD-[employer] 12/20/2011  . GERD (gastroesophageal reflux disease) 09/30/2011  . Constipation 09/29/2011  . Nausea 09/26/2011  . Acute on chronic systolic heart failure 09/26/2011  . Encounter for long-term (current) use of anticoagulants 09/04/2011  . Anginal chest pain at rest 18-Sep-2011  . CAD (coronary artery disease) 08/14/2011  . NSTEMI (non-ST elevated myocardial infarction) 08/14/2011  . Hypertension 08/14/2011  . Weight loss 01/30/2011  . Chronic systolic heart failure 08/25/2009  . DYSURIA 08/26/2008  . CAD, ARTERY BYPASS GRAFT 07/22/2008  . DIABETES MELLITUS 07/11/2008  . HYPERLIPIDEMIA-MIXED 07/11/2008  . CARDIOMYOPATHY, ISCHEMIC 07/11/2008  . Atrial fibrillation 07/11/2008  . RENAL INSUFFICIENCY 07/11/2008  . ICD - IN SITU 07/11/2008    Subjective:  CC:   Chief Complaint  Patient presents with  . Follow-up    cough X 4 weeks dry cough no fever or chills, felt like pens and needle running over body X1    HPI:   Corey [name] a 77 y.o. male who presents for evaluation of Cough and malaise for 4 weeks.  He denies history of sick contacts or travel.  He has been having stomach rumbling , nauseated and anorexia for 3 weeks.  He denies  diarrhea or vomiting, and has been constipated chronically,  dependent on laxatives since last July for AMI. Has lost 6 pounds since December.  He has had anorexia, nothing tastes  Good.  His cough is nonproductive. Only rare sputum,  No fevers,  X smoker, quit in 1987 after first MI. Occasional shooting pains in chest wall ,  But episodic.  Takes furosemide  twice daily  But reports shortness of breath since July with minimal exertion . Some orthopnea, which is chronic but sleeps on one pillow.  Takes claritin for allergies,   No ace inhihbitor,  No reflux,  But belching a  lot with stomach acting ,  No blood in stools or history of tarry stools.   His  a1c was recently 11.5  And he insists that his fastings are < 150.  He refuses insulin and Januvia.    Because of past hypoglycemic events  with insulin and fear of side effects that he has read about with Januvia.       Past Medical History  Diagnosis Date  . Atrial fibrillation     s/p ablation, on tikosyn  . CAD, ARTERY BYPASS GRAFT     4v CABG w/ redo '06; cath 08/14/11 high grade lesion in OM graft, total occlusion of SVG to RCA with collaterals,  patent  LIMA to LAD,occlusion of SVG to Diagonal, and severe segmental LV systolic dysfunction, EF 20%   . CARDIOMYOPATHY, ISCHEMIC   . Chronic systolic heart failure   . Dysuria   . HYPERLIPIDEMIA-MIXED   . RENAL INSUFFICIENCY   . Mitral regurgitation      s/p MV repair '06  . HTN (hypertension)   . COPD (chronic obstructive pulmonary disease)   . Complication of anesthesia     "liked to went crazy w/the mess they gave me; started w/an  A"  . CHF (congestive heart failure)   . Myocardial infarction 1987; ~ 08/17/11  . Anginal pain   . Pacemaker 09-18-11    "battery is dead; it doesn't work"  . ICD (implantable cardiac defibrillator) in place  08/24/11    "battery is dead; it doesn't work"  . Pneumonia     h/o "walking pneumonia; long time ago"  . Shortness of breath 08/24/11    "all the time"  . Type II diabetes mellitus   . Arthritis     "in the neck after neck OR"    Past Surgical History  Procedure Laterality Date  . Mitral valve repair      with an angioplasty ring  . Cervical discectomy      C3-C4  . Cardiac defibrillator placement  ~ 2007    [employer]ant  . Coronary artery bypass graft  1995    CABG X 2  . Coronary artery bypass graft  2002    CABG "& fixed hole in back of the heart"  . Coronary angioplasty with stent placement      "2 I think"  . Inguinal hernia repair      bilaterally  . Excisional hemorrhoidectomy    . Anterior fusion  cervical spine      "went down in my throat to do it"  . Tee without cardioversion  08/30/2011    Procedure: TRANSESOPHAGEAL ECHOCARDIOGRAM (TEE);  Surgeon: Dolores Patty, MD;  Location: [facility] ENDOSCOPY;  Service: Cardiovascular;  Laterality: N/A;  . Cardioversion  08/30/2011    Procedure: CARDIOVERSION;  Surgeon: Dolores Patty, MD;  Location: [facility] ENDOSCOPY;  Service: Cardiovascular;  Laterality: N/A;  . Mass biopsy  2013    nasal eposcopic       The following portions of the patient's history were reviewed and updated as appropriate: Allergies, current medications, and problem list.    Review of Systems:   Patient denies headache, fevers, skin rash, eye pain, sinus congestion and sinus pain, sore throat, dysphagia,  hemoptysis , cough, dyspnea, wheezing, chest pain, palpitations, orthopnea, edema, abdominal pain, nausea, melena, diarrhea, constipation, flank pain, dysuria, hematuria, urinary  Frequency, nocturia, numbness, tingling, seizures,  Focal weakness, Loss of consciousness,  Tremor, insomnia, depression, anxiety, and suicidal ideation.        History   Social History  . Marital Status: Widowed    Spouse Name: N/A    Number of Children: N/A  . Years of Education: N/A   Occupational History  . Not on file.   Social History Main Topics  . Smoking status: Former Smoker -- 1.50 packs/day for 40 years    Types: Cigarettes, Pipe    Quit date: 03/06/1985  . Smokeless tobacco: Former Neurosurgeon    Types: Snuff, Dorna Bloom    Quit date: 03/06/1958     Comment: "quit chewing and dipping in my early 20's"  . Alcohol Use: No     Comment: 08/24/11 "last alcohol 15 years ago or better"  . Drug Use: No  . Sexually Active: No   Other Topics Concern  . Not on file   Social History Narrative  . No narrative on file    Objective:  BP 110/58  Pulse 70  Temp(Src) 98.4 F (36.9 C) (Oral)  Resp 18  Wt 177 lb (80.287 kg)  BMI 26.13 kg/m2  SpO2 98%  General appearance: alert,  cooperative and appears stated age Ears: normal TM's and external ear canals both ears Throat: lips, mucosa, and tongue normal; teeth and gums normal Neck: no adenopathy, no carotid bruit, supple, symmetrical, trachea midline and thyroid not enlarged, symmetric, no tenderness/mass/nodules Back: symmetric, no curvature. ROM normal. No CVA tenderness. Lungs: clear to auscultation bilaterally Heart: regular rate and rhythm, S1, S2  normal, no murmur, click, rub or gallop Abdomen: soft, non-tender; bowel sounds normal; no masses,  no organomegaly Pulses: 2+ and symmetric Skin: Skin color, texture, turgor normal. No rashes or lesions Lymph nodes: Cervical, supraclavicular, and axillary nodes normal.  Assessment and Plan:  Weight loss Accompanied by malaise.  Chest x ray was normal, I suggested that his symptoms were due to uncontrolled diabetes and recommended tighter control with additional medications but he has refused. He will follow up with Dr. Lorin Picket  In the next two weeks to discuss .   Cough With a normal chest x ray and no use of ACE Inhibitors, etiology includes worsening GERD, COPD and PND.  Recommended benadryl for PND, and tessalon perles for cough.  Consider PFTS and medication for GERD if no improvement in 2 weeks.     Updated Medication List Outpatient Encounter Prescriptions as of 07/02/2012  Medication Sig Dispense Refill  . acetaminophen (TYLENOL) 500 MG tablet Take 500 mg by mouth every 4 (four) hours as needed. For pain      . aspirin 81 MG tablet Take 81 mg by mouth daily.       . digoxin (LANOXIN) 0.125 MG tablet Take 1 tablet (0.125 mg total) by mouth daily.  90 tablet  3  . furosemide (LASIX) 40 MG tablet Take 1.5 tablets (60 mg total) by mouth 2 (two) times daily.  270 tablet  3  . glimepiride (AMARYL) 4 MG tablet 2 tablets q day  60 tablet  1  . isosorbide mononitrate (IMDUR) 60 MG 24 hr tablet TAKE 1 TABLET (60 MG TOTAL) BY MOUTH DAILY.  30 tablet  3  . metFORMIN  (GLUCOPHAGE) 500 MG tablet Take 500 mg by mouth daily with breakfast.      . metolazone (ZAROXOLYN) 5 MG tablet Take 1 tablet (5 mg total) by mouth daily as needed.  30 tablet  6  . metoprolol (LOPRESSOR) 50 MG tablet Take 25 mg by mouth daily.      . nitroGLYCERIN (NITROSTAT) 0.4 MG SL tablet Place 0.4 mg under the tongue every 5 (five) minutes x 3 doses as needed. For chest pain      . potassium chloride SA (K-DUR,KLOR-CON) 20 MEQ tablet Take 20 mEq by mouth daily.      . triazolam (HALCION) 0.25 MG tablet Take 0.25 mg by mouth at bedtime as needed. For sleep      . benzonatate (TESSALON) 200 MG capsule Take 1 capsule (200 mg total) by mouth 2 (two) times daily as needed for cough.  30 capsule  1  . simvastatin (ZOCOR) 20 MG tablet Take 1 tablet (20 mg total) by mouth at bedtime.  30 tablet  11   No facility-administered encounter medications on file as of 07/02/2012.     Orders Placed This Encounter  Procedures  . DG Chest 2 View  . Basic metabolic panel  . B Nat Peptide  . Bordetella pertussis antibody  . CBC with Differential    No Follow-up on file.

## 2012-07-03 ENCOUNTER — Telehealth: Payer: Self-pay | Admitting: Internal Medicine

## 2012-07-03 ENCOUNTER — Encounter: Payer: Self-pay | Admitting: Internal Medicine

## 2012-07-03 DIAGNOSIS — R05 Cough: Secondary | ICD-10-CM | POA: Insufficient documentation

## 2012-07-03 NOTE — Telephone Encounter (Signed)
Please notify pt that I need to see him earlier for his blood sugar. I can see him on 07/09/12 - 10:15.  Please block the 8:15 spot on this day.  Thanks.  Have him check blood sugars bid to tid and bring with him.

## 2012-07-03 NOTE — Assessment & Plan Note (Signed)
Accompanied by malaise.  Chest x ray was normal, I suggested that his symptoms were due to uncontrolled diabetes and recommended tighter control with additional medications but he has refused. He will follow up with Dr. Lorin Picket  In the next two weeks to discuss .

## 2012-07-03 NOTE — Assessment & Plan Note (Addendum)
With a normal chest x ray and no use of ACE Inhibitors, etiology includes worsening GERD, COPD and PND.  Recommended benadryl for PND, and tessalon perles for cough.  Consider PFTS and medication for GERD if no improvement in 2 weeks.

## 2012-07-03 NOTE — Telephone Encounter (Signed)
Message copied by Charm Barges on Wed Jul 03, 2012  4:29 PM ------      Message from: Henrene Pastor R      Created: Wed Jul 03, 2012  9:29 AM       Notified patient of results patient stated he has appointment with Dr. Lorin Picket on 08/30/12 and ask if this will be okay to keep this appointment? ------

## 2012-07-04 ENCOUNTER — Other Ambulatory Visit: Payer: Self-pay | Admitting: *Deleted

## 2012-07-04 MED ORDER — METFORMIN HCL ER (MOD) 500 MG PO TB24: 500.0000 mg | ORAL_TABLET | Freq: Every day | ORAL | Status: DC

## 2012-07-04 NOTE — Telephone Encounter (Signed)
Pt called requesting a refill on his Metformin since he has switched providers to Dr. Lorin Picket. I spoke with pharmacist & verified that the pt takes Metformin ER 500mg  daily. I corrected his medication list & sent in refill x 6 mths electronically & pt informed. Pt also aware of appt on 5/6 to recheck his blood sugars.

## 2012-07-04 NOTE — Telephone Encounter (Signed)
Spoke with pt & informed him to keep record of blood sugars BID-TID until next appt on 5/6 @ 10:15

## 2012-07-05 LAB — BORDETELLA PERTUSSIS ANTIBODY
B pertussis IgA Ab, Quant: 1.1 U/mL (ref <=1.1)
B pertussis IgM Ab, Quant: 0.2 U/mL (ref <=1.1)

## 2012-07-09 ENCOUNTER — Encounter: Payer: Self-pay | Admitting: Internal Medicine

## 2012-07-09 ENCOUNTER — Ambulatory Visit (INDEPENDENT_AMBULATORY_CARE_PROVIDER_SITE_OTHER): Payer: Medicare Other | Admitting: Internal Medicine

## 2012-07-09 ENCOUNTER — Ambulatory Visit: Payer: Medicare Other | Admitting: Internal Medicine

## 2012-07-09 VITALS — BP 100/80 | HR 70 | Temp 98.3°F | Ht 69.18 in | Wt 173.2 lb

## 2012-07-09 DIAGNOSIS — K219 Gastro-esophageal reflux disease without esophagitis: Secondary | ICD-10-CM

## 2012-07-09 DIAGNOSIS — E119 Type 2 diabetes mellitus without complications: Secondary | ICD-10-CM

## 2012-07-09 DIAGNOSIS — I2510 Atherosclerotic heart disease of native coronary artery without angina pectoris: Secondary | ICD-10-CM

## 2012-07-09 DIAGNOSIS — E785 Hyperlipidemia, unspecified: Secondary | ICD-10-CM

## 2012-07-09 DIAGNOSIS — I5022 Chronic systolic (congestive) heart failure: Secondary | ICD-10-CM

## 2012-07-09 DIAGNOSIS — R109 Unspecified abdominal pain: Secondary | ICD-10-CM

## 2012-07-09 DIAGNOSIS — R110 Nausea: Secondary | ICD-10-CM

## 2012-07-09 DIAGNOSIS — R059 Cough, unspecified: Secondary | ICD-10-CM

## 2012-07-09 DIAGNOSIS — K5900 Constipation, unspecified: Secondary | ICD-10-CM

## 2012-07-09 DIAGNOSIS — R05 Cough: Secondary | ICD-10-CM

## 2012-07-09 DIAGNOSIS — I10 Essential (primary) hypertension: Secondary | ICD-10-CM

## 2012-07-09 DIAGNOSIS — N259 Disorder resulting from impaired renal tubular function, unspecified: Secondary | ICD-10-CM

## 2012-07-09 MED ORDER — METFORMIN HCL 500 MG PO TABS: 500.0000 mg | ORAL_TABLET | Freq: Two times a day (BID) | ORAL | Status: DC

## 2012-07-09 NOTE — Patient Instructions (Signed)
Take your metformin 500mg  - one tablet 2x/day and amaryl 4mg  - one tablet 2x/day.

## 2012-07-14 ENCOUNTER — Encounter: Payer: Self-pay | Admitting: Internal Medicine

## 2012-07-14 NOTE — Assessment & Plan Note (Signed)
Last Cr 1.3.  Stable.  Will increase metformin to bid dosing.  Follow renal function.

## 2012-07-14 NOTE — Assessment & Plan Note (Signed)
Discussed at length with him last visit and this visit.  miralax as directed.  Uses MOM.  May need fleets enema/suppository as needed. Has declined colonoscopy.

## 2012-07-14 NOTE — Assessment & Plan Note (Signed)
Symptoms controlled

## 2012-07-14 NOTE — Assessment & Plan Note (Signed)
Discussed the importance of eating regular meals and appropriate snacks - including a bedtime snack.  Check and record sugars.  Sugars poorly controlled.  Will increase metformin bid and change amaryl to bid.  Follow sugars closely.  Get him back in soon to reassess.  Bring in sugar readings.

## 2012-07-14 NOTE — Assessment & Plan Note (Signed)
Persistent.  Breathing stable.  Follow.

## 2012-07-14 NOTE — Assessment & Plan Note (Signed)
Followed by Dr Gollan. 

## 2012-07-14 NOTE — Assessment & Plan Note (Signed)
Off simvastatin.  Leg pain improved.  Follow cholesterol.       

## 2012-07-14 NOTE — Assessment & Plan Note (Signed)
Has issues with nausea now.  Also experiencing abdominal pain.  Will check CT abdomen and pelvis.  Further w/up pending results.    

## 2012-07-14 NOTE — Progress Notes (Signed)
Subjective:    Patient ID: Corey [name], male    DOB: [birth year], 77 y.o.   MRN: [id number]  HPI 77 year old male with past history of CAD s/p CABG x 2, chronic systolic heart failure, renal insufficiency, hypercholesterolemia, hypertension and diabetes.  He comes in today as a work in to discuss his blood sugars. States this am his sugar was 165.  Has a few readings.  Varying times.  155-255.  Takes amaryl and metformin.  Eats on an average - two meals per day.  Have discussed eating regular meals and a snack before bed.  He reports having problems with increased abdominal pain.  Involves mostly his lower stomach.  Urinating ok.  Persistent problems with constipation.  Some nausea.  No vomiting.  Eating does seem to make the pain worse.  No blood.     Past Medical History  Diagnosis Date  . Atrial fibrillation     s/p ablation, on tikosyn  . CAD, ARTERY BYPASS GRAFT     4v CABG w/ redo '06; cath 08/14/11 high grade lesion in OM graft, total occlusion of SVG to RCA with collaterals,  patent  LIMA to LAD,occlusion of SVG to Diagonal, and severe segmental LV systolic dysfunction, EF 20%   . CARDIOMYOPATHY, ISCHEMIC   . Chronic systolic heart failure   . Dysuria   . HYPERLIPIDEMIA-MIXED   . RENAL INSUFFICIENCY   . Mitral regurgitation      s/p MV repair '06  . HTN (hypertension)   . COPD (chronic obstructive pulmonary disease)   . Complication of anesthesia     "liked to went crazy w/the mess they gave me; started w/an  A"  . CHF (congestive heart failure)   . Myocardial infarction 1987; ~ 08/17/11  . Anginal pain   . Pacemaker 13-Sep-2011    "battery is dead; it doesn't work"  . ICD (implantable cardiac defibrillator) in place 09-13-11    "battery is dead; it doesn't work"  . Pneumonia     h/o "walking pneumonia; long time ago"  . Shortness of breath 09/13/2011    "all the time"  . Type II diabetes mellitus   . Arthritis     "in the neck after neck OR"     Outpatient Encounter  Prescriptions as of 07/09/2012  Medication Sig Dispense Refill  . acetaminophen (TYLENOL) 500 MG tablet Take 500 mg by mouth every 4 (four) hours as needed. For pain      . aspirin 81 MG tablet Take 81 mg by mouth daily.       . benzonatate (TESSALON) 200 MG capsule Take 1 capsule (200 mg total) by mouth 2 (two) times daily as needed for cough.  30 capsule  1  . digoxin (LANOXIN) 0.125 MG tablet Take 1 tablet (0.125 mg total) by mouth daily.  90 tablet  3  . furosemide (LASIX) 40 MG tablet Take 1.5 tablets (60 mg total) by mouth 2 (two) times daily.  270 tablet  3  . glimepiride (AMARYL) 4 MG tablet 2 tablets q day  60 tablet  1  . isosorbide mononitrate (IMDUR) 60 MG 24 hr tablet TAKE 1 TABLET (60 MG TOTAL) BY MOUTH DAILY.  30 tablet  3  . metolazone (ZAROXOLYN) 5 MG tablet Take 1 tablet (5 mg total) by mouth daily as needed.  30 tablet  6  . metoprolol (LOPRESSOR) 50 MG tablet Take 25 mg by mouth daily.      . nitroGLYCERIN (NITROSTAT) 0.4 MG  SL tablet Place 0.4 mg under the tongue every 5 (five) minutes x 3 doses as needed. For chest pain      . potassium chloride SA (K-DUR,KLOR-CON) 20 MEQ tablet Take 20 mEq by mouth daily.      . simvastatin (ZOCOR) 20 MG tablet Take 1 tablet (20 mg total) by mouth at bedtime.  30 tablet  11  . triazolam (HALCION) 0.25 MG tablet Take 0.25 mg by mouth at bedtime as needed. For sleep      . [DISCONTINUED] metFORMIN (GLUMETZA) 500 MG (MOD) 24 hr tablet Take 1 tablet (500 mg total) by mouth daily with breakfast.  30 tablet  5  . metFORMIN (GLUCOPHAGE) 500 MG tablet Take 1 tablet (500 mg total) by mouth 2 (two) times daily with a meal.  60 tablet  3   No facility-administered encounter medications on file as of 07/09/2012.    Review of Systems Patient denies any headache, lightheadedness or dizziness. No sinus or allergy symptoms.   No chest pain, tightness or palpitations currently.  Gets sob with exertion.  Has to sit and rest.  Breathing appears to be stable.  Did see Dr Darrick Huntsman recently for cough.  Refer to her note for details.  Still with some cough.  No BRBPR or melana.  Does have issues with constipation.  See above.  No urine change.  Lower abdominal pain as outlined. Nausea.  Stopped taking simvastatin.  Legs better.       Objective:   Physical Exam  Filed Vitals:   07/09/12 1150  BP: 100/80  Pulse: 70  Temp: 98.3 F (36.8 C)   pulse 56  77 year old male in no acute distress.  HEENT:  Nares - clear.  Oropharynx - without lesions. NECK:  Supple.  Nontender.  No audible carotid bruit.  HEART:  Appears to be regular.   LUNGS:  No crackles or wheezing audible.  Respirations even and unlabored.   RADIAL PULSE:  Equal bilaterally.  ABDOMEN:  Soft.  Tenderness to palpation over the lower abdomen.  Bowel sounds present and normal.  No audible abdominal bruit.  No rebound or guarding.  EXTREMITIES:  No increased edema present.  Stable.         Assessment & Plan:  COUGH.  Appears to be improved.  Follow.    HEALTH MAINTENANCE.  Scheduled a physical for next visit.  Obtain outside records for review.  Has never had a colonoscopy.  We have discussed colonoscopy.  Has desired to hold off on pursuing a colonoscopy.

## 2012-07-14 NOTE — Assessment & Plan Note (Signed)
Sees Dr Mariah Milling.  Takes additional lasix prn.  Follows weight.

## 2012-07-14 NOTE — Assessment & Plan Note (Signed)
Blood pressure under good control.  Follow.   

## 2012-07-19 ENCOUNTER — Telehealth: Payer: Self-pay | Admitting: Emergency Medicine

## 2012-07-19 NOTE — Telephone Encounter (Signed)
noted 

## 2012-07-19 NOTE — Telephone Encounter (Signed)
Called patient to make him aware of the new CT apt date/time. He states he doesn't want this test any longer because he feels better. I informed the pt to call us back if he starts feeling bad again.

## 2012-07-30 ENCOUNTER — Ambulatory Visit (INDEPENDENT_AMBULATORY_CARE_PROVIDER_SITE_OTHER): Payer: Medicare Other | Admitting: Internal Medicine

## 2012-07-30 ENCOUNTER — Encounter: Payer: Self-pay | Admitting: Emergency Medicine

## 2012-07-30 ENCOUNTER — Encounter: Payer: Self-pay | Admitting: Internal Medicine

## 2012-07-30 VITALS — BP 110/70 | HR 70 | Temp 98.5°F | Ht 69.18 in | Wt 172.5 lb

## 2012-07-30 DIAGNOSIS — R110 Nausea: Secondary | ICD-10-CM

## 2012-07-30 DIAGNOSIS — I2510 Atherosclerotic heart disease of native coronary artery without angina pectoris: Secondary | ICD-10-CM

## 2012-07-30 DIAGNOSIS — K5900 Constipation, unspecified: Secondary | ICD-10-CM

## 2012-07-30 DIAGNOSIS — I4891 Unspecified atrial fibrillation: Secondary | ICD-10-CM

## 2012-07-30 DIAGNOSIS — E119 Type 2 diabetes mellitus without complications: Secondary | ICD-10-CM

## 2012-07-30 DIAGNOSIS — E785 Hyperlipidemia, unspecified: Secondary | ICD-10-CM

## 2012-07-30 DIAGNOSIS — R109 Unspecified abdominal pain: Secondary | ICD-10-CM

## 2012-07-30 DIAGNOSIS — I10 Essential (primary) hypertension: Secondary | ICD-10-CM

## 2012-07-30 DIAGNOSIS — N259 Disorder resulting from impaired renal tubular function, unspecified: Secondary | ICD-10-CM

## 2012-07-30 DIAGNOSIS — K219 Gastro-esophageal reflux disease without esophagitis: Secondary | ICD-10-CM

## 2012-07-30 NOTE — Progress Notes (Signed)
Subjective:    Patient ID: Corey [name], male    DOB: 12-01-1935, 77 y.o.   MRN: [id number]  HPI 77 year old male with past history of CAD s/p CABG x 2, chronic systolic heart failure, renal insufficiency, hypercholesterolemia, hypertension and diabetes.  He comes in today for a scheduled follow up.  He reports his sugars are doing better.  Brings in some recorded readings.  AM sugars running 93-130.  PM sugars 146-180.   Eats on an average - two meals per day.  Have discussed eating regular meals and a snack before bed.  He is still having problems with increased abdominal pain.  Involves mostly his lower stomach.  Urinating ok.  Persistent problems with constipation.  Some nausea.  No vomiting.  Eating does seem to make the pain worse.  No blood.  He states it feels like his stomach is "rolling".  Describes stomach spasm.  Feels "nervous" in the top of his stomach.  He cancelled the CT scan.  Discussed at length with him today.  Explained needed further testing to determine the etiology of his symptoms.  Breathing appears to be stable.  Continues to follow up with Dr Mariah Milling.    Past Medical History  Diagnosis Date  . Atrial fibrillation     s/p ablation, on tikosyn  . CAD, ARTERY BYPASS GRAFT     4v CABG w/ redo '06; cath 08/14/11 high grade lesion in OM graft, total occlusion of SVG to RCA with collaterals,  patent  LIMA to LAD,occlusion of SVG to Diagonal, and severe segmental LV systolic dysfunction, EF 20%   . CARDIOMYOPATHY, ISCHEMIC   . Chronic systolic heart failure   . Dysuria   . HYPERLIPIDEMIA-MIXED   . RENAL INSUFFICIENCY   . Mitral regurgitation      s/p MV repair '06  . HTN (hypertension)   . COPD (chronic obstructive pulmonary disease)   . Complication of anesthesia     "liked to went crazy w/the mess they gave me; started w/an  A"  . CHF (congestive heart failure)   . Myocardial infarction 1987; ~ 08/17/11  . Anginal pain   . Pacemaker 09-08-11    "battery is dead; it  doesn't work"  . ICD (implantable cardiac defibrillator) in place 09/08/11    "battery is dead; it doesn't work"  . Pneumonia     h/o "walking pneumonia; long time ago"  . Shortness of breath 2011-09-08    "all the time"  . Type II diabetes mellitus   . Arthritis     "in the neck after neck OR"     Outpatient Encounter Prescriptions as of 07/30/2012  Medication Sig Dispense Refill  . acetaminophen (TYLENOL) 500 MG tablet Take 500 mg by mouth every 4 (four) hours as needed. For pain      . aspirin 81 MG tablet Take 81 mg by mouth daily.       . digoxin (LANOXIN) 0.125 MG tablet Take 1 tablet (0.125 mg total) by mouth daily.  90 tablet  3  . furosemide (LASIX) 40 MG tablet Take 1.5 tablets (60 mg total) by mouth 2 (two) times daily.  270 tablet  3  . glimepiride (AMARYL) 4 MG tablet 2 tablets q day  60 tablet  1  . isosorbide mononitrate (IMDUR) 60 MG 24 hr tablet TAKE 1 TABLET (60 MG TOTAL) BY MOUTH DAILY.  30 tablet  3  . metFORMIN (GLUCOPHAGE) 500 MG tablet Take 1 tablet (500 mg total) by  mouth 2 (two) times daily with a meal.  60 tablet  3  . metolazone (ZAROXOLYN) 5 MG tablet Take 1 tablet (5 mg total) by mouth daily as needed.  30 tablet  6  . metoprolol (LOPRESSOR) 50 MG tablet Take 25 mg by mouth daily.      . nitroGLYCERIN (NITROSTAT) 0.4 MG SL tablet Place 0.4 mg under the tongue every 5 (five) minutes x 3 doses as needed. For chest pain      . potassium chloride SA (K-DUR,KLOR-CON) 20 MEQ tablet Take 20 mEq by mouth daily.      . triazolam (HALCION) 0.25 MG tablet Take 0.25 mg by mouth at bedtime as needed. For sleep      . benzonatate (TESSALON) 200 MG capsule Take 1 capsule (200 mg total) by mouth 2 (two) times daily as needed for cough.  30 capsule  1  . simvastatin (ZOCOR) 20 MG tablet Take 1 tablet (20 mg total) by mouth at bedtime.  30 tablet  11   No facility-administered encounter medications on file as of 07/30/2012.    Review of Systems Patient denies any headache,  lightheadedness or dizziness. No sinus or allergy symptoms.   No chest pain, tightness or palpitations currently.  Gets sob with exertion.  Has to sit and rest.  Breathing appears to be stable.  No BRBPR or melana.  Does have issues with constipation.  See above.  No urine change.  Lower abdominal pain as outlined.  Nausea.  Sugars appear to be better.  See above.        Objective:   Physical Exam  Filed Vitals:   07/30/12 0803  BP: 110/70  Pulse: 70  Temp: 98.5 F (71.28 C)   77 year old male in no acute distress.  HEENT:  Nares - clear.  Oropharynx - without lesions. NECK:  Supple.  Nontender.  No audible carotid bruit.  HEART:  Appears to be regular.   LUNGS:  No crackles or wheezing audible.  Respirations even and unlabored.   RADIAL PULSE:  Equal bilaterally.  ABDOMEN:  Soft.  Tenderness to palpation over the lower abdomen.  Bowel sounds present and normal.  No audible abdominal bruit.  No rebound or guarding.  EXTREMITIES:  No increased edema present.  Stable.         Assessment & Plan:  COUGH.  Resolved.      HEALTH MAINTENANCE.  Scheduled a physical for next visit.  Need outside records for review.  Has never had a colonoscopy.  We have discussed colonoscopy.  Discussed again today.  Has desired to hold off on pursuing a colonoscopy.

## 2012-08-05 ENCOUNTER — Ambulatory Visit: Payer: Self-pay | Admitting: Internal Medicine

## 2012-08-07 ENCOUNTER — Telehealth: Payer: Self-pay | Admitting: *Deleted

## 2012-08-07 DIAGNOSIS — R109 Unspecified abdominal pain: Secondary | ICD-10-CM

## 2012-08-07 NOTE — Telephone Encounter (Signed)
Spoke with pt & informed him the his abdominal ct done on 08/05/12 revealed changes c/w constipation. No other acute abnormality. Given persistent Abdominal pain I advised him that Dr Corey [name] would like to refer him to a GI specialist. Pt was willing to see them "one time". I informed him that Amber will call him in a few days with the GI appt.

## 2012-08-08 NOTE — Telephone Encounter (Signed)
Order placed for GI referral.   

## 2012-08-12 ENCOUNTER — Encounter: Payer: Self-pay | Admitting: Internal Medicine

## 2012-08-12 NOTE — Assessment & Plan Note (Signed)
Off simvastatin.  Leg pain improved.  Follow cholesterol.

## 2012-08-12 NOTE — Assessment & Plan Note (Signed)
Blood pressure under good control.  Follow.   

## 2012-08-12 NOTE — Assessment & Plan Note (Signed)
Discussed at length with him last visit and this visit.  He states miralax does not help.  Takes Ex lax and MOM.   May need fleets enema/suppository as needed. Has declined colonoscopy.  Obtain CT as outlined.

## 2012-08-12 NOTE — Assessment & Plan Note (Signed)
Followed by Dr Gollan. 

## 2012-08-12 NOTE — Assessment & Plan Note (Signed)
Has issues with nausea now.  Also experiencing abdominal pain.  Will check CT abdomen and pelvis.  Further w/up pending results.

## 2012-08-12 NOTE — Assessment & Plan Note (Signed)
Last Cr 1.3.  Stable.   Follow renal function.

## 2012-08-12 NOTE — Assessment & Plan Note (Signed)
Stable.  Continues follow up with Dr Gollan.   

## 2012-08-12 NOTE — Assessment & Plan Note (Signed)
Symptoms controlled

## 2012-08-12 NOTE — Assessment & Plan Note (Signed)
Discussed the importance of eating regular meals and appropriate snacks - including a bedtime snack.  Continue to follow.

## 2012-08-15 ENCOUNTER — Ambulatory Visit (INDEPENDENT_AMBULATORY_CARE_PROVIDER_SITE_OTHER): Payer: Medicare Other | Admitting: Cardiovascular Disease

## 2012-08-15 ENCOUNTER — Encounter: Payer: Self-pay | Admitting: Cardiovascular Disease

## 2012-08-15 ENCOUNTER — Encounter: Payer: Self-pay | Admitting: Internal Medicine

## 2012-08-15 VITALS — BP 125/76 | HR 73 | Ht 69.0 in | Wt 172.2 lb

## 2012-08-15 DIAGNOSIS — E119 Type 2 diabetes mellitus without complications: Secondary | ICD-10-CM

## 2012-08-15 DIAGNOSIS — R5381 Other malaise: Secondary | ICD-10-CM

## 2012-08-15 DIAGNOSIS — R142 Eructation: Secondary | ICD-10-CM

## 2012-08-15 DIAGNOSIS — R000 Tachycardia, unspecified: Secondary | ICD-10-CM

## 2012-08-15 DIAGNOSIS — R0602 Shortness of breath: Secondary | ICD-10-CM

## 2012-08-15 DIAGNOSIS — I25810 Atherosclerosis of coronary artery bypass graft(s) without angina pectoris: Secondary | ICD-10-CM

## 2012-08-15 DIAGNOSIS — R141 Gas pain: Secondary | ICD-10-CM

## 2012-08-15 DIAGNOSIS — I5023 Acute on chronic systolic (congestive) heart failure: Secondary | ICD-10-CM

## 2012-08-15 DIAGNOSIS — Z95810 Presence of automatic (implantable) cardiac defibrillator: Secondary | ICD-10-CM

## 2012-08-15 DIAGNOSIS — K5900 Constipation, unspecified: Secondary | ICD-10-CM

## 2012-08-15 DIAGNOSIS — R531 Weakness: Secondary | ICD-10-CM

## 2012-08-15 DIAGNOSIS — R109 Unspecified abdominal pain: Secondary | ICD-10-CM

## 2012-08-15 NOTE — Patient Instructions (Addendum)
You are doing well. No medication changes were made. Take lasix 40 mg twice a day for worsening swelling  Please call us if you have new issues that need to be addressed before your next appt.  Your physician wants you to follow-up in: 6 months.  You will receive a reminder letter in the mail two months in advance. If you don't receive a letter, please call our office to schedule the follow-up appointment.  GI appointment is scheduled for Monday August 19, 2012 @ 3:00 with Dr. Leone Payor.

## 2012-08-15 NOTE — Assessment & Plan Note (Signed)
There was some concern that perhaps he had diaphragmatic stimulation from his pacemaker. Given his GI symptoms, this would be worth looking at. He we'll see EP next week for pacer interrogation. I suspect his symptoms are predominantly GI. He does have PVCs which could be contributing to palpitations.

## 2012-08-15 NOTE — Assessment & Plan Note (Signed)
Periodic shortness of breath improved with Lasix consistent with heart failure. Improved today. Also has shortness of breath with bending over and squatting.  suggested he bend at the knees  when he bends over and squats, don't hold his breath

## 2012-08-15 NOTE — Assessment & Plan Note (Signed)
We have encouraged continued exercise, careful diet management in an effort to lose weight. 

## 2012-08-15 NOTE — Progress Notes (Signed)
Patient ID: Corey [name], male    DOB: 01-Apr-1935, 77 y.o.   MRN: [id number]  HPI Comments: Corey [name] is a 77 year old male  with history of diabetes,  coronary artery disease, bypass surgery 1995, ischemic cardiomyopathy, non-ST elevation MI in June 2013 with medical management recommended his coronary artery disease, ICD /defibrillator for ventricular tachycardia (notes indicating complete heart block), chronic atrial fibrillation,  who presented to the hospital in August 2013 with rash of the lower extremities and volume overload. He was taking Lasix 40 mg twice a day on presentation.  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  He completed  a course of steroids.  Previous  nosebleeds x2  past week trips to be  emergency room twice.  team was placed  He has chosen not to try any other anticoagulants given his  leg condition/vasculitis and nosebleeds.Marland Kitchen  His biggest complaint today is his stomach. He reports in his lower abdomen he has discomfort, tremendous gas almost on a daily basis, associated with this he has leg weakness. He takes ex-lax and milk of magnesia every 4 days to get his bowels going. Sometimes has bowel movement every 5 days. "Worst was in the hospital, took 9 days " Occasionally has fluttering in his chest. Some sensation radiating from his pacemaker to the middle of his chest to the upper part of the stomach. This is more rare. Feels like a flutter or palpitation .   Recently had worsening edema. He is only taking Lasix 20 mg twice a day. He took extra Lasix and occasional metolazone with improvement of his weight, breathing and edema .  States that he has difficulty breathing if he bends over and stands up. Also he squats down and stands up has difficulty breathing and has to hold onto something . Otherwise fine with walking around .   EKG today shows paced rhythm with a rate of 73 beats  per minute, PVC noted  Previously he did not want a cholesterol medication    Outpatient Encounter Prescriptions as of 08/15/2012  Medication Sig Dispense Refill  . acetaminophen (TYLENOL) 500 MG tablet Take 500 mg by mouth every 4 (four) hours as needed. For pain      . aspirin 81 MG tablet Take 81 mg by mouth daily.       . benzonatate (TESSALON) 200 MG capsule Take 1 capsule (200 mg total) by mouth 2 (two) times daily as needed for cough.  30 capsule  1  . digoxin (LANOXIN) 0.125 MG tablet Take 1 tablet (0.125 mg total) by mouth daily.  90 tablet  3  . furosemide (LASIX) 40 MG tablet Take 20 mg by mouth 2 (two) times daily.      Marland Kitchen glimepiride (AMARYL) 4 MG tablet 2 tablets q day  60 tablet  1  . isosorbide mononitrate (IMDUR) 60 MG 24 hr tablet TAKE 1 TABLET (60 MG TOTAL) BY MOUTH DAILY.  30 tablet  3  . metFORMIN (GLUCOPHAGE) 500 MG tablet Take 1 tablet (500 mg total) by mouth 2 (two) times daily with a meal.  60 tablet  3  . metolazone (ZAROXOLYN) 5 MG tablet Take 1 tablet (5 mg total) by mouth daily as needed.  30 tablet  6  . metoprolol (LOPRESSOR) 50 MG tablet Take 50 mg by mouth daily.       . nitroGLYCERIN (NITROSTAT) 0.4 MG SL tablet Place 0.4 mg under  the tongue every 5 (five) minutes x 3 doses as needed. For chest pain      . potassium chloride SA (K-DUR,KLOR-CON) 20 MEQ tablet Take 20 mEq by mouth daily.      . triazolam (HALCION) 0.25 MG tablet Take 0.25 mg by mouth at bedtime as needed. For sleep        Review of Systems  Constitutional: Positive for fatigue.  HENT: Negative.   Eyes: Negative.   Respiratory: Positive for shortness of breath.   Gastrointestinal: Positive for abdominal pain and constipation.  Musculoskeletal: Negative.   Skin: Negative.   Neurological: Negative.   Psychiatric/Behavioral: Negative.   All other systems reviewed and are negative.    BP 125/76  Pulse 73  Ht 5\' 9"  (1.753 m)  Wt 172 lb 4 oz (78.132 kg)  BMI 25.43 kg/m2  Physical Exam   Nursing note and vitals reviewed. Constitutional: He is oriented to person, place, and time. He appears well-developed and well-nourished.  HENT:  Head: Normocephalic.  Nose: Nose normal.  Mouth/Throat: Oropharynx is clear and moist.  Eyes: Conjunctivae are normal. Pupils are equal, round, and reactive to light.  Neck: Normal range of motion. Neck supple. No JVD present.  Cardiovascular: Normal rate, regular rhythm, S1 normal, S2 normal, normal heart sounds and intact distal pulses.  Exam reveals no gallop and no friction rub.   No murmur heard. Pulmonary/Chest: Effort normal and breath sounds normal. No respiratory distress. He has no wheezes. He has no rales. He exhibits no tenderness.  Abdominal: Soft. Bowel sounds are normal. He exhibits no distension. There is no tenderness.  Musculoskeletal: Normal range of motion. He exhibits no edema and no tenderness.  Lymphadenopathy:    He has no cervical adenopathy.  Neurological: He is alert and oriented to person, place, and time. Coordination normal.  Skin: Skin is warm and dry. No rash noted. No erythema.  Deep large wounds have healed, residual scars noted  Psychiatric: He has a normal mood and affect. His behavior is normal. Judgment and thought content normal.      Assessment and Plan

## 2012-08-15 NOTE — Assessment & Plan Note (Signed)
Tremendous GI discomfort, gas. He states this is causing leg weakness and he is miserable. At his request, we will refer him to GI for further evaluation. Suggested he try a laxative on a more regular basis, also regular fiber (Citrucel)

## 2012-08-15 NOTE — Assessment & Plan Note (Signed)
We've recommended that he take Lasix 40 mg twice a day. Also take metolazone as needed for worsening edema or any weight gain

## 2012-08-15 NOTE — Assessment & Plan Note (Signed)
Currently with no symptoms of angina. No further workup at this time. Continue current medication regimen. 

## 2012-08-19 ENCOUNTER — Other Ambulatory Visit: Payer: Self-pay | Admitting: Cardiovascular Disease

## 2012-08-19 ENCOUNTER — Ambulatory Visit (INDEPENDENT_AMBULATORY_CARE_PROVIDER_SITE_OTHER): Payer: Medicare Other | Admitting: Internal Medicine

## 2012-08-19 ENCOUNTER — Encounter: Payer: Self-pay | Admitting: Internal Medicine

## 2012-08-19 VITALS — BP 102/60 | HR 76 | Ht 68.0 in | Wt 174.4 lb

## 2012-08-19 DIAGNOSIS — R198 Other specified symptoms and signs involving the digestive system and abdomen: Secondary | ICD-10-CM

## 2012-08-19 DIAGNOSIS — R194 Change in bowel habit: Secondary | ICD-10-CM

## 2012-08-19 DIAGNOSIS — R1033 Periumbilical pain: Secondary | ICD-10-CM

## 2012-08-19 DIAGNOSIS — K5900 Constipation, unspecified: Secondary | ICD-10-CM

## 2012-08-19 MED ORDER — MOVIPREP 100 G PO SOLR: ORAL | Status: DC

## 2012-08-19 NOTE — Progress Notes (Signed)
Subjective:    Patient ID: Corey [name], male    DOB: [birth year], 77 y.o.   MRN: [id number]  HPI The patient is an elderly man here with approximately a years worth of worsening constipation but over the last few months it's really gotten worse. He is having periumbilical pain that radiates down, borborygmi, what he describes as rolling found her in his belly. He has to take laxatives to defecate. He tried MiraLax using a bottle or more over several weeks without good results, he thinks he felt worse. He has had some chronic constipation but he said last year when he had a heart attack, subsequent to that it's been much worse and significantly worse in the last couple of months. He says he doesn't leave the house much of the time because of the abdominal pain and cramping he has. He has not noticed any bleeding. He has difficulty defecating takes 2 Senokot at night and then in the morning drinks milk of Magnesia and then she will take to suppositories. Sometimes that doesn't work and he has to repeat that and then we'll move his bowels the next day. He gets some relief with defecation. He had a CT scan in [place] which he was told was okay except for constipation and subsequent to the visit we did receive the results which showed moderate amount of stool in the colon, no other acute abnormalities and nothing to explain his symptoms. His other symptoms include nausea and some belching. He has never had a colonoscopy.  Allergies  Allergen Reactions  . Coumadin (Warfarin Sodium) Hives    Pt states he broke out all over his body even the bottom of his feet  . Lorazepam Other (See Comments)    "became unresponsive"  . Codeine Other (See Comments)    "made skin crawl"   Outpatient Prescriptions Prior to Visit  Medication Sig Dispense Refill  . acetaminophen (TYLENOL) 500 MG tablet Take 500 mg by mouth every 4 (four) hours as needed. For pain      . aspirin 81 MG tablet Take 81 mg by mouth daily.        . benzonatate (TESSALON) 200 MG capsule Take 1 capsule (200 mg total) by mouth 2 (two) times daily as needed for cough.  30 capsule  1  . digoxin (LANOXIN) 0.125 MG tablet Take 1 tablet (0.125 mg total) by mouth daily.  90 tablet  3  . furosemide (LASIX) 40 MG tablet Take 20 mg by mouth 2 (two) times daily.      Marland Kitchen glimepiride (AMARYL) 4 MG tablet 2 tablets q day  60 tablet  1  . metFORMIN (GLUCOPHAGE) 500 MG tablet Take 1 tablet (500 mg total) by mouth 2 (two) times daily with a meal.  60 tablet  3  . metolazone (ZAROXOLYN) 5 MG tablet Take 1 tablet (5 mg total) by mouth daily as needed.  30 tablet  6  . metoprolol (LOPRESSOR) 50 MG tablet Take 50 mg by mouth daily.       . nitroGLYCERIN (NITROSTAT) 0.4 MG SL tablet Place 0.4 mg under the tongue every 5 (five) minutes x 3 doses as needed. For chest pain      . potassium chloride SA (K-DUR,KLOR-CON) 20 MEQ tablet Take 20 mEq by mouth daily.      . triazolam (HALCION) 0.25 MG tablet Take 0.25 mg by mouth at bedtime as needed. For sleep       No facility-administered medications prior to visit.  Past Medical History  Diagnosis Date  . Atrial fibrillation     s/p ablation, on tikosyn  . CAD, ARTERY BYPASS GRAFT     4v CABG w/ redo '06; cath 08/14/11 high grade lesion in OM graft, total occlusion of SVG to RCA with collaterals,  patent  LIMA to LAD,occlusion of SVG to Diagonal, and severe segmental LV systolic dysfunction, EF 20%   . CARDIOMYOPATHY, ISCHEMIC   . Chronic systolic heart failure   . Dysuria   . HYPERLIPIDEMIA-MIXED   . RENAL INSUFFICIENCY   . Mitral regurgitation      s/p MV repair '06  . HTN (hypertension)   . COPD (chronic obstructive pulmonary disease)   . Complication of anesthesia     "liked to went crazy w/the mess they gave me; started w/an  A"  . CHF (congestive heart failure)   . Myocardial infarction 1987; ~ 08/17/11  . Anginal pain   . Pacemaker 08/24/11  . ICD (implantable cardiac defibrillator) in place  08/24/11  . Pneumonia     h/o "walking pneumonia; long time ago"  . Shortness of breath 08/24/11    "all the time"  . Type II diabetes mellitus   . Arthritis     "in the neck after neck OR"   Past Surgical History  Procedure Laterality Date  . Mitral valve repair      with an angioplasty ring  . Cervical discectomy      C3-C4  . Cardiac defibrillator placement  ~ 2007    Guidant  . Coronary artery bypass graft  1995    CABG X 2  . Coronary artery bypass graft  2002    CABG "& fixed hole in back of the heart"  . Coronary angioplasty with stent placement      "2 I think"  . Inguinal hernia repair Bilateral   . Excisional hemorrhoidectomy    . Anterior fusion cervical spine      "went down in my throat to do it"  . Tee without cardioversion  08/30/2011    Procedure: TRANSESOPHAGEAL ECHOCARDIOGRAM (TEE);  Surgeon: Dolores Patty, MD;  Location: [facility] ENDOSCOPY;  Service: Cardiovascular;  Laterality: N/A;  . Cardioversion  08/30/2011    Procedure: CARDIOVERSION;  Surgeon: Dolores Patty, MD;  Location: [facility] ENDOSCOPY;  Service: Cardiovascular;  Laterality: N/A;  . Mass biopsy  2013    nasal eposcopic   History   Social History  . Marital Status: Widowed    Spouse Name: N/A    Number of Children: 2    Social History Main Topics  . Smoking status: Former Smoker -- 1.50 packs/day for 40 years    Types: Cigarettes, Pipe    Quit date: 03/06/1985  . Smokeless tobacco: Former Neurosurgeon    Types: Snuff, Dorna Bloom    Quit date: 03/06/1958     Comment: "quit chewing and dipping in my early 20's"  . Alcohol Use: No     Comment: 08/24/11 "last alcohol 15 years ago or better"  . Drug Use: No  . Sexually Active: No   Other Topics Concern  . None   Social History Narrative   The patient is single, says he has 2 sons.   He is retired.   Family History  Problem Relation Age of Onset  . Hypertension Mother   . Skin cancer Mother   . Heart disease Brother     x 3  . Leukemia Sister      Review of Systems  As per history of present illness, his blood sugars have been markedly increased some lately as well, he said dyspnea joint pains insomnia pedal edema and muscle pains. All other review of systems negative.    Objective:   Physical Exam General:  Chronically ill and in no acute distress Eyes:  anicteric. ENT:   Mouth and pharynx clear Neck:   supple w/o thyromegaly or mass.  Lungs: Clear to auscultation bilaterally. Heart:  S1S2, no rubs, murmurs, gallops. Abdomen:  soft, non-tender, no hepatosplenomegaly, hernia, or mass and BS+.  Rectal: Soft brown heme negative stool, no mass, anoderm ok Lymph:  no cervical or supraclavicular adenopathy. Extremities:   no edema Skin   no rash. But scarring in LE's, some fresh traumatic shin ulcers, senile purpura Neuro:  A&O x 3.  Psych:  appropriate mood and  Affect.   Data Reviewed: Lab Results  Component Value Date   WBC 7.2 07/02/2012   HGB 12.4* 07/02/2012   HCT 35.3* 07/02/2012   MCV 93.9 07/02/2012   PLT 190.0 07/02/2012     Chemistry      Component Value Date/Time   NA 134* 07/02/2012 1019   NA 139 02/16/2012 0941   K 4.0 07/02/2012 1019   CL 96 07/02/2012 1019   CO2 29 07/02/2012 1019   BUN 21 07/02/2012 1019   BUN 28* 02/16/2012 0941   CREATININE 1.3 07/02/2012 1019      Component Value Date/Time   CALCIUM 8.6 07/02/2012 1019   ALKPHOS 52 06/07/2012 0852   AST 24 06/07/2012 0852   ALT 15 06/07/2012 0852   BILITOT 1.2 06/07/2012 0852     Lab Results  Component Value Date   TSH 4.72 06/07/2012      Assessment & Plan:  Change in bowel habits -   Unspecified constipation  Periumbilical abdominal pain  Cause of his problems are not entirely clear. However he's had worsening constipation problems and is never had a colonoscopy so that's a reasonable thing to do. It could be that is poorly controlled diabetes is the source of most or all of this. But as discussed with the patient we should rule out colorectal  neoplasia or other problems. Symptomatic diverticulosis is a possibility as well.  He will continue with his current regimen, he has failed MiraLax.  The risks and benefits as well as alternatives of endoscopic procedure(s) have been discussed and reviewed. All questions answered. The patient agrees to proceed.  Given his cardiac comorbidities including a defibrillator with an ejection fraction less than 35% it would be safest to have him perform his procedure at the hospital and we will do this on June 26.  YN:[id number] S, MD Dossie Arbour, MD

## 2012-08-19 NOTE — Telephone Encounter (Signed)
Refilled Isosorbide sent to [facility] pharmacy.

## 2012-08-19 NOTE — Patient Instructions (Addendum)
You have been scheduled for a colonoscopy at [facility] follow written instructions given to you at your visit today.  Please use the moviprep coupon to get a free kit at the pharmacy. If you use inhalers (even only as needed), please bring them with you on the day of your procedure. Your physician has requested that you go to www.startemmi.com and enter the access code given to you at your visit today. This web site gives a general overview about your procedure. However, you should still follow specific instructions given to you by our office regarding your preparation for the procedure.  We will obtain your CT report for Dr. Leone Payor to review.   I appreciate the opportunity to care for you.

## 2012-08-21 ENCOUNTER — Ambulatory Visit (INDEPENDENT_AMBULATORY_CARE_PROVIDER_SITE_OTHER): Payer: Medicare Other | Admitting: *Deleted

## 2012-08-21 ENCOUNTER — Encounter: Payer: Self-pay | Admitting: Internal Medicine

## 2012-08-21 DIAGNOSIS — I4891 Unspecified atrial fibrillation: Secondary | ICD-10-CM

## 2012-08-21 DIAGNOSIS — I2589 Other forms of chronic ischemic heart disease: Secondary | ICD-10-CM

## 2012-08-21 DIAGNOSIS — I5023 Acute on chronic systolic (congestive) heart failure: Secondary | ICD-10-CM

## 2012-08-21 LAB — ICD DEVICE OBSERVATION
AL AMPLITUDE: 1.2 mv
ATRIAL PACING ICD: 1 pct
DEV-0020ICD: NEGATIVE
DEVICE MODEL ICD: 10428
LV LEAD THRESHOLD: 2.6 V
TZON-0003SLOWVT: 461.5 ms
TZST-0001FASTVT: 2
TZST-0001FASTVT: 3
TZST-0003FASTVT: 41 J

## 2012-08-21 NOTE — Progress Notes (Signed)
ICD check in office. 

## 2012-08-29 ENCOUNTER — Encounter (HOSPITAL_COMMUNITY): Payer: Self-pay | Admitting: Internal Medicine

## 2012-08-29 ENCOUNTER — Ambulatory Visit (HOSPITAL_COMMUNITY)
Admission: RE | Admit: 2012-08-29 | Discharge: 2012-08-29 | Disposition: A | Discharge status: Home / Self Care | Payer: Medicare Other | Source: Ambulatory Visit | Attending: Internal Medicine | Admitting: Internal Medicine

## 2012-08-29 ENCOUNTER — Encounter (HOSPITAL_COMMUNITY)
Admission: RE | Disposition: A | Discharge status: Home / Self Care | Payer: Self-pay | Source: Ambulatory Visit | Attending: Internal Medicine

## 2012-08-29 DIAGNOSIS — Z888 Allergy status to other drugs, medicaments and biological substances status: Secondary | ICD-10-CM | POA: Insufficient documentation

## 2012-08-29 DIAGNOSIS — I509 Heart failure, unspecified: Secondary | ICD-10-CM | POA: Insufficient documentation

## 2012-08-29 DIAGNOSIS — K5900 Constipation, unspecified: Secondary | ICD-10-CM | POA: Insufficient documentation

## 2012-08-29 DIAGNOSIS — E119 Type 2 diabetes mellitus without complications: Secondary | ICD-10-CM | POA: Insufficient documentation

## 2012-08-29 DIAGNOSIS — D126 Benign neoplasm of colon, unspecified: Secondary | ICD-10-CM | POA: Insufficient documentation

## 2012-08-29 DIAGNOSIS — Z79899 Other long term (current) drug therapy: Secondary | ICD-10-CM | POA: Insufficient documentation

## 2012-08-29 DIAGNOSIS — Z860100 Personal history of colon polyps, unspecified: Secondary | ICD-10-CM | POA: Diagnosis present

## 2012-08-29 DIAGNOSIS — I2589 Other forms of chronic ischemic heart disease: Secondary | ICD-10-CM | POA: Insufficient documentation

## 2012-08-29 DIAGNOSIS — K644 Residual hemorrhoidal skin tags: Secondary | ICD-10-CM

## 2012-08-29 DIAGNOSIS — Z885 Allergy status to narcotic agent status: Secondary | ICD-10-CM | POA: Insufficient documentation

## 2012-08-29 DIAGNOSIS — I4891 Unspecified atrial fibrillation: Secondary | ICD-10-CM | POA: Insufficient documentation

## 2012-08-29 DIAGNOSIS — R1033 Periumbilical pain: Secondary | ICD-10-CM | POA: Insufficient documentation

## 2012-08-29 DIAGNOSIS — E782 Mixed hyperlipidemia: Secondary | ICD-10-CM | POA: Insufficient documentation

## 2012-08-29 DIAGNOSIS — I252 Old myocardial infarction: Secondary | ICD-10-CM | POA: Insufficient documentation

## 2012-08-29 DIAGNOSIS — I5022 Chronic systolic (congestive) heart failure: Secondary | ICD-10-CM | POA: Insufficient documentation

## 2012-08-29 DIAGNOSIS — Z7982 Long term (current) use of aspirin: Secondary | ICD-10-CM | POA: Insufficient documentation

## 2012-08-29 DIAGNOSIS — J449 Chronic obstructive pulmonary disease, unspecified: Secondary | ICD-10-CM | POA: Insufficient documentation

## 2012-08-29 DIAGNOSIS — Z951 Presence of aortocoronary bypass graft: Secondary | ICD-10-CM | POA: Insufficient documentation

## 2012-08-29 DIAGNOSIS — N289 Disorder of kidney and ureter, unspecified: Secondary | ICD-10-CM | POA: Insufficient documentation

## 2012-08-29 DIAGNOSIS — J4489 Other specified chronic obstructive pulmonary disease: Secondary | ICD-10-CM | POA: Insufficient documentation

## 2012-08-29 DIAGNOSIS — M129 Arthropathy, unspecified: Secondary | ICD-10-CM | POA: Insufficient documentation

## 2012-08-29 DIAGNOSIS — K648 Other hemorrhoids: Secondary | ICD-10-CM | POA: Diagnosis present

## 2012-08-29 DIAGNOSIS — I10 Essential (primary) hypertension: Secondary | ICD-10-CM | POA: Insufficient documentation

## 2012-08-29 DIAGNOSIS — Z95810 Presence of automatic (implantable) cardiac defibrillator: Secondary | ICD-10-CM | POA: Insufficient documentation

## 2012-08-29 DIAGNOSIS — R198 Other specified symptoms and signs involving the digestive system and abdomen: Secondary | ICD-10-CM

## 2012-08-29 DIAGNOSIS — R194 Change in bowel habit: Secondary | ICD-10-CM

## 2012-08-29 DIAGNOSIS — Z8701 Personal history of pneumonia (recurrent): Secondary | ICD-10-CM | POA: Insufficient documentation

## 2012-08-29 DIAGNOSIS — I2510 Atherosclerotic heart disease of native coronary artery without angina pectoris: Secondary | ICD-10-CM | POA: Insufficient documentation

## 2012-08-29 DIAGNOSIS — Z87891 Personal history of nicotine dependence: Secondary | ICD-10-CM | POA: Insufficient documentation

## 2012-08-29 HISTORY — PX: COLONOSCOPY: SHX5424

## 2012-08-29 LAB — GLUCOSE, CAPILLARY: Glucose-Capillary: 119 mg/dL — ABNORMAL HIGH (ref 70–99)

## 2012-08-29 SURGERY — COLONOSCOPY
Anesthesia: Moderate Sedation

## 2012-08-29 MED ORDER — FENTANYL CITRATE 0.05 MG/ML IJ SOLN
INTRAMUSCULAR | Status: DC | PRN
  Administered 2012-08-29 (×4): 25 ug via INTRAVENOUS

## 2012-08-29 MED ORDER — FENTANYL CITRATE 0.05 MG/ML IJ SOLN
INTRAMUSCULAR | Status: AC
  Filled 2012-08-29: qty 4

## 2012-08-29 MED ORDER — MIDAZOLAM HCL 10 MG/2ML IJ SOLN
INTRAMUSCULAR | Status: AC
  Filled 2012-08-29: qty 4

## 2012-08-29 MED ORDER — POLYETHYLENE GLYCOL 3350 17 GM/SCOOP PO POWD: 17.0000 g | Freq: Two times a day (BID) | ORAL | Status: DC

## 2012-08-29 MED ORDER — MIDAZOLAM HCL 5 MG/5ML IJ SOLN
INTRAMUSCULAR | Status: DC | PRN
  Administered 2012-08-29 (×4): 2 mg via INTRAVENOUS

## 2012-08-29 MED ORDER — SODIUM CHLORIDE 0.9 % IV SOLN
INTRAVENOUS | Status: DC
  Administered 2012-08-29: 500 mL via INTRAVENOUS

## 2012-08-29 NOTE — Op Note (Signed)
[name] [address] [place], [address]   COLONOSCOPY PROCEDURE REPORT  [name], Corey [name]  MR#: [id number] BIRTHDATE: 01/10/1936 , 76  yrs. old GENDER: Male ENDOSCOPIST: Iva Boop, MD, [facility] PROCEDURE DATE:  08/29/2012 PROCEDURE:   Colonoscopy with snare polypectomy ASA CLASS:   Class IV - AICD and EF < 35% INDICATIONS:Change in bowel habits. MEDICATIONS: Fentanyl 100 mcg IV and Versed 8 mg IV  DESCRIPTION OF PROCEDURE:   After Corey risks benefits and alternatives of Corey procedure were thoroughly explained, informed consent was obtained.  A digital rectal exam revealed no rectal mass.   Corey     endoscope was introduced through Corey anus and advanced to Corey terminal ileum which was intubated for a short distance. No adverse events experienced.   Corey quality of Corey prep was good, using MoviPrep  Corey instrument was then slowly withdrawn as Corey colon was fully examined.      COLON FINDINGS: A semi-pedunculated polyp measuring 8 mm in size was found in Corey distal sigmoid colon.  A polypectomy was performed with a cold snare.  Corey resection was complete and Corey polyp tissue was completely retrieved.   Corey colon mucosa was otherwise normal. A right colon retroflexion was performed.   Corey mucosa appeared normal in Corey terminal ileum.  Retroflexed views revealed internal/external hemorrhoids. Corey time to cecum=5 minutes 0 seconds.  Withdrawal time=11 minutes 0 seconds.  Corey scope was withdrawn and Corey procedure completed. COMPLICATIONS: There were no complications.  ENDOSCOPIC IMPRESSION: 1.   Semi-pedunculated polyp measuring 8 mm in size was found in Corey distal sigmoid colon; polypectomy was performed with a cold snare 2.   Corey colon mucosa was otherwise normal - good prep 3.   Normal mucosa in Corey terminal ileum  RECOMMENDATIONS: Add MiraLax bid Get blood sugars controlled Follow-up me as needed - I think medical co-morbidities are  causing constipation (CHF, DM, less activity) I will send letter re: polyp pathology - will not need routine repeat colonoscopy   eSigned:  Iva Boop, MD, [facility] 08/29/2012 1:03 PM   cc: Corey [name], Julien Nordmann, MD  and Dale Coleridge, MD

## 2012-08-29 NOTE — H&P (View-Only) (Signed)
Subjective:    Patient ID: Corey [name], male    DOB: 31-Dec-1935, 77 y.o.   MRN: [id number]  HPI The patient is an elderly man here with approximately a years worth of worsening constipation but over the last few months it's really gotten worse. He is having periumbilical pain that radiates down, borborygmi, what he describes as rolling found her in his belly. He has to take laxatives to defecate. He tried MiraLax using a bottle or more over several weeks without good results, he thinks he felt worse. He has had some chronic constipation but he said last year when he had a heart attack, subsequent to that it's been much worse and significantly worse in the last couple of months. He says he doesn't leave the house much of the time because of the abdominal pain and cramping he has. He has not noticed any bleeding. He has difficulty defecating takes 2 Senokot at night and then in the morning drinks milk of Magnesia and then she will take to suppositories. Sometimes that doesn't work and he has to repeat that and then we'll move his bowels the next day. He gets some relief with defecation. He had a CT scan in [place] which he was told was okay except for constipation and subsequent to the visit we did receive the results which showed moderate amount of stool in the colon, no other acute abnormalities and nothing to explain his symptoms. His other symptoms include nausea and some belching. He has never had a colonoscopy.  Allergies  Allergen Reactions  . Coumadin (Warfarin Sodium) Hives    Pt states he broke out all over his body even the bottom of his feet  . Lorazepam Other (See Comments)    "became unresponsive"  . Codeine Other (See Comments)    "made skin crawl"   Outpatient Prescriptions Prior to Visit  Medication Sig Dispense Refill  . acetaminophen (TYLENOL) 500 MG tablet Take 500 mg by mouth every 4 (four) hours as needed. For pain      . aspirin 81 MG tablet Take 81 mg by mouth daily.        . benzonatate (TESSALON) 200 MG capsule Take 1 capsule (200 mg total) by mouth 2 (two) times daily as needed for cough.  30 capsule  1  . digoxin (LANOXIN) 0.125 MG tablet Take 1 tablet (0.125 mg total) by mouth daily.  90 tablet  3  . furosemide (LASIX) 40 MG tablet Take 20 mg by mouth 2 (two) times daily.      Marland Kitchen glimepiride (AMARYL) 4 MG tablet 2 tablets q day  60 tablet  1  . metFORMIN (GLUCOPHAGE) 500 MG tablet Take 1 tablet (500 mg total) by mouth 2 (two) times daily with a meal.  60 tablet  3  . metolazone (ZAROXOLYN) 5 MG tablet Take 1 tablet (5 mg total) by mouth daily as needed.  30 tablet  6  . metoprolol (LOPRESSOR) 50 MG tablet Take 50 mg by mouth daily.       . nitroGLYCERIN (NITROSTAT) 0.4 MG SL tablet Place 0.4 mg under the tongue every 5 (five) minutes x 3 doses as needed. For chest pain      . potassium chloride SA (K-DUR,KLOR-CON) 20 MEQ tablet Take 20 mEq by mouth daily.      . triazolam (HALCION) 0.25 MG tablet Take 0.25 mg by mouth at bedtime as needed. For sleep       No facility-administered medications prior to visit.  Past Medical History  Diagnosis Date  . Atrial fibrillation     s/p ablation, on tikosyn  . CAD, ARTERY BYPASS GRAFT     4v CABG w/ redo '06; cath 08/14/11 high grade lesion in OM graft, total occlusion of SVG to RCA with collaterals,  patent  LIMA to LAD,occlusion of SVG to Diagonal, and severe segmental LV systolic dysfunction, EF 20%   . CARDIOMYOPATHY, ISCHEMIC   . Chronic systolic heart failure   . Dysuria   . HYPERLIPIDEMIA-MIXED   . RENAL INSUFFICIENCY   . Mitral regurgitation      s/p MV repair '06  . HTN (hypertension)   . COPD (chronic obstructive pulmonary disease)   . Complication of anesthesia     "liked to went crazy w/the mess they gave me; started w/an  A"  . CHF (congestive heart failure)   . Myocardial infarction 1987; ~ 08/17/11  . Anginal pain   . Pacemaker 08/24/11  . ICD (implantable cardiac defibrillator) in place  08/24/11  . Pneumonia     h/o "walking pneumonia; long time ago"  . Shortness of breath 08/24/11    "all the time"  . Type II diabetes mellitus   . Arthritis     "in the neck after neck OR"   Past Surgical History  Procedure Laterality Date  . Mitral valve repair      with an angioplasty ring  . Cervical discectomy      C3-C4  . Cardiac defibrillator placement  ~ 2007    Guidant  . Coronary artery bypass graft  1995    CABG X 2  . Coronary artery bypass graft  2002    CABG "& fixed hole in back of the heart"  . Coronary angioplasty with stent placement      "2 I think"  . Inguinal hernia repair Bilateral   . Excisional hemorrhoidectomy    . Anterior fusion cervical spine      "went down in my throat to do it"  . Tee without cardioversion  08/30/2011    Procedure: TRANSESOPHAGEAL ECHOCARDIOGRAM (TEE);  Surgeon: Dolores Patty, MD;  Location: [facility] ENDOSCOPY;  Service: Cardiovascular;  Laterality: N/A;  . Cardioversion  08/30/2011    Procedure: CARDIOVERSION;  Surgeon: Dolores Patty, MD;  Location[facility] ENDOSCOPY;  Service: Cardiovascular;  Laterality: N/A;  . Mass biopsy  2013    nasal eposcopic   History   Social History  . Marital Status: Widowed    Spouse Name: N/A    Number of Children: 2    Social History Main Topics  . Smoking status: Former Smoker -- 1.50 packs/day for 40 years    Types: Cigarettes, Pipe    Quit date: 03/06/1985  . Smokeless tobacco: Former Neurosurgeon    Types: Snuff, Dorna Bloom    Quit date: 03/06/1958     Comment: "quit chewing and dipping in my early 20's"  . Alcohol Use: No     Comment: 08/24/11 "last alcohol 15 years ago or better"  . Drug Use: No  . Sexually Active: No   Other Topics Concern  . None   Social History Narrative   The patient is single, says he has 2 sons.   He is retired.   Family History  Problem Relation Age of Onset  . Hypertension Mother   . Skin cancer Mother   . Heart disease Brother     x 3  . Leukemia Sister      Review of Systems  As per history of present illness, his blood sugars have been markedly increased some lately as well, he said dyspnea joint pains insomnia pedal edema and muscle pains. All other review of systems negative.    Objective:   Physical Exam General:  Chronically ill and in no acute distress Eyes:  anicteric. ENT:   Mouth and pharynx clear Neck:   supple w/o thyromegaly or mass.  Lungs: Clear to auscultation bilaterally. Heart:  S1S2, no rubs, murmurs, gallops. Abdomen:  soft, non-tender, no hepatosplenomegaly, hernia, or mass and BS+.  Rectal: Soft brown heme negative stool, no mass, anoderm ok Lymph:  no cervical or supraclavicular adenopathy. Extremities:   no edema Skin   no rash. But scarring in LE's, some fresh traumatic shin ulcers, senile purpura Neuro:  A&O x 3.  Psych:  appropriate mood and  Affect.   Data Reviewed: Lab Results  Component Value Date   WBC 7.2 07/02/2012   HGB 12.4* 07/02/2012   HCT 35.3* 07/02/2012   MCV 93.9 07/02/2012   PLT 190.0 07/02/2012     Chemistry      Component Value Date/Time   NA 134* 07/02/2012 1019   NA 139 02/16/2012 0941   K 4.0 07/02/2012 1019   CL 96 07/02/2012 1019   CO2 29 07/02/2012 1019   BUN 21 07/02/2012 1019   BUN 28* 02/16/2012 0941   CREATININE 1.3 07/02/2012 1019      Component Value Date/Time   CALCIUM 8.6 07/02/2012 1019   ALKPHOS 52 06/07/2012 0852   AST 24 06/07/2012 0852   ALT 15 06/07/2012 0852   BILITOT 1.2 06/07/2012 0852     Lab Results  Component Value Date   TSH 4.72 06/07/2012      Assessment & Plan:  Change in bowel habits -   Unspecified constipation  Periumbilical abdominal pain  Cause of his problems are not entirely clear. However he's had worsening constipation problems and is never had a colonoscopy so that's a reasonable thing to do. It could be that is poorly controlled diabetes is the source of most or all of this. But as discussed with the patient we should rule out colorectal  neoplasia or other problems. Symptomatic diverticulosis is a possibility as well.  He will continue with his current regimen, he has failed MiraLax.  The risks and benefits as well as alternatives of endoscopic procedure(s) have been discussed and reviewed. All questions answered. The patient agrees to proceed.  Given his cardiac comorbidities including a defibrillator with an ejection fraction less than 35% it would be safest to have him perform his procedure at the hospital and we will do this on June 26.  ZO:[id number] S, MD Dossie Arbour, MD

## 2012-08-29 NOTE — Interval H&P Note (Signed)
History and Physical Interval Note:  08/29/2012 12:22 PM  Corey [name]  has presented today for surgery, with the diagnosis of Bowel habit changes [787.99]  The various methods of treatment have been discussed with the patient and family. After consideration of risks, benefits and other options for treatment, the patient has consented to  Procedure(s): COLONOSCOPY (N/A) as a surgical intervention .  The patient's history has been reviewed, patient examined, no change in status, stable for surgery.  I have reviewed the patient's chart and labs.  Questions were answered to the patient's satisfaction.     Stan Head

## 2012-08-30 ENCOUNTER — Encounter: Payer: Medicare Other | Admitting: Internal Medicine

## 2012-09-02 ENCOUNTER — Encounter (HOSPITAL_COMMUNITY): Payer: Self-pay | Admitting: Internal Medicine

## 2012-09-03 ENCOUNTER — Encounter: Payer: Self-pay | Admitting: Internal Medicine

## 2012-09-03 NOTE — Progress Notes (Signed)
Quick Note:  Adenoma No routine repeat colon needed ______

## 2012-09-13 ENCOUNTER — Other Ambulatory Visit: Payer: Self-pay | Admitting: *Deleted

## 2012-09-13 MED ORDER — TRIAZOLAM 0.25 MG PO TABS: 0.2500 mg | ORAL_TABLET | Freq: Every evening | ORAL | Status: DC | PRN

## 2012-09-13 NOTE — Telephone Encounter (Signed)
He is new to me and I have not filled this (to my knowledge).  Make sure he takes this and has no problems.  Also, please confirm if he takes it regularly.  Thanks.  If he has been taking regularly and no problems, ok to refill x 1.  He needs a f/u appt with me ( ) - sometime within the next month.

## 2012-09-13 NOTE — Telephone Encounter (Signed)
Pt needs to reschedule a f/u appt with Dr. Lorin Picket in the next month (30 min slot). Pt is actually due for a physical. Pt infomed that someone will call him on Monday to schedule his appt.

## 2012-09-18 ENCOUNTER — Telehealth: Payer: Self-pay | Admitting: Internal Medicine

## 2012-09-18 NOTE — Telephone Encounter (Signed)
Pt stated that pharmacy does not have any Rx ready as of this morning. I faxed it to [facility] on 09/13/12. I called this morning & verified that it was not received. I re-faxed again this morning. Pt & pharmacy aware.

## 2012-09-18 NOTE — Telephone Encounter (Signed)
Pt is needing refill on Triazolam .25 mg and [facility] uses [facility] in [place]

## 2012-10-16 ENCOUNTER — Encounter: Payer: Self-pay | Admitting: Internal Medicine

## 2012-10-16 ENCOUNTER — Ambulatory Visit (INDEPENDENT_AMBULATORY_CARE_PROVIDER_SITE_OTHER): Payer: Medicare Other | Admitting: Internal Medicine

## 2012-10-16 VITALS — BP 122/80 | HR 70 | Temp 97.8°F | Ht 68.0 in | Wt 173.8 lb

## 2012-10-16 DIAGNOSIS — E785 Hyperlipidemia, unspecified: Secondary | ICD-10-CM

## 2012-10-16 DIAGNOSIS — R0602 Shortness of breath: Secondary | ICD-10-CM

## 2012-10-16 DIAGNOSIS — R109 Unspecified abdominal pain: Secondary | ICD-10-CM

## 2012-10-16 DIAGNOSIS — Z86010 Personal history of colonic polyps: Secondary | ICD-10-CM

## 2012-10-16 DIAGNOSIS — I10 Essential (primary) hypertension: Secondary | ICD-10-CM

## 2012-10-16 DIAGNOSIS — K219 Gastro-esophageal reflux disease without esophagitis: Secondary | ICD-10-CM

## 2012-10-16 DIAGNOSIS — I4891 Unspecified atrial fibrillation: Secondary | ICD-10-CM

## 2012-10-16 DIAGNOSIS — D649 Anemia, unspecified: Secondary | ICD-10-CM

## 2012-10-16 DIAGNOSIS — N259 Disorder resulting from impaired renal tubular function, unspecified: Secondary | ICD-10-CM

## 2012-10-16 DIAGNOSIS — E119 Type 2 diabetes mellitus without complications: Secondary | ICD-10-CM

## 2012-10-18 ENCOUNTER — Encounter: Payer: Self-pay | Admitting: Internal Medicine

## 2012-10-18 NOTE — Assessment & Plan Note (Signed)
Off simvastatin.  Leg pain improved.  Follow cholesterol.

## 2012-10-18 NOTE — Assessment & Plan Note (Signed)
Blood pressure under good control.  Follow.   

## 2012-10-18 NOTE — Assessment & Plan Note (Signed)
Had colonoscopy.  See report.  Persistent abdominal pain.  Refer back to GI for further treatment and evaluation.  CT - no acute abnormality.

## 2012-10-18 NOTE — Assessment & Plan Note (Signed)
Discussed the importance of eating regular meals and appropriate snacks - including a bedtime snack.  Continue to follow.   Discussed the importance of checking sugars regularly and recording.  Gave him a new monitor.  Will check metabolic panel and a1c.  Keep up to date with eye exams.

## 2012-10-18 NOTE — Assessment & Plan Note (Signed)
Breathing overall stable.  ?

## 2012-10-18 NOTE — Assessment & Plan Note (Signed)
Last Cr 1.3.  Stable.   Follow renal function.

## 2012-10-18 NOTE — Progress Notes (Signed)
Subjective:    Patient ID: Corey [name], male    DOB: 1935/08/01, 77 y.o.   MRN: [id number]  HPI 77 year old male with past history of CAD s/p CABG x 2, chronic systolic heart failure, renal insufficiency, hypercholesterolemia, hypertension and diabetes.  He comes in today for a scheduled follow up.  He reports his sugars are doing better.  No recorded sugar readings.   Have discussed eating regular meals and a snack before bed.  He is still having problems with increased abdominal discomfort.   Involves mostly his lower stomach.  Urinating ok.  Persistent problems with constipation. No vomiting.  No blood.  Had CT abdomen/pelvis - no acute finding.  He saw GI.  Had colonoscopy.  No acute abnormality.  Reports persistent symptoms and request referral back to GI to discuss.  Breathing appears to be stable.  Continues to follow up with Dr Mariah Milling.    Past Medical History  Diagnosis Date  . Atrial fibrillation     s/p ablation, on tikosyn  . CAD, ARTERY BYPASS GRAFT     4v CABG w/ redo '06; cath 08/14/11 high grade lesion in OM graft, total occlusion of SVG to RCA with collaterals,  patent  LIMA to LAD,occlusion of SVG to Diagonal, and severe segmental LV systolic dysfunction, EF 20%   . CARDIOMYOPATHY, ISCHEMIC   . Chronic systolic heart failure   . Dysuria   . HYPERLIPIDEMIA-MIXED   . RENAL INSUFFICIENCY   . Mitral regurgitation      s/p MV repair '06  . HTN (hypertension)   . COPD (chronic obstructive pulmonary disease)   . Complication of anesthesia     "liked to went crazy w/the mess they gave me; started w/an  A"  . CHF (congestive heart failure)   . Myocardial infarction 1987; ~ 08/17/11  . Anginal pain   . Pacemaker 08/24/11  . ICD (implantable cardiac defibrillator) in place 08/24/11  . Pneumonia     h/o "walking pneumonia; long time ago"  . Shortness of breath 08/24/11    "all the time"  . Type II diabetes mellitus   . Arthritis     "in the neck after neck OR"  . Benign  neoplasm of colon 08/29/2012  . Personal history of colonic adenoma 08/29/2012     Outpatient Encounter Prescriptions as of 10/16/2012  Medication Sig Dispense Refill  . acetaminophen (TYLENOL) 500 MG tablet Take 500 mg by mouth every 4 (four) hours as needed. For pain      . aspirin 81 MG tablet Take 81 mg by mouth daily.       . digoxin (LANOXIN) 0.125 MG tablet Take 1 tablet (0.125 mg total) by mouth daily.  90 tablet  3  . furosemide (LASIX) 40 MG tablet Take 40 mg by mouth 2 (two) times daily.       Marland Kitchen glimepiride (AMARYL) 4 MG tablet 2 tablets q day  60 tablet  1  . isosorbide mononitrate (IMDUR) 60 MG 24 hr tablet TAKE 1 TABLET (60 MG TOTAL) BY MOUTH DAILY.  30 tablet  3  . metFORMIN (GLUCOPHAGE) 500 MG tablet Take 1 tablet (500 mg total) by mouth 2 (two) times daily with a meal.  60 tablet  3  . metolazone (ZAROXOLYN) 5 MG tablet Take 1 tablet (5 mg total) by mouth daily as needed.  30 tablet  6  . metoprolol (LOPRESSOR) 50 MG tablet Take 50 mg by mouth daily.       Marland Kitchen  nitroGLYCERIN (NITROSTAT) 0.4 MG SL tablet Place 0.4 mg under the tongue every 5 (five) minutes x 3 doses as needed. For chest pain      . polyethylene glycol powder (MIRALAX) powder Take 17 g by mouth 2 (two) times daily.  255 g  0  . potassium chloride SA (K-DUR,KLOR-CON) 20 MEQ tablet Take 20 mEq by mouth daily.      . triazolam (HALCION) 0.25 MG tablet Take 1 tablet (0.25 mg total) by mouth at bedtime as needed. For sleep  30 tablet  1  . [DISCONTINUED] benzonatate (TESSALON) 200 MG capsule Take 1 capsule (200 mg total) by mouth 2 (two) times daily as needed for cough.  30 capsule  1   No facility-administered encounter medications on file as of 10/16/2012.    Review of Systems Patient denies any headache, lightheadedness or dizziness. No sinus or allergy symptoms.   No chest pain, tightness or palpitations currently.  Gets sob with exertion.  Has to sit and rest.  Breathing appears to be stable.  No BRBPR or melana.   Does have issues with constipation.  See above.  No urine change.  Lower abdominal pain as outlined.  Sugars per his report are better.  Brought in no recorded sugar readings.         Objective:   Physical Exam  Filed Vitals:   10/16/12 1037  BP: 122/80  Pulse: 70  Temp: 97.8 F (61.61 C)   77 year old male in no acute distress.  HEENT:  Nares - clear.  Oropharynx - without lesions. NECK:  Supple.  Nontender.  No audible carotid bruit.  HEART:  Appears to be regular.   LUNGS:  No crackles or wheezing audible.  Respirations even and unlabored.   RADIAL PULSE:  Equal bilaterally.  ABDOMEN:  Soft.  No significant tenderness noted today.  Bowel sounds present and normal.  No audible abdominal bruit.  No rebound or guarding.  EXTREMITIES:  No increased edema present.  Stable.         Assessment & Plan:  COUGH.  Resolved.      HEALTH MAINTENANCE.  Schedule a physical for next visit.  Colonoscopy per GI as outlined.  PSA 06/07/12 - .20.

## 2012-10-18 NOTE — Assessment & Plan Note (Signed)
Stable.  Continues follow up with Dr Gollan.   

## 2012-10-18 NOTE — Assessment & Plan Note (Signed)
Symptoms controlled

## 2012-10-23 ENCOUNTER — Encounter: Payer: Self-pay | Admitting: *Deleted

## 2012-10-23 ENCOUNTER — Other Ambulatory Visit (INDEPENDENT_AMBULATORY_CARE_PROVIDER_SITE_OTHER): Payer: Medicare Other

## 2012-10-23 DIAGNOSIS — D649 Anemia, unspecified: Secondary | ICD-10-CM

## 2012-10-23 DIAGNOSIS — E119 Type 2 diabetes mellitus without complications: Secondary | ICD-10-CM

## 2012-10-23 DIAGNOSIS — N259 Disorder resulting from impaired renal tubular function, unspecified: Secondary | ICD-10-CM

## 2012-10-23 DIAGNOSIS — E785 Hyperlipidemia, unspecified: Secondary | ICD-10-CM

## 2012-10-23 DIAGNOSIS — R109 Unspecified abdominal pain: Secondary | ICD-10-CM

## 2012-10-23 LAB — CBC WITH DIFFERENTIAL/PLATELET
Basophils Absolute: 0 10*3/uL (ref 0.0–0.1)
Basophils Relative: 0.4 % (ref 0.0–3.0)
Eosinophils Absolute: 0.1 10*3/uL (ref 0.0–0.7)
Lymphocytes Relative: 14.3 % (ref 12.0–46.0)
MCHC: 33.8 g/dL (ref 30.0–36.0)
MCV: 96.9 fl (ref 78.0–100.0)
Monocytes Absolute: 0.5 10*3/uL (ref 0.1–1.0)
Neutrophils Relative %: 77.6 % — ABNORMAL HIGH (ref 43.0–77.0)
Platelets: 154 10*3/uL (ref 150.0–400.0)
RBC: 4.01 Mil/uL — ABNORMAL LOW (ref 4.22–5.81)

## 2012-10-23 LAB — BASIC METABOLIC PANEL
BUN: 26 mg/dL — ABNORMAL HIGH (ref 6–23)
CO2: 28 mEq/L (ref 19–32)
Calcium: 9.3 mg/dL (ref 8.4–10.5)
Chloride: 98 mEq/L (ref 96–112)
Creatinine, Ser: 1.4 mg/dL (ref 0.4–1.5)

## 2012-10-23 LAB — HEPATIC FUNCTION PANEL
ALT: 16 U/L (ref 0–53)
AST: 24 U/L (ref 0–37)
Albumin: 4.2 g/dL (ref 3.5–5.2)
Alkaline Phosphatase: 43 U/L (ref 39–117)
Bilirubin, Direct: 0.3 mg/dL (ref 0.0–0.3)
Total Protein: 7.3 g/dL (ref 6.0–8.3)

## 2012-10-23 LAB — LIPID PANEL
Cholesterol: 174 mg/dL (ref 0–200)
HDL: 28.3 mg/dL — ABNORMAL LOW (ref >39.00)
Triglycerides: 123 mg/dL (ref 0.0–149.0)

## 2012-10-23 LAB — IBC PANEL: Iron: 72 ug/dL (ref 42–165)

## 2012-10-24 ENCOUNTER — Other Ambulatory Visit: Payer: Self-pay | Admitting: Internal Medicine

## 2012-10-24 NOTE — Progress Notes (Signed)
Order placed for f/u liver panel.  

## 2012-10-30 ENCOUNTER — Ambulatory Visit (INDEPENDENT_AMBULATORY_CARE_PROVIDER_SITE_OTHER): Payer: Medicare Other | Admitting: *Deleted

## 2012-10-30 DIAGNOSIS — E538 Deficiency of other specified B group vitamins: Secondary | ICD-10-CM

## 2012-10-30 MED ORDER — CYANOCOBALAMIN 1000 MCG/ML IJ SOLN
1000.0000 ug | Freq: Once | INTRAMUSCULAR | Status: AC
  Administered 2012-10-30: 1000 ug via INTRAMUSCULAR

## 2012-11-08 ENCOUNTER — Other Ambulatory Visit (INDEPENDENT_AMBULATORY_CARE_PROVIDER_SITE_OTHER): Payer: Medicare Other

## 2012-11-08 ENCOUNTER — Encounter: Payer: Self-pay | Admitting: *Deleted

## 2012-11-08 DIAGNOSIS — R17 Unspecified jaundice: Secondary | ICD-10-CM

## 2012-11-08 DIAGNOSIS — E538 Deficiency of other specified B group vitamins: Secondary | ICD-10-CM

## 2012-11-08 LAB — HEPATIC FUNCTION PANEL
ALT: 16 U/L (ref 0–53)
AST: 23 U/L (ref 0–37)
Alkaline Phosphatase: 43 U/L (ref 39–117)
Bilirubin, Direct: 0.2 mg/dL (ref 0.0–0.3)
Total Bilirubin: 1.1 mg/dL (ref 0.3–1.2)
Total Protein: 7.9 g/dL (ref 6.0–8.3)

## 2012-11-08 MED ORDER — CYANOCOBALAMIN 1000 MCG/ML IJ SOLN
1000.0000 ug | Freq: Once | INTRAMUSCULAR | Status: AC
  Administered 2012-11-08: 1000 ug via INTRAMUSCULAR

## 2012-11-08 NOTE — Addendum Note (Signed)
Addended by: Theola Sequin on: 11/08/2012 09:34 AM   Modules accepted: Orders

## 2012-11-13 ENCOUNTER — Other Ambulatory Visit: Payer: Self-pay | Admitting: *Deleted

## 2012-11-13 MED ORDER — TRIAZOLAM 0.25 MG PO TABS: 0.2500 mg | ORAL_TABLET | Freq: Every evening | ORAL | Status: DC | PRN

## 2012-11-13 NOTE — Telephone Encounter (Signed)
Refilled halcion #30 with one refill.

## 2012-11-13 NOTE — Telephone Encounter (Signed)
Okay to refill? 

## 2012-11-13 NOTE — Telephone Encounter (Signed)
Reprinted (wrong physician signed first Rx)

## 2012-11-14 ENCOUNTER — Ambulatory Visit (INDEPENDENT_AMBULATORY_CARE_PROVIDER_SITE_OTHER): Payer: Medicare Other | Admitting: *Deleted

## 2012-11-14 ENCOUNTER — Encounter: Payer: Self-pay | Admitting: Internal Medicine

## 2012-11-14 ENCOUNTER — Ambulatory Visit (INDEPENDENT_AMBULATORY_CARE_PROVIDER_SITE_OTHER): Payer: Medicare Other | Admitting: Internal Medicine

## 2012-11-14 VITALS — BP 110/62 | HR 76 | Ht 68.0 in | Wt 174.0 lb

## 2012-11-14 DIAGNOSIS — K589 Irritable bowel syndrome without diarrhea: Secondary | ICD-10-CM

## 2012-11-14 DIAGNOSIS — E538 Deficiency of other specified B group vitamins: Secondary | ICD-10-CM

## 2012-11-14 DIAGNOSIS — K5900 Constipation, unspecified: Secondary | ICD-10-CM

## 2012-11-14 DIAGNOSIS — R109 Unspecified abdominal pain: Secondary | ICD-10-CM

## 2012-11-14 MED ORDER — METRONIDAZOLE 250 MG PO TABS: 250.0000 mg | ORAL_TABLET | Freq: Three times a day (TID) | ORAL | Status: DC

## 2012-11-14 MED ORDER — CYANOCOBALAMIN 1000 MCG/ML IJ SOLN
1000.0000 ug | Freq: Once | INTRAMUSCULAR | Status: AC
  Administered 2012-11-14: 1000 ug via INTRAMUSCULAR

## 2012-11-14 NOTE — Progress Notes (Signed)
  Subjective:    Patient ID: Corey [name], male    DOB: April 18, 1935, 77 y.o.   MRN: [id number]  HPI The patient is still struggling with lower abdominal cramps and constipation. Using MiraLax with some benefit - takes qod because says it sometimes increased abdominal pain. He has borborygmi and cramps. Things worse when he bends over and even gets dyspnea with that. Recent colonoscopy did not show a cause. Diabetes better controlled.  He is very frustrated by sxs  Medications, allergies, past medical history, past surgical history, family history and social history are reviewed and updated in the EMR.   Review of Systems Eye pain - was treated with doxycycline po and steroid drops - ? If ocular rosacea ? - says these did not help    Objective:   Physical Exam General:  NAD Eyes:   anicteric Lungs:  clear Heart:  S1S2 no rubs, murmurs or gallops Abdomen:  soft and nontender, BS+ Ext:   no edema  Data Reviewed:   Lab Results  Component Value Date   WBC 7.4 10/23/2012   HGB 13.1 10/23/2012   HCT 38.9* 10/23/2012   MCV 96.9 10/23/2012   PLT 154.0 10/23/2012     Chemistry      Component Value Date/Time   NA 134* 10/23/2012 0957   NA 139 02/16/2012 0941   K 3.9 10/23/2012 0957   CL 98 10/23/2012 0957   CO2 28 10/23/2012 0957   BUN 26* 10/23/2012 0957   BUN 28* 02/16/2012 0941   CREATININE 1.4 10/23/2012 0957      Component Value Date/Time   CALCIUM 9.3 10/23/2012 0957   ALKPHOS 43 11/08/2012 0852   AST 23 11/08/2012 0852   ALT 16 11/08/2012 0852   BILITOT 1.1 11/08/2012 0852     Lab Results  Component Value Date   HGBA1C 7.7* 10/23/2012      Assessment & Plan:  Unspecified constipation  Abdominal  pain, other specified site, lower  IBS (irritable bowel syndrome) ?  1. This may be diabetic neuropathy - all or part but ? Possible small bowel bacterial overgrowth 2. Empiric metronidazole 250 mg tid x 10days 3. Call me back in 3 weeks w/ update 4. contiue MiraLax and prn laxatives  - Linzess and Amitiza not on formularies 5. Next step if Abx do not help is not clear - ? Tx neuropathy  I appreciate the opportunity to care for this patient.  [id number] S, MD

## 2012-11-14 NOTE — Patient Instructions (Addendum)
We have sent the following medications to your pharmacy for you to pick up at your convenience: Metronidazole Please call us at the beginning of October for a condition update. Follow up in 2 months with Dr Leone Payor CC:  Dale Benedict MD

## 2012-11-15 ENCOUNTER — Encounter: Payer: Medicare Other | Admitting: Internal Medicine

## 2012-11-18 ENCOUNTER — Telehealth: Payer: Self-pay | Admitting: Internal Medicine

## 2012-11-18 NOTE — Telephone Encounter (Signed)
Rx was faxed on 9/11. Refaxed this morning at 9:33am

## 2012-11-18 NOTE — Telephone Encounter (Signed)
The pharmacy has not received this patient's triazolam (HALCION) 0.25 MG tablet .

## 2012-12-05 ENCOUNTER — Telehealth: Payer: Self-pay | Admitting: Internal Medicine

## 2012-12-05 NOTE — Telephone Encounter (Signed)
Patient advised of Dr. Gessner's recommendations 

## 2012-12-05 NOTE — Telephone Encounter (Signed)
No improvement in his constipation after taking Flagyl.  Is still using Miralax.  Having a BM about every third day.  Please advise next step

## 2012-12-05 NOTE — Telephone Encounter (Signed)
Take sennokot or dulcolax every other night

## 2012-12-18 ENCOUNTER — Ambulatory Visit (INDEPENDENT_AMBULATORY_CARE_PROVIDER_SITE_OTHER): Payer: Medicare Other

## 2012-12-18 DIAGNOSIS — Z23 Encounter for immunization: Secondary | ICD-10-CM

## 2012-12-28 ENCOUNTER — Other Ambulatory Visit: Payer: Self-pay | Admitting: Cardiovascular Disease

## 2012-12-30 ENCOUNTER — Other Ambulatory Visit: Payer: Self-pay | Admitting: *Deleted

## 2012-12-30 MED ORDER — ISOSORBIDE MONONITRATE ER 60 MG PO TB24: ORAL_TABLET | ORAL | Status: DC

## 2012-12-30 NOTE — Telephone Encounter (Signed)
Requested Prescriptions   Signed Prescriptions Disp Refills  . isosorbide mononitrate (IMDUR) 60 MG 24 hr tablet 30 tablet 3    Sig: TAKE 1 TABLET (60 MG TOTAL) BY MOUTH DAILY.    Authorizing Provider: Antonieta Iba    Ordering User: Kendrick Fries

## 2013-01-21 ENCOUNTER — Other Ambulatory Visit: Payer: Self-pay | Admitting: Internal Medicine

## 2013-01-29 ENCOUNTER — Encounter: Payer: Self-pay | Admitting: Internal Medicine

## 2013-01-29 ENCOUNTER — Encounter (INDEPENDENT_AMBULATORY_CARE_PROVIDER_SITE_OTHER): Payer: Self-pay

## 2013-01-29 ENCOUNTER — Ambulatory Visit (INDEPENDENT_AMBULATORY_CARE_PROVIDER_SITE_OTHER): Payer: Medicare Other | Admitting: Internal Medicine

## 2013-01-29 VITALS — BP 130/80 | HR 70 | Temp 98.1°F | Ht 68.0 in | Wt 172.5 lb

## 2013-01-29 DIAGNOSIS — I10 Essential (primary) hypertension: Secondary | ICD-10-CM

## 2013-01-29 DIAGNOSIS — Z23 Encounter for immunization: Secondary | ICD-10-CM

## 2013-01-29 DIAGNOSIS — Z860100 Personal history of colon polyps, unspecified: Secondary | ICD-10-CM

## 2013-01-29 DIAGNOSIS — E538 Deficiency of other specified B group vitamins: Secondary | ICD-10-CM

## 2013-01-29 DIAGNOSIS — I2589 Other forms of chronic ischemic heart disease: Secondary | ICD-10-CM

## 2013-01-29 DIAGNOSIS — N259 Disorder resulting from impaired renal tubular function, unspecified: Secondary | ICD-10-CM

## 2013-01-29 DIAGNOSIS — K219 Gastro-esophageal reflux disease without esophagitis: Secondary | ICD-10-CM

## 2013-01-29 DIAGNOSIS — I25810 Atherosclerosis of coronary artery bypass graft(s) without angina pectoris: Secondary | ICD-10-CM

## 2013-01-29 DIAGNOSIS — I4891 Unspecified atrial fibrillation: Secondary | ICD-10-CM

## 2013-01-29 DIAGNOSIS — E119 Type 2 diabetes mellitus without complications: Secondary | ICD-10-CM

## 2013-01-29 DIAGNOSIS — R0602 Shortness of breath: Secondary | ICD-10-CM

## 2013-01-29 DIAGNOSIS — R110 Nausea: Secondary | ICD-10-CM

## 2013-01-29 DIAGNOSIS — E785 Hyperlipidemia, unspecified: Secondary | ICD-10-CM

## 2013-01-29 DIAGNOSIS — K5900 Constipation, unspecified: Secondary | ICD-10-CM

## 2013-01-29 LAB — BASIC METABOLIC PANEL
BUN: 31 mg/dL — ABNORMAL HIGH (ref 6–23)
Calcium: 9.5 mg/dL (ref 8.4–10.5)
Creatinine, Ser: 1.3 mg/dL (ref 0.4–1.5)

## 2013-01-29 LAB — LIPID PANEL
Cholesterol: 188 mg/dL (ref 0–200)
Triglycerides: 145 mg/dL (ref 0.0–149.0)

## 2013-01-29 LAB — MICROALBUMIN / CREATININE URINE RATIO: Creatinine,U: 144.9 mg/dL

## 2013-01-29 LAB — HEPATIC FUNCTION PANEL
AST: 23 U/L (ref 0–37)
Albumin: 4 g/dL (ref 3.5–5.2)
Alkaline Phosphatase: 40 U/L (ref 39–117)
Bilirubin, Direct: 0.2 mg/dL (ref 0.0–0.3)
Total Protein: 8.3 g/dL (ref 6.0–8.3)

## 2013-01-29 MED ORDER — AMOXICILLIN-POT CLAVULANATE 500-125 MG PO TABS: 1.0000 | ORAL_TABLET | Freq: Two times a day (BID) | ORAL | Status: DC

## 2013-01-29 MED ORDER — CYANOCOBALAMIN 1000 MCG/ML IJ SOLN
1000.0000 ug | Freq: Once | INTRAMUSCULAR | Status: AC
  Administered 2013-01-29: 1000 ug via INTRAMUSCULAR

## 2013-01-29 NOTE — Progress Notes (Signed)
Pre-visit discussion using our clinic review tool. No additional management support is needed unless otherwise documented below in the visit note.  

## 2013-01-29 NOTE — Progress Notes (Signed)
Subjective:    Patient ID: Corey [name], male    DOB: Apr 22, 1935, 77 y.o.   MRN: [id number]  HPI 77 year old male with past history of CAD s/p CABG x 2, chronic systolic heart failure, renal insufficiency, hypercholesterolemia, hypertension and diabetes.  He comes in today for a scheduled follow up.  He reports his sugars are doing well.  Brought in no recorded sugar readings.  States am sugars are averaging 130s and pm sugars averaging upper 130s.   Have discussed the importance of eating regular meals and a snack before bed.  The abdominal pressure and gas has improved.  Seeing GI.  See Dr Eliezer Mccoy note for details.  Urinating ok.  Persistent problems with constipation.  No vomiting.  No blood.  Had CT abdomen/pelvis - no acute finding.  Had colonoscopy.  No acute abnormality.   Breathing appears to be stable.  Still has good and bad days.  Still get sob with exertion.  Continues to follow up with Dr Mariah Milling.  Has f/u in 12/14 with Dr Mariah Milling.     Past Medical History  Diagnosis Date  . Atrial fibrillation     s/p ablation, on tikosyn  . CAD, ARTERY BYPASS GRAFT     4v CABG w/ redo '06; cath 08/14/11 high grade lesion in OM graft, total occlusion of SVG to RCA with collaterals,  patent  LIMA to LAD,occlusion of SVG to Diagonal, and severe segmental LV systolic dysfunction, EF 20%   . CARDIOMYOPATHY, ISCHEMIC   . Chronic systolic heart failure   . Dysuria   . HYPERLIPIDEMIA-MIXED   . RENAL INSUFFICIENCY   . Mitral regurgitation      s/p MV repair '06  . HTN (hypertension)   . COPD (chronic obstructive pulmonary disease)   . Complication of anesthesia     "liked to went crazy w/the mess they gave me; started w/an  A"  . CHF (congestive heart failure)   . Myocardial infarction 1987; ~ 08/17/11  . Anginal pain   . Pacemaker 08/24/11  . ICD (implantable cardiac defibrillator) in place 08/24/11  . Pneumonia     h/o "walking pneumonia; long time ago"  . Shortness of breath 08/24/11    "all the  time"  . Type II diabetes mellitus   . Arthritis     "in the neck after neck OR"  . Benign neoplasm of colon 08/29/2012  . Personal history of colonic adenoma 08/29/2012  . GERD (gastroesophageal reflux disease)   . Anemia   . History of warfarin toxicity     skin necrosis     Outpatient Encounter Prescriptions as of 01/29/2013  Medication Sig  . acetaminophen (TYLENOL) 500 MG tablet Take 500 mg by mouth every 4 (four) hours as needed. For pain  . aspirin 81 MG tablet Take 81 mg by mouth daily.   . digoxin (LANOXIN) 0.125 MG tablet Take 1 tablet (0.125 mg total) by mouth daily.  . furosemide (LASIX) 40 MG tablet Take 40 mg by mouth 2 (two) times daily.   Marland Kitchen glimepiride (AMARYL) 4 MG tablet 2 tablets q day  . isosorbide mononitrate (IMDUR) 60 MG 24 hr tablet TAKE 1 TABLET (60 MG TOTAL) BY MOUTH DAILY.  . metFORMIN (GLUCOPHAGE) 500 MG tablet TAKE 1 TABLET (500 MG TOTAL) BY MOUTH 2 (TWO) TIMES DAILY WITH A MEAL.  Marland Kitchen metolazone (ZAROXOLYN) 5 MG tablet Take 1 tablet (5 mg total) by mouth daily as needed.  . metoprolol (LOPRESSOR) 50 MG tablet Take  50 mg by mouth daily.   . nitroGLYCERIN (NITROSTAT) 0.4 MG SL tablet Place 0.4 mg under the tongue every 5 (five) minutes x 3 doses as needed. For chest pain  . polyethylene glycol powder (MIRALAX) powder Take 17 g by mouth 2 (two) times daily.  . potassium chloride SA (K-DUR,KLOR-CON) 20 MEQ tablet Take 20 mEq by mouth daily.  . triazolam (HALCION) 0.25 MG tablet Take 1 tablet (0.25 mg total) by mouth at bedtime as needed. For sleep  . [DISCONTINUED] isosorbide mononitrate (IMDUR) 60 MG 24 hr tablet TAKE 1 TABLET (60 MG TOTAL) BY MOUTH DAILY.  . [DISCONTINUED] metroNIDAZOLE (FLAGYL) 250 MG tablet Take 1 tablet (250 mg total) by mouth 3 (three) times daily.    Review of Systems Patient denies any headache, lightheadedness or dizziness.  No sinus or allergy symptoms.   No chest pain, tightness or palpitations currently.  Gets sob with exertion.  Has  to sit and rest.  Breathing appears to be stable.  No BRBPR or melana.  Does have issues with constipation.  See above.  No urine change.  Lower abdominal pain improved. Sugars per his report are better.  See above for sugar averages.  Brought in no recorded sugar readings.         Objective:   Physical Exam  Filed Vitals:   01/29/13 0802  BP: 130/80  Pulse: 70  Temp: 98.1 F (65.77 C)   77 year old male in no acute distress.  HEENT:  Nares - clear.  Oropharynx - without lesions. NECK:  Supple.  Nontender.  No audible carotid bruit.  HEART:  Appears to be regular.   LUNGS:  No crackles or wheezing audible.  Respirations even and unlabored.   RADIAL PULSE:  Equal bilaterally.  ABDOMEN:  Soft.  No significant tenderness noted today.  Bowel sounds present and normal.  No audible abdominal bruit.  No rebound or guarding.  EXTREMITIES:  No increased edema present.  Stable.         Assessment & Plan:  COUGH.  Resolved.      HEALTH MAINTENANCE.  Schedule a physical for next visit.  Colonoscopy per GI as outlined.  PSA 06/07/12 - .20.

## 2013-01-30 LAB — DIGOXIN LEVEL: Digoxin Level: 0.7 ng/mL — ABNORMAL LOW (ref 0.8–2.0)

## 2013-02-02 ENCOUNTER — Encounter: Payer: Self-pay | Admitting: Internal Medicine

## 2013-02-02 NOTE — Assessment & Plan Note (Signed)
Followed by Dr Gollan.  Appears to be stable.    

## 2013-02-02 NOTE — Assessment & Plan Note (Signed)
Blood pressure under good control.  Follow.  Check metabolic panel.    

## 2013-02-02 NOTE — Assessment & Plan Note (Signed)
Stable.  Continues follow up with Dr Gollan.   

## 2013-02-02 NOTE — Assessment & Plan Note (Signed)
Discussed the importance of eating regular meals and appropriate snacks - including a bedtime snack.  Continue to follow.   Discussed the importance of checking sugars regularly and recording.   Will check metabolic panel and a1c.  Keep up to date with eye exams.

## 2013-02-02 NOTE — Assessment & Plan Note (Signed)
Last Cr 1.3.  Stable.   Follow renal function.

## 2013-02-02 NOTE — Assessment & Plan Note (Signed)
Had colonoscopy.  Results as outlined.   CT - no acute abnormality.  Pain better.  Still some issues with constipation.  Continue using miralax, stool softeners and MOM.

## 2013-02-02 NOTE — Assessment & Plan Note (Addendum)
Does have good and bad days.  Sob with exertion.  Overall appears to be stable. Continue current medication regimen.  Follows his weight.  Has a f/u with Dr Mariah Milling next month.

## 2013-02-02 NOTE — Assessment & Plan Note (Signed)
Symptoms controlled

## 2013-02-02 NOTE — Assessment & Plan Note (Signed)
Discussed at length with him last visit and this visit.  He has had colonoscopy and CT.  Unrevealing of any acute abnormality.  Continues to use miralax, stool softeners and MOM.    

## 2013-02-02 NOTE — Assessment & Plan Note (Signed)
Takes lasix regularly and doses (extra doses) - prn.  Currently stable.  Follow. Check metabolic panel.   

## 2013-02-02 NOTE — Assessment & Plan Note (Signed)
Off simvastatin.  Follow cholesterol.  Low cholesterol diet.    

## 2013-02-04 ENCOUNTER — Encounter: Payer: Self-pay | Admitting: *Deleted

## 2013-02-17 ENCOUNTER — Encounter: Payer: Self-pay | Admitting: Cardiovascular Disease

## 2013-02-17 ENCOUNTER — Ambulatory Visit (INDEPENDENT_AMBULATORY_CARE_PROVIDER_SITE_OTHER): Payer: Medicare Other | Admitting: Cardiovascular Disease

## 2013-02-17 VITALS — BP 106/58 | HR 72 | Ht 69.0 in | Wt 176.5 lb

## 2013-02-17 DIAGNOSIS — I10 Essential (primary) hypertension: Secondary | ICD-10-CM

## 2013-02-17 DIAGNOSIS — I2510 Atherosclerotic heart disease of native coronary artery without angina pectoris: Secondary | ICD-10-CM

## 2013-02-17 DIAGNOSIS — I25810 Atherosclerosis of coronary artery bypass graft(s) without angina pectoris: Secondary | ICD-10-CM

## 2013-02-17 DIAGNOSIS — R0602 Shortness of breath: Secondary | ICD-10-CM

## 2013-02-17 MED ORDER — METOPROLOL TARTRATE 25 MG PO TABS: 25.0000 mg | ORAL_TABLET | Freq: Two times a day (BID) | ORAL | Status: AC

## 2013-02-17 NOTE — Progress Notes (Signed)
Patient ID: Corey [name], male    DOB: [birth year], 77 y.o.   MRN: [id number]  HPI Comments: Corey [name] is a 77 year old male  with history of diabetes,  coronary artery disease, bypass surgery 1995, with a redo bypass, mitral valve annuloplasty ring placed,  ischemic cardiomyopathy, non-ST elevation MI in June 2013 with medical management recommended his coronary artery disease, ICD /defibrillator for ventricular tachycardia (notes indicating complete heart block), chronic atrial fibrillation,  who presented to the hospital in August 2013 with rash of the lower extremities and volume overload. He was taking Lasix 40 mg twice a day on presentation.  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  He completed  a course of steroids. He has refused statins in the past  Previous  nosebleeds x2  past week trips to be  emergency room twice.  He has chosen not to try any other anticoagulants given his  leg condition/vasculitis and nosebleeds.. On previous visits had problems with his stomach.  Today he reports having diffuse itching, dry skin. Also reports having his chronic shortness of breath. Strong positional component, unable to tie his shoes as he gets short of breath. Some shortness of breath with exertion which has been a chronic issue but very limiting. Has to go slowly when climbing stairs, going to get his mail.  Previous TEE June 2013 showing ejection fraction 20-25%, diffuse hypokinesis, moderate TR No significant recent edema  EKG today shows paced rhythm with a rate of 72 beats per minute, PVC noted     Outpatient Encounter Prescriptions as of 02/17/2013  Medication Sig  . acetaminophen (TYLENOL) 500 MG tablet Take 500 mg by mouth every 4 (four) hours as needed. For pain  . aspirin 81 MG tablet Take 81 mg by mouth daily.   . digoxin (LANOXIN) 0.125 MG tablet Take 1 tablet (0.125 mg total) by mouth  daily.  . furosemide (LASIX) 40 MG tablet Take 40 mg by mouth 2 (two) times daily.   Marland Kitchen glimepiride (AMARYL) 4 MG tablet 2 tablets q day  . isosorbide mononitrate (IMDUR) 60 MG 24 hr tablet TAKE 1 TABLET (60 MG TOTAL) BY MOUTH DAILY.  . metFORMIN (GLUCOPHAGE) 500 MG tablet TAKE 1 TABLET (500 MG TOTAL) BY MOUTH 2 (TWO) TIMES DAILY WITH A MEAL.  Marland Kitchen metolazone (ZAROXOLYN) 5 MG tablet Take 1 tablet (5 mg total) by mouth daily as needed.  . metoprolol (LOPRESSOR) 50 MG tablet Take 50 mg by mouth daily.   . nitroGLYCERIN (NITROSTAT) 0.4 MG SL tablet Place 0.4 mg under the tongue every 5 (five) minutes x 3 doses as needed. For chest pain  . polyethylene glycol powder (GLYCOLAX/MIRALAX) powder Take 17 g by mouth as needed.  . potassium chloride SA (K-DUR,KLOR-CON) 20 MEQ tablet Take 20 mEq by mouth daily.  . triazolam (HALCION) 0.25 MG tablet Take 1 tablet (0.25 mg total) by mouth at bedtime as needed. For sleep  . [DISCONTINUED] polyethylene glycol powder (MIRALAX) powder Take 17 g by mouth 2 (two) times daily.  . [DISCONTINUED] amoxicillin-clavulanate (AUGMENTIN) 500-125 MG per tablet Take 1 tablet (500 mg total) by mouth 2 (two) times daily.     Review of Systems  Constitutional: Positive for fatigue.  HENT: Negative.   Eyes: Negative.   Respiratory: Positive for shortness of breath.   Musculoskeletal: Negative.   Skin: Negative.        Itching  Neurological: Negative.  Psychiatric/Behavioral: Negative.   All other systems reviewed and are negative.    BP 106/58  Pulse 72  Ht 5\' 9"  (1.753 m)  Wt 176 lb 8 oz (80.06 kg)  BMI 26.05 kg/m2  Physical Exam  Nursing note and vitals reviewed. Constitutional: He is oriented to person, place, and time. He appears well-developed and well-nourished.  HENT:  Head: Normocephalic.  Nose: Nose normal.  Mouth/Throat: Oropharynx is clear and moist.  Eyes: Conjunctivae are normal. Pupils are equal, round, and reactive to light.  Neck: Normal range  of motion. Neck supple. No JVD present.  Cardiovascular: Normal rate, regular rhythm, S1 normal, S2 normal, normal heart sounds and intact distal pulses.  Exam reveals no gallop and no friction rub.   No murmur heard. Pulmonary/Chest: Effort normal and breath sounds normal. No respiratory distress. He has no wheezes. He has no rales. He exhibits no tenderness.  Abdominal: Soft. Bowel sounds are normal. He exhibits no distension. There is no tenderness.  Musculoskeletal: Normal range of motion. He exhibits no edema and no tenderness.  Lymphadenopathy:    He has no cervical adenopathy.  Neurological: He is alert and oriented to person, place, and time. Coordination normal.  Skin: Skin is warm and dry. No rash noted. No erythema.  Deep large wounds have healed, residual scars noted  Psychiatric: He has a normal mood and affect. His behavior is normal. Judgment and thought content normal.      Assessment and Plan

## 2013-02-17 NOTE — Assessment & Plan Note (Signed)
Blood pressure is well controlled on today's visit. No changes made to the medications. 

## 2013-02-17 NOTE — Patient Instructions (Signed)
You are doing well. Please take  metoprolol in the Am and PM  We will schedule you to see Dr. Graciela Husbands in [facility] for ICD  check  Please call us if you have new issues that need to be addressed before your next appt.  Your physician wants you to follow-up in: 6 months.  You will receive a reminder letter in the mail two months in advance. If you don't receive a letter, please call our office to schedule the follow-up appointment.

## 2013-02-17 NOTE — Assessment & Plan Note (Signed)
He has refused statins in the past. This was discussed with him and he does not want the side effects.

## 2013-02-17 NOTE — Assessment & Plan Note (Signed)
We'll set him up to see Dr. Hurman Horn

## 2013-02-17 NOTE — Assessment & Plan Note (Signed)
Chronic severe shortness of breath likely from underlying ischemic cardiomyopathy. We did discuss additional testing such as stress testing or catheterization. He did not seem to interested, more worried about his itching.

## 2013-02-17 NOTE — Assessment & Plan Note (Signed)
Chronic shortness of breath, underlying severe cardiomyopathy, ischemic. Unable to distinguish worsening shortness of breath from his baseline symptoms. For now we have recommended medical management. Biggest complaint is itching and positional shortness of breath.

## 2013-03-04 ENCOUNTER — Encounter (INDEPENDENT_AMBULATORY_CARE_PROVIDER_SITE_OTHER): Payer: Self-pay

## 2013-03-04 ENCOUNTER — Ambulatory Visit (INDEPENDENT_AMBULATORY_CARE_PROVIDER_SITE_OTHER): Payer: Medicare Other | Admitting: *Deleted

## 2013-03-04 DIAGNOSIS — E538 Deficiency of other specified B group vitamins: Secondary | ICD-10-CM

## 2013-03-04 MED ORDER — CYANOCOBALAMIN 1000 MCG/ML IJ SOLN
1000.0000 ug | Freq: Once | INTRAMUSCULAR | Status: AC
  Administered 2013-03-04: 1000 ug via INTRAMUSCULAR

## 2013-03-11 ENCOUNTER — Encounter: Payer: Self-pay | Admitting: Internal Medicine

## 2013-03-11 ENCOUNTER — Ambulatory Visit (INDEPENDENT_AMBULATORY_CARE_PROVIDER_SITE_OTHER): Payer: Medicare Other | Admitting: Internal Medicine

## 2013-03-11 VITALS — BP 124/75 | HR 72 | Ht 69.0 in | Wt 174.8 lb

## 2013-03-11 DIAGNOSIS — Z95810 Presence of automatic (implantable) cardiac defibrillator: Secondary | ICD-10-CM

## 2013-03-11 DIAGNOSIS — I4891 Unspecified atrial fibrillation: Secondary | ICD-10-CM

## 2013-03-11 DIAGNOSIS — R0602 Shortness of breath: Secondary | ICD-10-CM

## 2013-03-11 DIAGNOSIS — I2589 Other forms of chronic ischemic heart disease: Secondary | ICD-10-CM

## 2013-03-11 DIAGNOSIS — R000 Tachycardia, unspecified: Secondary | ICD-10-CM

## 2013-03-11 DIAGNOSIS — I5023 Acute on chronic systolic (congestive) heart failure: Secondary | ICD-10-CM

## 2013-03-11 DIAGNOSIS — I5022 Chronic systolic (congestive) heart failure: Secondary | ICD-10-CM

## 2013-03-11 LAB — MDC_IDC_ENUM_SESS_TYPE_INCLINIC
HIGH POWER IMPEDANCE MEASURED VALUE: 53 Ohm
HighPow Impedance: 35 Ohm
Implantable Pulse Generator Serial Number: 104248
Lead Channel Impedance Value: 599 Ohm
Lead Channel Impedance Value: 677 Ohm
Lead Channel Pacing Threshold Amplitude: 0.6 V
Lead Channel Pacing Threshold Pulse Width: 1 ms
Lead Channel Sensing Intrinsic Amplitude: 2.2 mV
Lead Channel Sensing Intrinsic Amplitude: 20.8 mV
Lead Channel Sensing Intrinsic Amplitude: 22.9 mV
Lead Channel Setting Pacing Pulse Width: 0.4 ms
Lead Channel Setting Sensing Sensitivity: 1 mV
MDC IDC MSMT BATTERY REMAINING LONGEVITY: 72 mo
MDC IDC MSMT LEADCHNL LV PACING THRESHOLD AMPLITUDE: 2.4 V
MDC IDC MSMT LEADCHNL RV IMPEDANCE VALUE: 680 Ohm
MDC IDC MSMT LEADCHNL RV PACING THRESHOLD PULSEWIDTH: 0.4 ms
MDC IDC SESS DTM: 20150106050000
MDC IDC SET LEADCHNL LV PACING AMPLITUDE: 3.5 V
MDC IDC SET LEADCHNL LV PACING PULSEWIDTH: 1 ms
MDC IDC SET LEADCHNL RA PACING AMPLITUDE: 3 V
MDC IDC SET LEADCHNL RV PACING AMPLITUDE: 2.4 V
MDC IDC SET LEADCHNL RV SENSING SENSITIVITY: 0.5 mV
MDC IDC SET ZONE DETECTION INTERVAL: 286 ms
MDC IDC SET ZONE DETECTION INTERVAL: 462 ms
MDC IDC STAT BRADY RV PERCENT PACED: 94 %
Zone Setting Detection Interval: 353 ms

## 2013-03-11 MED ORDER — ISOSORBIDE MONONITRATE ER 60 MG PO TB24: 90.0000 mg | ORAL_TABLET | Freq: Every day | ORAL | Status: DC

## 2013-03-11 NOTE — Assessment & Plan Note (Signed)
The patient's device was interrogated.  The information was reviewed. No changes were made in the programming.    

## 2013-03-11 NOTE — Progress Notes (Signed)
Patient Care Team: Alisa Graff, MD as PCP - General (Internal Medicine)   HPI  Corey [name] is a 78 y.o. male Prior patient of [facility] who has a history of CRT-D implantation for complete heart block ischemic cardiomyopathy and persistent atrial fibrillation. He has a history of Coumadin induced vasculitis. Previously he was treated with amiodarone for atrial fibrillation which he failed to tolerate; previously he was on dofetilide.  Most recent anticoagulation has been with aspirin; he has also been managed with digoxin. Most recent echocardiogram with TEE 6/13 demonstrating left ventricular dysfunction with an EF of 20-25  He has chronic exertional and nonexertional throat discomfort. It is his impression that this is angina  He has a history of bypass surgery in the 90s with redo bypass and mitral valve repair in 2006. He's had multiple stents. He had a non-STEMI 6/13.cath at that time demonstrated>>Left ventriculography: There is very severe left ventricular segmental systolic dysfunction. The entire apex is akinetic. The basal anterior wall and basal inferior wall are hypokinetic. The estimated left ventricular ejection fraction is 20%.  Final Conclusions:  1. Severe native multivessel coronary artery disease  2. Status post CABG with continued patency of the LIMA to LAD and continued patency of the saphenous vein graft sequence to OM1 and OM 2.  3. Total occlusion of the saphenous vein graft to right coronary artery  4. Chronic occlusion of the saphenous vein graft to diagonal  5. Severe segmental LV systolic dysfunction  There was discussion of staged intervention which apparently did not happen as it was deferred until his atrial fibrillation was addressed.  In July 2013 his ACE inhibitor was held because of mild renal insufficiency. He was never restarted   Past Medical History  Diagnosis Date  . Atrial fibrillation     s/p ablation, on tikosyn  . CAD, ARTERY BYPASS  GRAFT     4v CABG w/ redo '06; cath 08/14/11 high grade lesion in OM graft, total occlusion of SVG to RCA with collaterals,  patent  LIMA to LAD,occlusion of SVG to Diagonal, and severe segmental LV systolic dysfunction, EF 07%   . CARDIOMYOPATHY, ISCHEMIC   . Chronic systolic heart failure   . Dysuria   . HYPERLIPIDEMIA-MIXED   . RENAL INSUFFICIENCY   . Mitral regurgitation      s/p MV repair '06  . HTN (hypertension)   . COPD (chronic obstructive pulmonary disease)   . Complication of anesthesia     "liked to went crazy w/the mess they gave me; started w/an  A"  . CHF (congestive heart failure)   . Myocardial infarction 1987; ~ 08/17/11  . Anginal pain   . Pacemaker 08/24/11  . ICD (implantable cardiac defibrillator) in place 08/24/11  . Pneumonia     h/o "walking pneumonia; long time ago"  . Shortness of breath 08/24/11    "all the time"  . Type II diabetes mellitus   . Arthritis     "in the neck after neck OR"  . Benign neoplasm of colon 08/29/2012  . Personal history of colonic adenoma 08/29/2012  . GERD (gastroesophageal reflux disease)   . Anemia   . History of warfarin toxicity     skin necrosis    Past Surgical History  Procedure Laterality Date  . Mitral valve repair      with an angioplasty ring  . Cervical discectomy      C3-C4  . Cardiac defibrillator placement  ~ 2007  [employer]  . Coronary artery bypass graft  1995    CABG X 2  . Coronary artery bypass graft  2002    CABG "& fixed hole in back of the heart"  . Coronary angioplasty with stent placement      "2 I think"  . Inguinal hernia repair Bilateral   . Excisional hemorrhoidectomy    . Anterior fusion cervical spine      "went down in my throat to do it"  . Tee without cardioversion  08/30/2011    Procedure: TRANSESOPHAGEAL ECHOCARDIOGRAM (TEE);  Surgeon: Jolaine Artist, MD;  Location: [facility] ENDOSCOPY;  Service: Cardiovascular;  Laterality: N/A;  . Cardioversion  08/30/2011    Procedure: CARDIOVERSION;   Surgeon: Jolaine Artist, MD;  Location: [facility] ENDOSCOPY;  Service: Cardiovascular;  Laterality: N/A;  . Mass biopsy  2013    nasal eposcopic  . Colonoscopy N/A 08/29/2012    Procedure: COLONOSCOPY;  Surgeon: Gatha Mayer, MD;  Location: [facility] ENDOSCOPY;  Service: Endoscopy;  Laterality: N/A;    Current Outpatient Prescriptions  Medication Sig Dispense Refill  . acetaminophen (TYLENOL) 500 MG tablet Take 500 mg by mouth every 4 (four) hours as needed. For pain      . aspirin 81 MG tablet Take 81 mg by mouth daily.       . digoxin (LANOXIN) 0.125 MG tablet Take 1 tablet (0.125 mg total) by mouth daily.  90 tablet  3  . furosemide (LASIX) 40 MG tablet Take 40 mg by mouth 2 (two) times daily.       Marland Kitchen glimepiride (AMARYL) 4 MG tablet 2 tablets q day  60 tablet  1  . isosorbide mononitrate (IMDUR) 60 MG 24 hr tablet TAKE 1 TABLET (60 MG TOTAL) BY MOUTH DAILY.  30 tablet  3  . metFORMIN (GLUCOPHAGE) 500 MG tablet TAKE 1 TABLET (500 MG TOTAL) BY MOUTH 2 (TWO) TIMES DAILY WITH A MEAL.  60 tablet  3  . metolazone (ZAROXOLYN) 5 MG tablet Take 1 tablet (5 mg total) by mouth daily as needed.  30 tablet  6  . metoprolol (LOPRESSOR) 25 MG tablet Take 1 tablet (25 mg total) by mouth 2 (two) times daily.  180 tablet  3  . nitroGLYCERIN (NITROSTAT) 0.4 MG SL tablet Place 0.4 mg under the tongue every 5 (five) minutes x 3 doses as needed. For chest pain      . polyethylene glycol powder (GLYCOLAX/MIRALAX) powder Take 17 g by mouth as needed.      . potassium chloride SA (K-DUR,KLOR-CON) 20 MEQ tablet Take 20 mEq by mouth daily.      . triazolam (HALCION) 0.25 MG tablet Take 1 tablet (0.25 mg total) by mouth at bedtime as needed. For sleep  30 tablet  1   No current facility-administered medications for this visit.    Allergies  Allergen Reactions  . Coumadin [Warfarin Sodium] Hives    Pt states he broke out all over his body even the bottom of his feet  . Lorazepam Other (See Comments)    "became  unresponsive"  . Codeine Other (See Comments)    "made skin crawl"    Review of Systems negative except from HPI and PMH  Physical Exam BP 124/75  Pulse 72  Ht 5\' 9"  (1.753 m)  Wt 174 lb 12 oz (79.266 kg)  BMI 25.79 kg/m2 Well developed and well nourished in no acute distress HENT normal E scleral and icterus clear Neck Supple JVP flat; carotids  brisk and full Clear to ausculation Device pocket well healed; without hematoma or erythema.  There is no tethering Regular rate and rhythm, no murmurs gallops or rub Soft with active bowel sounds No clubbing cyanosis no Edema Alert and oriented, grossly normal motor and sensory function Skin Warm and Dry  ECG demonstrates ventricular pacing with atrial fibrillation and occasional PVCs  Assessment and  Plan

## 2013-03-11 NOTE — Assessment & Plan Note (Signed)
The patient has prior MI severe left ventricular dysfunction and in revascularized coronary disease identified 6/13. Re\re intervention had been considered but was not consummated. He continues having ongoing angina. This is very life limiting. I will discuss this with Dr. Deidre Ala and consider review of his catheterization films and consideration of repeat catheterization and intervention. In the interim, we'll increase his isosorbide from 60--90 mg a day. He could also be considered for an old seen.  Furthermore, in weakness catheterization, would be my recommendation that his ACE inhibitor be resumed.  Dig level was 0.7.Marland Kitchen11/14;  Creatinine was 1.3  Furthermore, there may be some value changing his beta blocker. There better for metoprolol succinate. He has 94% ventricular pacing with his atrial fibrillation; there are needed suggest that greater than 90% is associated with mortality benefit.  AV junction ablation can be undertaken as an alternative.  The patient also has symptoms of congestive heart failure with exercise intolerance. There is no volume overload. Atrial fibrillation may be contributing as above.  Another issue related to atrial fibrillation is his anticoagulation. His a lot stroke risk would be estimated about 8% per year. With thepresence of Xa inhibitors, these should be considered. Right now he is reluctant.

## 2013-03-11 NOTE — Patient Instructions (Addendum)
Follow up with Device clinic in 3 months.   Make appt with Dr. Rockey Situ in 3 months   Your physician wants you to follow-up in: 1 year with Dr. Gari Crown will receive a reminder letter in the mail two months in advance. If you don't receive a letter, please call our office to schedule the follow-up appointment.  Your physician has recommended you make the following change in your medication:   Increase Imdur to 90 mg daily

## 2013-03-11 NOTE — Assessment & Plan Note (Signed)
As above.

## 2013-03-14 ENCOUNTER — Other Ambulatory Visit: Payer: Self-pay

## 2013-03-14 ENCOUNTER — Telehealth: Payer: Self-pay

## 2013-03-14 DIAGNOSIS — R0602 Shortness of breath: Secondary | ICD-10-CM

## 2013-03-14 DIAGNOSIS — Z01812 Encounter for preprocedural laboratory examination: Secondary | ICD-10-CM

## 2013-03-14 NOTE — Telephone Encounter (Signed)
Spoke w/ pt.  Dr. Rockey Situ had already spoken w/ him about cath.  Informed pt that cath is sched for 03/21/13 @ 8:30, he is to arrive at [place] entrance at 7:30. Pt to come in Mon 03/17/13 for preprocedure labs.  Last cxr 06/2012.

## 2013-03-17 ENCOUNTER — Ambulatory Visit (INDEPENDENT_AMBULATORY_CARE_PROVIDER_SITE_OTHER): Payer: Medicare Other | Admitting: *Deleted

## 2013-03-17 DIAGNOSIS — Z01812 Encounter for preprocedural laboratory examination: Secondary | ICD-10-CM

## 2013-03-17 DIAGNOSIS — R0602 Shortness of breath: Secondary | ICD-10-CM

## 2013-03-18 LAB — CBC WITH DIFFERENTIAL
BASOS: 1 %
Basophils Absolute: 0.1 10*3/uL (ref 0.0–0.2)
EOS ABS: 0.1 10*3/uL (ref 0.0–0.4)
EOS: 2 %
HCT: 42.3 % (ref 37.5–51.0)
HEMOGLOBIN: 14.7 g/dL (ref 12.6–17.7)
Immature Grans (Abs): 0 10*3/uL (ref 0.0–0.1)
Immature Granulocytes: 0 %
LYMPHS: 22 %
Lymphocytes Absolute: 1.3 10*3/uL (ref 0.7–3.1)
MCH: 32.7 pg (ref 26.6–33.0)
MCHC: 34.8 g/dL (ref 31.5–35.7)
MCV: 94 fL (ref 79–97)
Monocytes Absolute: 0.4 10*3/uL (ref 0.1–0.9)
Monocytes: 7 %
NEUTROS PCT: 68 %
Neutrophils Absolute: 4.2 10*3/uL (ref 1.4–7.0)
Platelets: 192 10*3/uL (ref 150–379)
RBC: 4.5 x10E6/uL (ref 4.14–5.80)
RDW: 16.4 % — ABNORMAL HIGH (ref 12.3–15.4)
WBC: 6.1 10*3/uL (ref 3.4–10.8)

## 2013-03-18 LAB — BASIC METABOLIC PANEL
BUN/Creatinine Ratio: 21 (ref 10–22)
BUN: 31 mg/dL — ABNORMAL HIGH (ref 8–27)
CO2: 27 mmol/L (ref 18–29)
CREATININE: 1.45 mg/dL — AB (ref 0.76–1.27)
Calcium: 9.6 mg/dL (ref 8.6–10.2)
Chloride: 94 mmol/L — ABNORMAL LOW (ref 97–108)
GFR calc Af Amer: 54 mL/min/{1.73_m2} — ABNORMAL LOW (ref >59)
GFR calc non Af Amer: 47 mL/min/{1.73_m2} — ABNORMAL LOW (ref >59)
GLUCOSE: 181 mg/dL — AB (ref 65–99)
POTASSIUM: 4.5 mmol/L (ref 3.5–5.2)
Sodium: 139 mmol/L (ref 134–144)

## 2013-03-18 LAB — PROTIME-INR
INR: 1.1 (ref 0.8–1.2)
Prothrombin Time: 10.7 s (ref 9.1–12.0)

## 2013-03-21 ENCOUNTER — Ambulatory Visit: Payer: Self-pay | Admitting: Cardiovascular Disease

## 2013-03-21 ENCOUNTER — Encounter: Payer: Self-pay | Admitting: Cardiovascular Disease

## 2013-03-21 DIAGNOSIS — I25810 Atherosclerosis of coronary artery bypass graft(s) without angina pectoris: Secondary | ICD-10-CM

## 2013-03-21 HISTORY — PX: CORONARY ANGIOPLASTY WITH STENT PLACEMENT: SHX49

## 2013-03-21 LAB — CK TOTAL AND CKMB (NOT AT ARMC)
CK, Total: 56 U/L (ref 35–232)
CK-MB: 1.4 ng/mL (ref 0.5–3.6)

## 2013-03-22 DIAGNOSIS — I10 Essential (primary) hypertension: Secondary | ICD-10-CM

## 2013-03-22 DIAGNOSIS — I5022 Chronic systolic (congestive) heart failure: Secondary | ICD-10-CM

## 2013-03-22 LAB — BASIC METABOLIC PANEL
Anion Gap: 6 — ABNORMAL LOW (ref 7–16)
BUN: 24 mg/dL — AB (ref 7–18)
CO2: 29 mmol/L (ref 21–32)
CREATININE: 1.36 mg/dL — AB (ref 0.60–1.30)
Calcium, Total: 8.9 mg/dL (ref 8.5–10.1)
Chloride: 97 mmol/L — ABNORMAL LOW (ref 98–107)
EGFR (Non-African Amer.): 51 — ABNORMAL LOW
GFR CALC AF AMER: 59 — AB
GLUCOSE: 166 mg/dL — AB (ref 65–99)
Osmolality: 272 (ref 275–301)
POTASSIUM: 3.2 mmol/L — AB (ref 3.5–5.1)
Sodium: 132 mmol/L — ABNORMAL LOW (ref 136–145)

## 2013-03-22 LAB — DIGOXIN LEVEL: Digoxin: 1.11 ng/mL

## 2013-03-25 ENCOUNTER — Telehealth: Payer: Self-pay

## 2013-03-25 NOTE — Telephone Encounter (Signed)
Spoke w/ pt regarding staff message from Dr. Fletcher Anon:  "Discharged today after PCI.  Needs BMP on [place]. If renal function is stable, resume Metformin.  Schedule follow up with Dr. Rockey Situ in 10 days."  Pt sched to see Dr. Rockey Situ 04/02/13 @ 1:30, but states that he will not come in for BMP, as he is "done with the metformin". Asked if pt had spoken w/ his PCP about this, but he states that he does not know if Dr. Nicki Reaper is aware of his decision, but he will not restart the metformin. Advised pt to speak w/ Dr. Nicki Reaper about this and make sure that his diabetes is being treated.  He is agreeable to this and states that he will schedule appt for his "sugars".

## 2013-03-27 ENCOUNTER — Encounter: Payer: Self-pay | Admitting: Podiatrist

## 2013-03-27 ENCOUNTER — Ambulatory Visit (INDEPENDENT_AMBULATORY_CARE_PROVIDER_SITE_OTHER): Payer: Medicare Other | Admitting: Podiatrist

## 2013-03-27 ENCOUNTER — Ambulatory Visit (INDEPENDENT_AMBULATORY_CARE_PROVIDER_SITE_OTHER): Payer: Medicare Other

## 2013-03-27 VITALS — BP 117/64 | HR 69 | Resp 16 | Ht 69.0 in | Wt 168.0 lb

## 2013-03-27 DIAGNOSIS — M79673 Pain in unspecified foot: Secondary | ICD-10-CM

## 2013-03-27 DIAGNOSIS — M79609 Pain in unspecified limb: Secondary | ICD-10-CM

## 2013-03-27 DIAGNOSIS — M109 Gout, unspecified: Secondary | ICD-10-CM

## 2013-03-27 MED ORDER — TRIAMCINOLONE ACETONIDE 10 MG/ML IJ SUSP
10.0000 mg | Freq: Once | INTRAMUSCULAR | Status: AC
  Administered 2013-03-27: 10 mg

## 2013-03-27 NOTE — Patient Instructions (Signed)
Gout  Gout is when your joints become red, sore, and swell (inflammed). This is caused by the buildup of uric acid crystals in the joints. Uric acid is a chemical that is normally in the blood. If the level of uric acid gets too high in the blood, these crystals form in your joints and tissues. Over time, these crystals can form into masses near the joints and tissues. These masses can destroy bone and cause the bone to look misshapen (deformed).  HOME CARE   · Do not take aspirin for pain.  · Only take medicine as told by your doctor.  · Rest the joint as much as you can. When in bed, keep sheets and blankets off painful areas.  · Keep the sore joints raised (elevated).  · Put warm or cold packs on painful joints. Use of warm or cold packs depends on which works best for you.  · Use crutches if the painful joint is in your leg.  · Drink enough fluids to keep your pee (urine) clear or pale yellow. Limit alcohol, sugary drinks, and drinks with fructose in them.  · Follow your diet instructions. Pay careful attention to how much protein you eat. Include fruits, vegetables, whole grains, and fat-free or low-fat milk products in your daily diet. Talk to your doctor or dietician about the use of coffee, vitamin C, and cherries. These may help lower uric acid levels.  · Keep a healthy body weight.  GET HELP RIGHT AWAY IF:   · You have watery poop (diarrhea), throw up (vomit), or have any side effects from medicines.  · You do not feel better in 24 hours, or you are getting worse.  · Your joint becomes suddenly more tender, and you have chills or a fever.  MAKE SURE YOU:   · Understand these instructions.  · Will watch your condition.  · Will get help right away if you are not doing well or get worse.  Document Released: 11/30/2007 Document Revised: 06/17/2012 Document Reviewed: 05/31/2009  [employer]® Patient Information ©2014 [employer].

## 2013-03-27 NOTE — Progress Notes (Signed)
   Subjective:    Patient ID: Corey [name], male    DOB: 1935/10/09, 78 y.o.   MRN: [id number]  HPI Comments: N pain / swell L right foot around great toe joint D 2weeks O sudden C better/worse A shoes/walking T 0  Foot Pain Associated symptoms include a rash.      Review of Systems  Constitutional: Negative.   HENT: Negative.   Eyes: Positive for pain, redness and itching.  Respiratory: Positive for shortness of breath.        Difficulty breathing  Cardiovascular:       Calf pain when walking  Gastrointestinal: Negative.   Endocrine:       Increase urination  Genitourinary: Negative.   Musculoskeletal: Negative.   Skin: Positive for rash.  Allergic/Immunologic: Negative.   Neurological: Negative.   Hematological: Negative.   Psychiatric/Behavioral: Negative.        Objective:   Physical Exam Redness and swelling on the right foot at the great toe joint region.  Moderate swelling on the entire right side.pulses faintly palpable at 1/4 dp and pt bilateral.  Capillary refill time less than 3 seconds.  Neurological sensation intact both epicriticaly and protectively bilateral. Generalized discomfort is noted on the right foot first metatarsophalangeal joint region. Straight taken and reviewed which reveal arthritic changes at the first metatarsophalangeal joint right      Assessment & Plan:  Gout attack  Injected the joint with kenalog and marcaine mix under sterile technique.  If this does not improve the foot in 2-3 days he will call.  He had a gout flare in his heel in the past that responded to an injection therefore would like to hold off on any oral meds at this time.

## 2013-04-02 ENCOUNTER — Encounter: Payer: Self-pay | Admitting: Cardiovascular Disease

## 2013-04-02 ENCOUNTER — Ambulatory Visit (INDEPENDENT_AMBULATORY_CARE_PROVIDER_SITE_OTHER): Payer: Medicare Other | Admitting: Cardiovascular Disease

## 2013-04-02 VITALS — BP 110/68 | HR 71 | Ht 69.0 in | Wt 177.2 lb

## 2013-04-02 DIAGNOSIS — N189 Chronic kidney disease, unspecified: Secondary | ICD-10-CM

## 2013-04-02 DIAGNOSIS — I4891 Unspecified atrial fibrillation: Secondary | ICD-10-CM

## 2013-04-02 DIAGNOSIS — I25810 Atherosclerosis of coronary artery bypass graft(s) without angina pectoris: Secondary | ICD-10-CM

## 2013-04-02 DIAGNOSIS — R0602 Shortness of breath: Secondary | ICD-10-CM

## 2013-04-02 DIAGNOSIS — I5022 Chronic systolic (congestive) heart failure: Secondary | ICD-10-CM

## 2013-04-02 DIAGNOSIS — I10 Essential (primary) hypertension: Secondary | ICD-10-CM

## 2013-04-02 DIAGNOSIS — N259 Disorder resulting from impaired renal tubular function, unspecified: Secondary | ICD-10-CM

## 2013-04-02 NOTE — Progress Notes (Signed)
Patient ID: Corey [name], male    DOB: 05-10-1935, 78 y.o.   MRN: [id number]  HPI Comments: Corey [name] is a 78 year old male  with history of diabetes,  coronary artery disease, bypass surgery 1995, with a redo bypass, mitral valve annuloplasty ring placed,  ischemic cardiomyopathy, non-ST elevation MI in June 2013 with medical management recommended his coronary artery disease, ICD /defibrillator for ventricular tachycardia (notes indicating complete heart block), chronic atrial fibrillation,  who presented to the hospital in August 2013 with rash of the lower extremities and volume overload. He was taking Lasix 40 mg twice a day on presentation.  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  He completed  a course of steroids. He has refused statins in the past  In followup today, he had cardiac catheterization 03/21/2013 for shortness of breath. Stent was placed to 90% blocked vein graft to the OM. LIMA graft to the LAD was patent. He has 80% left main disease, 100% occluded left circumflex, proximal LAD and proximal RCA.  In followup today, he reports shortness of breath significantly improved until yesterday. He had relatively acute onset of shortness of breath, took extra Lasix dosing yesterday and today and now feels better after significant diuresis.  Previous  nosebleeds x2  He has chosen not to try any other anticoagulants given his  leg condition/vasculitis and nosebleeds. On previous visits had problems with his stomach.   Some shortness of breath with exertion which has been a chronic issue but very limiting. Has to go slowly when climbing stairs, going to get his mail.  Previous TEE June 2013 showing ejection fraction 20-25%, diffuse hypokinesis, moderate TR No significant recent edema  EKG today shows paced rhythm with a rate of 71 beats per minute, PVC noted     Outpatient Encounter  Prescriptions as of 04/02/2013  Medication Sig  . acetaminophen (TYLENOL) 500 MG tablet Take 500 mg by mouth every 4 (four) hours as needed. For pain  . aspirin 81 MG tablet Take 81 mg by mouth daily.   . clopidogrel (PLAVIX) 75 MG tablet Take 75 mg by mouth once.   . digoxin (LANOXIN) 0.125 MG tablet Take 1 tablet (0.125 mg total) by mouth daily.  . furosemide (LASIX) 40 MG tablet Take 40 mg by mouth 2 (two) times daily.   Marland Kitchen glimepiride (AMARYL) 4 MG tablet 2 tablets q day  . isosorbide mononitrate (IMDUR) 60 MG 24 hr tablet Take 1.5 tablets (90 mg total) by mouth daily.  . metolazone (ZAROXOLYN) 5 MG tablet Take 1 tablet (5 mg total) by mouth daily as needed.  . metoprolol (LOPRESSOR) 25 MG tablet Take 1 tablet (25 mg total) by mouth 2 (two) times daily.  . nitroGLYCERIN (NITROSTAT) 0.4 MG SL tablet Place 0.4 mg under the tongue every 5 (five) minutes x 3 doses as needed. For chest pain  . polyethylene glycol powder (GLYCOLAX/MIRALAX) powder Take 17 g by mouth as needed.  . potassium chloride SA (K-DUR,KLOR-CON) 20 MEQ tablet Take 20 mEq by mouth daily.  . triazolam (HALCION) 0.25 MG tablet Take 1 tablet (0.25 mg total) by mouth at bedtime as needed. For sleep     Review of Systems  HENT: Negative.   Eyes: Negative.   Respiratory: Positive for shortness of breath.   Cardiovascular: Negative.   Gastrointestinal: Negative.   Endocrine: Negative.   Musculoskeletal: Negative.   Skin: Negative.   Allergic/Immunologic: Negative.  Neurological: Negative.   Hematological: Negative.   Psychiatric/Behavioral: Negative.   All other systems reviewed and are negative.    BP 110/68  Pulse 71  Ht 5\' 9"  (1.753 m)  Wt 177 lb 4 oz (80.4 kg)  BMI 26.16 kg/m2  Physical Exam  Nursing note and vitals reviewed. Constitutional: He is oriented to person, place, and time. He appears well-developed and well-nourished.  HENT:  Head: Normocephalic.  Nose: Nose normal.  Mouth/Throat: Oropharynx is  clear and moist.  Eyes: Conjunctivae are normal. Pupils are equal, round, and reactive to light.  Neck: Normal range of motion. Neck supple. No JVD present.  Cardiovascular: Normal rate, regular rhythm, S1 normal, S2 normal, normal heart sounds and intact distal pulses.  Exam reveals no gallop and no friction rub.   No murmur heard. Pulmonary/Chest: Effort normal. No respiratory distress. He has decreased breath sounds. He has no wheezes. He has no rales. He exhibits no tenderness.  Abdominal: Soft. Bowel sounds are normal. He exhibits no distension. There is no tenderness.  Musculoskeletal: Normal range of motion. He exhibits no edema and no tenderness.  Lymphadenopathy:    He has no cervical adenopathy.  Neurological: He is alert and oriented to person, place, and time. Coordination normal.  Skin: Skin is warm and dry. No rash noted. No erythema.  Deep large wounds have healed, residual scars noted  Psychiatric: He has a normal mood and affect. His behavior is normal. Judgment and thought content normal.      Assessment and Plan

## 2013-04-02 NOTE — Patient Instructions (Addendum)
You are doing well. No medication changes were made.  If you get worsening shortness of breath take extra lasix If symptoms persist, call the office  We will check labs today  Please call us if you have new issues that need to be addressed before your next appt.  Your physician wants you to follow-up in: 6 months.  You will receive a reminder letter in the mail two months in advance. If you don't receive a letter, please call our office to schedule the follow-up appointment.

## 2013-04-02 NOTE — Assessment & Plan Note (Signed)
Previous reaction to warfarin. He is comfortable taking aspirin and Plavix. Does not want other anticoagulation

## 2013-04-02 NOTE — Assessment & Plan Note (Signed)
Blood pressure is well controlled on today's visit. No changes made to the medications. 

## 2013-04-02 NOTE — Assessment & Plan Note (Signed)
Recent stent placed to his vein graft to the OM on 03/21/2013. Patent LIMA graft to the LAD. Otherwise occluded native vessel coronary arteries

## 2013-04-02 NOTE — Assessment & Plan Note (Signed)
Recent exacerbation of his shortness of breath yesterday, better today after extra Lasix. Suggested he continue to take extra Lasix as needed for shortness of breath symptoms

## 2013-04-02 NOTE — Assessment & Plan Note (Signed)
We'll check his renal function today following cardiac catheterization 2 weeks ago

## 2013-04-03 ENCOUNTER — Other Ambulatory Visit: Payer: Self-pay | Admitting: Internal Medicine

## 2013-04-03 ENCOUNTER — Other Ambulatory Visit: Payer: Self-pay | Admitting: Cardiovascular Disease

## 2013-04-03 ENCOUNTER — Other Ambulatory Visit: Payer: Self-pay | Admitting: *Deleted

## 2013-04-03 DIAGNOSIS — I5023 Acute on chronic systolic (congestive) heart failure: Secondary | ICD-10-CM

## 2013-04-03 LAB — BASIC METABOLIC PANEL
BUN / CREAT RATIO: 16 (ref 10–22)
BUN: 28 mg/dL — AB (ref 8–27)
CO2: 26 mmol/L (ref 18–29)
CREATININE: 1.73 mg/dL — AB (ref 0.76–1.27)
Calcium: 9.6 mg/dL (ref 8.6–10.2)
Chloride: 94 mmol/L — ABNORMAL LOW (ref 97–108)
GFR calc non Af Amer: 38 mL/min/{1.73_m2} — ABNORMAL LOW (ref >59)
GFR, EST AFRICAN AMERICAN: 44 mL/min/{1.73_m2} — AB (ref >59)
Glucose: 203 mg/dL — ABNORMAL HIGH (ref 65–99)
Potassium: 4.4 mmol/L (ref 3.5–5.2)
SODIUM: 139 mmol/L (ref 134–144)

## 2013-04-03 MED ORDER — DIGOXIN 125 MCG PO TABS: 0.1250 mg | ORAL_TABLET | Freq: Every day | ORAL | Status: DC

## 2013-04-03 NOTE — Telephone Encounter (Signed)
Ok refill? 

## 2013-04-03 NOTE — Telephone Encounter (Signed)
Requested Prescriptions   Signed Prescriptions Disp Refills  . digoxin (LANOXIN) 0.125 MG tablet 90 tablet 3    Sig: Take 1 tablet (0.125 mg total) by mouth daily.    Authorizing Provider: Minna Merritts    Ordering User: Britt Bottom

## 2013-04-04 ENCOUNTER — Ambulatory Visit (INDEPENDENT_AMBULATORY_CARE_PROVIDER_SITE_OTHER): Payer: Medicare Other | Admitting: *Deleted

## 2013-04-04 DIAGNOSIS — E538 Deficiency of other specified B group vitamins: Secondary | ICD-10-CM

## 2013-04-04 MED ORDER — CYANOCOBALAMIN 1000 MCG/ML IJ SOLN
1000.0000 ug | Freq: Once | INTRAMUSCULAR | Status: AC
  Administered 2013-04-04: 1000 ug via INTRAMUSCULAR

## 2013-04-04 NOTE — Telephone Encounter (Signed)
Refilled halcion #30 with no refills.

## 2013-04-04 NOTE — Telephone Encounter (Signed)
Rx faxed

## 2013-04-14 ENCOUNTER — Encounter: Payer: Self-pay | Admitting: Physician Assistant

## 2013-04-14 ENCOUNTER — Ambulatory Visit (INDEPENDENT_AMBULATORY_CARE_PROVIDER_SITE_OTHER): Payer: Medicare Other | Admitting: Physician Assistant

## 2013-04-14 VITALS — BP 125/79 | HR 72 | Ht 69.0 in | Wt 176.0 lb

## 2013-04-14 DIAGNOSIS — I509 Heart failure, unspecified: Secondary | ICD-10-CM

## 2013-04-14 DIAGNOSIS — R0989 Other specified symptoms and signs involving the circulatory and respiratory systems: Secondary | ICD-10-CM

## 2013-04-14 DIAGNOSIS — I25810 Atherosclerosis of coronary artery bypass graft(s) without angina pectoris: Secondary | ICD-10-CM

## 2013-04-14 DIAGNOSIS — R0600 Dyspnea, unspecified: Secondary | ICD-10-CM

## 2013-04-14 DIAGNOSIS — I5023 Acute on chronic systolic (congestive) heart failure: Secondary | ICD-10-CM

## 2013-04-14 DIAGNOSIS — R0609 Other forms of dyspnea: Secondary | ICD-10-CM

## 2013-04-14 DIAGNOSIS — I5043 Acute on chronic combined systolic (congestive) and diastolic (congestive) heart failure: Secondary | ICD-10-CM | POA: Insufficient documentation

## 2013-04-14 DIAGNOSIS — I4891 Unspecified atrial fibrillation: Secondary | ICD-10-CM

## 2013-04-14 NOTE — Assessment & Plan Note (Signed)
Symptoms have progressed from NYHA class II at baseline to class III. Reviewing weight trend, he has gained 8 lbs since 1/22. Lasix was held 1/28 in the setting of A/CKD post-cath. Suspect 168 lbs is closer to baseline. Advised to continue Lasix 40mg  BID, continue metolazone PRN for persistent symptoms. Recommended he not take more than one metolazone per day, and no more than twice more this week. He continues to have significant UOP. Will see back in 1 week. F/u BMET at that time.

## 2013-04-14 NOTE — Addendum Note (Signed)
Addended by: Britt Bottom on: 04/14/2013 03:52 PM   Modules accepted: Orders

## 2013-04-14 NOTE — Assessment & Plan Note (Signed)
V-paced, underlying atrial fibrillation. Coumadin caused skin necrosis previously. Defers alternative anticoagulation. Continued on ASA/Plavix.

## 2013-04-14 NOTE — Addendum Note (Signed)
Addended by: Danella Sensing A on: 04/14/2013 07:09 PM   Modules accepted: Level of Service

## 2013-04-14 NOTE — Progress Notes (Addendum)
Patient ID: Corey [name], male   DOB: Aug 25, 1935, 78 y.o.   MRN: [id number]   Date:  04/14/2013   ID:  Corey [name], DOB 07/30/1935, MRN [id number]  PCP:  Alisa Graff, MD  Primary Cardiologist:  Johnny Bridge, MD Primary Electrophysiologist:  Olin Pia, MD   History of Present Illness:  Corey [name] is a 78 y.o. male w/ an extensive PMHx including CAD s/p CABG 1995, redo, MR s/p annuloplasty ring (2006), ischemic cardiomyopathy (EF 20-25%), h/o complete HB s/p ICD, permanent atrial fibrillation s/p RFA, previously on Tikosyn, h/o Coumadin vasculitis, DM2, HTN, HLD and GERD who presents today c/o dyspnea.   Last cardiac catheterization- 03/21/13- due to dyspnea- 90% VG-OM lesion s/p PCI, known occluded VG-RCA and VG-diag, patent LIMA-LAD, 80% LM, 100% LCx, prox LAD and prox RCA disease. Aggressive medical therapy and hydration for contrast exposure in setting of CKD recommended. Cr 1.7 on follow-up 1/28.  At that time, he had c/o of sudden onset, resting dyspnea, which was alleviated by increased Lasix.   He reports experiencing progressively worsening DOE over the past 2-3 days. He notes increased orthopnea and PND. This was a similar scenario to 2 weeks ago. He took a metolazone yesterday, and three tabs of Lasix today. He reports increased UOP, but minimal relief of symptoms. He denies chest discomfort. No increased Na/fluid intake.   BP 125/79.  EKG: V-paced rhythm, underlying atrial fibrillation, 72 bpm, LAD  Wt Readings from Last 3 Encounters:  04/14/13 176 lb (79.833 kg)  04/02/13 177 lb 4 oz (80.4 kg)  03/27/13 168 lb (76.204 kg)     Past Medical History  Diagnosis Date  . 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 CAD, ARTERY BYPASS GRAFT     4v CABG w/ redo '06; cath 08/14/11 high grade lesion in OM graft, total occlusion of SVG to RCA with collaterals,  patent  LIMA to LAD,occlusion of SVG to Diagonal, and severe segmental LV  systolic dysfunction, EF 08%   . CARDIOMYOPATHY, ISCHEMIC     EF 20-25%  . Chronic systolic heart failure   . HYPERLIPIDEMIA-MIXED   . CKD (chronic kidney disease), stage III   . Mitral regurgitation      s/p MV repair '06  . HTN (hypertension)   . COPD (chronic obstructive pulmonary disease)   . Complication of anesthesia     "liked to went crazy w/the mess they gave me; started w/an  A"  . Myocardial infarction 1987; ~ 08/17/11  . Anginal pain   . ICD (implantable cardiac defibrillator) in place 08/24/11  . Type II diabetes mellitus   . Arthritis     "in the neck after neck OR"  . Benign neoplasm of colon 08/29/2012  . Personal history of colonic adenoma 08/29/2012  . GERD (gastroesophageal reflux disease)   . Anemia   . History of warfarin toxicity     skin necrosis    Current Outpatient Prescriptions  Medication Sig Dispense Refill  . acetaminophen (TYLENOL) 500 MG tablet Take 500 mg by mouth every 4 (four) hours as needed. For pain      . aspirin 81 MG tablet Take 81 mg by mouth daily.       . clopidogrel (PLAVIX) 75 MG tablet Take 75 mg by mouth once.       Marland Kitchen DIGOX 125 MCG tablet TAKE 1 TABLET (0.125 MG TOTAL) BY MOUTH DAILY.  90 tablet  1  .  digoxin (LANOXIN) 0.125 MG tablet Take 1 tablet (0.125 mg total) by mouth daily.  90 tablet  3  . furosemide (LASIX) 40 MG tablet Take 40 mg by mouth 2 (two) times daily.       Marland Kitchen glimepiride (AMARYL) 4 MG tablet 2 tablets q day  60 tablet  1  . isosorbide mononitrate (IMDUR) 60 MG 24 hr tablet Take 1.5 tablets (90 mg total) by mouth daily.  30 tablet  6  . metolazone (ZAROXOLYN) 5 MG tablet Take 1 tablet (5 mg total) by mouth daily as needed.  30 tablet  6  . metoprolol (LOPRESSOR) 25 MG tablet Take 1 tablet (25 mg total) by mouth 2 (two) times daily.  180 tablet  3  . nitroGLYCERIN (NITROSTAT) 0.4 MG SL tablet Place 0.4 mg under the tongue every 5 (five) minutes x 3 doses as needed. For chest pain      . polyethylene glycol powder  (GLYCOLAX/MIRALAX) powder Take 17 g by mouth as needed.      . potassium chloride SA (K-DUR,KLOR-CON) 20 MEQ tablet Take 20 mEq by mouth daily.      . triazolam (HALCION) 0.25 MG tablet TAKE 1 TABLET BY MOUTH AT BEDTIME  30 tablet  0   No current facility-administered medications for this visit.    Allergies:    Allergies  Allergen Reactions  . Coumadin [Warfarin Sodium] Hives    Pt states he broke out all over his body even the bottom of his feet  . Lorazepam Other (See Comments)    "became unresponsive"  . Codeine Other (See Comments)    "made skin crawl"    Social History:  The patient  reports that he quit smoking about 28 years ago. His smoking use included Cigarettes and Pipe. He has a 60 pack-year smoking history. He quit smokeless tobacco use about 55 years ago. His smokeless tobacco use included Snuff and Chew. He reports that he does not drink alcohol or use illicit drugs.   Family History:  Family History  Problem Relation Age of Onset  . Hypertension Mother   . Skin cancer Mother   . Heart disease Brother     x 3  . Leukemia Sister     Review of Systems: General: negative for chills, fever, night sweats or weight changes.  Cardiovascular: positive for SOB/DOE, PND, orthopnea, LE edema, negative for chest pain, palpitations Dermatological: negative for rash Respiratory: negative for cough or wheezing Urologic: negative for hematuria Abdominal: negative for nausea, vomiting, diarrhea, bright red blood per rectum, melena, or hematemesis Neurologic: negative for visual changes, syncope, or dizziness All other systems reviewed and are otherwise negative except as noted above.  PHYSICAL EXAM: VS:  BP 125/79  Pulse 72  Ht 5\' 9"  (1.753 m)  Wt 176 lb (79.833 kg)  BMI 25.98 kg/m2 Well nourished, well developed, in no acute distress HEENT: normal, PERRL Neck: no JVP 8 cm, no bruits Cardiac:  Normal, regular S1, S2, +S3; RRR; no murmur or gallops Lungs:  clear to  auscultation bilaterally, no wheezing, rhonchi or rales Abd: soft, nontender, no hepatomegaly, normoactive BS x 4 quads Ext: 1+ bilateral pedal edema, no cyanosis or clubbing Skin: warm and dry, cap refill < 2 sec Neuro:  CNs 2-12 intact, no focal abnormalities noted Musculoskeletal: strength and tone appropriate for age  Psych: normal affect

## 2013-04-14 NOTE — Progress Notes (Signed)
Patient ID: EIAN VANDERVELDEN, male   DOB: 1935/11/05, 78 y.o.   MRN: [id number] Error.

## 2013-04-14 NOTE — Addendum Note (Signed)
Addended by: Danella Sensing A on: 04/14/2013 04:25 PM   Modules accepted: Orders

## 2013-04-14 NOTE — Patient Instructions (Addendum)
Please continue Lasix 40mg  by mouth twice a day.   Take one metalazone as needed for persistent shortness of breath. Not more than 1 per day, no more than twice more this week. Please take an extra potassium supplement on days that you take metolazone.  We will see you back in 1 week with labwork.   Please call us back at the end of the week for an update on your weight and symptoms.  HEART FAILURE EDUCATION  Please limit fluid intake to less than 2 L per day.   Please limit salt intake to less than 2 grams of sodium per day.   Please wear compression stockings/elevate legs at rest.   Ensure blood pressure is well-controlled.   Avoid excessive alcohol intake.  Avoid excessive use of NSAIDs (such as ibuprofen, Aleve, Advil, Celebrex). Tylenol as needed will not worsen heart failure.   Weigh yourself daily. Take an extra diuretic (water pill) if weight increases by 3 lbs in one day or 5 lbs to two days.   Continue to monitor symptoms- shortness of breath, swelling in legs or abdomen, weight increase, reduced activity level, difficulty breathing while laying flat or waking up in the middle of the night short of breath.

## 2013-04-14 NOTE — Assessment & Plan Note (Signed)
S/p DES to VG-OM last month. Continue DAPT. If he continues to complain of dyspnea despite diuresis, will need to weigh the risks-benefits of repeat ischemic eval. Continue BB, NTG SL PRN.

## 2013-04-18 ENCOUNTER — Telehealth: Payer: Self-pay

## 2013-04-18 NOTE — Telephone Encounter (Signed)
Pt calling reporting his weight today: 163.3. States he "feels right good".Please call.

## 2013-04-20 DIAGNOSIS — I4891 Unspecified atrial fibrillation: Secondary | ICD-10-CM

## 2013-04-20 DIAGNOSIS — I10 Essential (primary) hypertension: Secondary | ICD-10-CM

## 2013-04-20 DIAGNOSIS — N259 Disorder resulting from impaired renal tubular function, unspecified: Secondary | ICD-10-CM

## 2013-04-20 DIAGNOSIS — N189 Chronic kidney disease, unspecified: Secondary | ICD-10-CM

## 2013-04-20 DIAGNOSIS — I25810 Atherosclerosis of coronary artery bypass graft(s) without angina pectoris: Secondary | ICD-10-CM

## 2013-04-20 DIAGNOSIS — I5022 Chronic systolic (congestive) heart failure: Secondary | ICD-10-CM

## 2013-04-20 NOTE — Telephone Encounter (Signed)
It would appear he has had significant weight loss on Lasix twice a day with metolazone If he feels better, would hold the metolazone and take only as needed for worsening shortness of breath and weight gain  If weight continues to drop, would take Lasix daily Would take Lasix twice a day for weight gain

## 2013-04-21 ENCOUNTER — Encounter: Payer: Self-pay | Admitting: Cardiovascular Disease

## 2013-04-21 ENCOUNTER — Ambulatory Visit (INDEPENDENT_AMBULATORY_CARE_PROVIDER_SITE_OTHER): Payer: Medicare Other

## 2013-04-21 ENCOUNTER — Ambulatory Visit (INDEPENDENT_AMBULATORY_CARE_PROVIDER_SITE_OTHER): Payer: Medicare Other | Admitting: Cardiovascular Disease

## 2013-04-21 VITALS — BP 120/80 | HR 71 | Ht 69.0 in | Wt 173.0 lb

## 2013-04-21 DIAGNOSIS — I5043 Acute on chronic combined systolic (congestive) and diastolic (congestive) heart failure: Secondary | ICD-10-CM

## 2013-04-21 DIAGNOSIS — R0602 Shortness of breath: Secondary | ICD-10-CM

## 2013-04-21 DIAGNOSIS — R0789 Other chest pain: Secondary | ICD-10-CM

## 2013-04-21 DIAGNOSIS — I509 Heart failure, unspecified: Secondary | ICD-10-CM

## 2013-04-21 DIAGNOSIS — I5023 Acute on chronic systolic (congestive) heart failure: Secondary | ICD-10-CM

## 2013-04-21 DIAGNOSIS — I25810 Atherosclerosis of coronary artery bypass graft(s) without angina pectoris: Secondary | ICD-10-CM

## 2013-04-21 MED ORDER — ISOSORBIDE MONONITRATE ER 60 MG PO TB24: 90.0000 mg | ORAL_TABLET | Freq: Every day | ORAL | Status: DC

## 2013-04-21 NOTE — Telephone Encounter (Signed)
Pt seen in clinic today.  

## 2013-04-21 NOTE — Progress Notes (Signed)
Patient ID: Corey [name], male    DOB: 04-19-1935, 78 y.o.   MRN: [id number]  HPI Comments: Corey [name] is a 78 year old male  with history of diabetes,  coronary artery disease, bypass surgery 1995, with a redo bypass, mitral valve annuloplasty ring placed,  ischemic cardiomyopathy, non-ST elevation MI in June 2013 with medical management recommended his coronary artery disease, ICD /defibrillator for ventricular tachycardia (notes indicating complete heart block), chronic atrial fibrillation,  who presented to the hospital in August 2013 with rash of the lower extremities and volume overload. He was taking Lasix 40 mg twice a day on presentation.  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  He completed  a course of steroids. He has refused statins in the past   cardiac catheterization 03/21/2013 for shortness of breath. Stent was placed to 90% blocked vein graft to the OM. LIMA graft to the LAD was patent. He has 80% left main disease, 100% occluded left circumflex, proximal LAD and proximal RCA.  On his last 2 clinic visits, he reported having increasing shortness of breath. Possibly from IV fluids given during his catheterization. He was given metolazone which he has taken sparingly with significant improvement in his weight, leg swelling and his symptoms. Overall he is feeling well with no significant complaints He reports his stomach symptoms have also improved. Previously was having stomach discomfort on a chronic basis  Previous  nosebleeds x2  He has chosen not to try any other anticoagulants given his  leg condition/vasculitis and nosebleeds.  Previous TEE June 2013 showing ejection fraction 20-25%, diffuse hypokinesis, moderate TR No significant recent edema  EKG today shows paced rhythm with a rate of 71 beats per minute    Outpatient Encounter Prescriptions as of 04/21/2013  Medication Sig   . acetaminophen (TYLENOL) 500 MG tablet Take 500 mg by mouth every 4 (four) hours as needed. For pain  . aspirin 81 MG tablet Take 81 mg by mouth daily.   . clopidogrel (PLAVIX) 75 MG tablet Take 75 mg by mouth once.   . digoxin (LANOXIN) 0.125 MG tablet Take 1 tablet (0.125 mg total) by mouth daily.  . furosemide (LASIX) 40 MG tablet Take 40 mg by mouth 2 (two) times daily.   Marland Kitchen glimepiride (AMARYL) 4 MG tablet 2 tablets q day  . isosorbide mononitrate (IMDUR) 60 MG 24 hr tablet Take 1.5 tablets (90 mg total) by mouth daily.  . metolazone (ZAROXOLYN) 5 MG tablet Take 1 tablet (5 mg total) by mouth daily as needed.  . metoprolol (LOPRESSOR) 25 MG tablet Take 1 tablet (25 mg total) by mouth 2 (two) times daily.  . nitroGLYCERIN (NITROSTAT) 0.4 MG SL tablet Place 0.4 mg under the tongue every 5 (five) minutes x 3 doses as needed. For chest pain  . polyethylene glycol powder (GLYCOLAX/MIRALAX) powder Take 17 g by mouth as needed.  . potassium chloride SA (K-DUR,KLOR-CON) 20 MEQ tablet Take 20 mEq by mouth daily.  . triazolam (HALCION) 0.25 MG tablet TAKE 1 TABLET BY MOUTH AT BEDTIME    Review of Systems  Constitutional: Negative.   HENT: Negative.   Eyes: Negative.   Respiratory: Positive for shortness of breath.   Cardiovascular: Negative.   Gastrointestinal: Negative.   Endocrine: Negative.   Musculoskeletal: Negative.   Skin: Negative.   Allergic/Immunologic: Negative.   Neurological: Negative.   Hematological: Negative.   Psychiatric/Behavioral: Negative.   All other  systems reviewed and are negative.    BP 120/80  Pulse 71  Ht 5\' 9"  (1.753 m)  Wt 173 lb (78.472 kg)  BMI 25.54 kg/m2  Physical Exam  Nursing note and vitals reviewed. Constitutional: He is oriented to person, place, and time. He appears well-developed and well-nourished.  HENT:  Head: Normocephalic.  Nose: Nose normal.  Mouth/Throat: Oropharynx is clear and moist.  Eyes: Conjunctivae are normal. Pupils  are equal, round, and reactive to light.  Neck: Normal range of motion. Neck supple. No JVD present.  Cardiovascular: Normal rate, regular rhythm, S1 normal, S2 normal, normal heart sounds and intact distal pulses.  Exam reveals no gallop and no friction rub.   No murmur heard. Pulmonary/Chest: Effort normal. No respiratory distress. He has decreased breath sounds. He has no wheezes. He has no rales. He exhibits no tenderness.  Abdominal: Soft. Bowel sounds are normal. He exhibits no distension. There is no tenderness.  Musculoskeletal: Normal range of motion. He exhibits no edema and no tenderness.  Lymphadenopathy:    He has no cervical adenopathy.  Neurological: He is alert and oriented to person, place, and time. Coordination normal.  Skin: Skin is warm and dry. No rash noted. No erythema.  Deep large wounds have healed, residual scars noted  Psychiatric: He has a normal mood and affect. His behavior is normal. Judgment and thought content normal.      Assessment and Plan

## 2013-04-21 NOTE — Assessment & Plan Note (Signed)
Currently with no symptoms of angina. No further workup at this time. Continue current medication regimen. 

## 2013-04-21 NOTE — Patient Instructions (Addendum)
You are doing well. No medication changes were made.  Please continue to take 1/2 or whole metolazone as needed for shortness of breath or weight gain Continue lasix twice a day  Please call us if you have new issues that need to be addressed before your next appt.  Your physician wants you to follow-up in: 3 months.  You will receive a reminder letter in the mail two months in advance. If you don't receive a letter, please call our office to schedule the follow-up appointment.

## 2013-04-21 NOTE — Assessment & Plan Note (Signed)
Feels better after taking metolazone when necessary. We have recommended that he take one half or whole metolazone as needed for worsening leg edema or shortness of breath.

## 2013-04-21 NOTE — Assessment & Plan Note (Signed)
Symptoms improved with metolazone when necessary. We have suggested he continue on Lasix twice a day

## 2013-04-22 LAB — BASIC METABOLIC PANEL
BUN / CREAT RATIO: 26 — AB (ref 10–22)
BUN: 39 mg/dL — AB (ref 8–27)
CO2: 26 mmol/L (ref 18–29)
CREATININE: 1.48 mg/dL — AB (ref 0.76–1.27)
Calcium: 9.3 mg/dL (ref 8.6–10.2)
Chloride: 95 mmol/L — ABNORMAL LOW (ref 97–108)
GFR, EST AFRICAN AMERICAN: 53 mL/min/{1.73_m2} — AB (ref >59)
GFR, EST NON AFRICAN AMERICAN: 46 mL/min/{1.73_m2} — AB (ref >59)
Glucose: 364 mg/dL — ABNORMAL HIGH (ref 65–99)
Potassium: 5 mmol/L (ref 3.5–5.2)
SODIUM: 139 mmol/L (ref 134–144)

## 2013-05-02 ENCOUNTER — Other Ambulatory Visit: Payer: Self-pay | Admitting: Cardiovascular Disease

## 2013-05-05 ENCOUNTER — Encounter: Payer: Self-pay | Admitting: Internal Medicine

## 2013-05-05 ENCOUNTER — Ambulatory Visit (INDEPENDENT_AMBULATORY_CARE_PROVIDER_SITE_OTHER): Payer: Medicare Other | Admitting: Internal Medicine

## 2013-05-05 ENCOUNTER — Encounter (INDEPENDENT_AMBULATORY_CARE_PROVIDER_SITE_OTHER): Payer: Self-pay

## 2013-05-05 VITALS — BP 110/70 | HR 70 | Temp 98.1°F | Ht 69.0 in | Wt 173.8 lb

## 2013-05-05 DIAGNOSIS — I25810 Atherosclerosis of coronary artery bypass graft(s) without angina pectoris: Secondary | ICD-10-CM

## 2013-05-05 DIAGNOSIS — R21 Rash and other nonspecific skin eruption: Secondary | ICD-10-CM

## 2013-05-05 MED ORDER — TRIAMCINOLONE ACETONIDE 0.1 % EX CREA: 1.0000 "application " | TOPICAL_CREAM | Freq: Two times a day (BID) | CUTANEOUS | Status: DC

## 2013-05-05 NOTE — Progress Notes (Signed)
Pre-visit discussion using our clinic review tool. No additional management support is needed unless otherwise documented below in the visit note.  

## 2013-05-06 ENCOUNTER — Encounter: Payer: Self-pay | Admitting: Internal Medicine

## 2013-05-06 DIAGNOSIS — R21 Rash and other nonspecific skin eruption: Secondary | ICD-10-CM | POA: Insufficient documentation

## 2013-05-06 NOTE — Assessment & Plan Note (Signed)
Noticed increased itching and rash.  Better now.  Triamcinolone cream as directed.  Has antihistamine if needed.  Sees dermatology.  May need allergy testing if persistent.

## 2013-05-06 NOTE — Progress Notes (Signed)
Subjective:    Patient ID: Corey [name], male    DOB: [birth year], 78 y.o.   MRN: [id number]  Rash  78 year old male with past history of CAD s/p CABG x 2, chronic systolic heart failure, renal insufficiency, hypercholesterolemia, hypertension and diabetes.  He comes in today as a work in with concerns regarding itching and a rash.  States he woke up in the middle of the night with increased itching.  States had a rash.  Applied alcohol topically. Has improved now.  No rash and itching has improved.  Sees dermatology.  Has intermittently had some itching.  States dermatology has given him an injection previously.  Still some issues with his abdomen.  Seeing GI.  See Dr Ambrose Mantle note for details.   No vomiting.  No blood.  Had CT abdomen/pelvis - no acute finding.  Had colonoscopy.  No acute abnormality.   Breathing appears to be stable.  Still has good and bad days.  Continues to follow up with Dr Rockey Situ.      Past Medical History  Diagnosis Date  . 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 CAD, ARTERY BYPASS GRAFT     4v CABG w/ redo '06; cath 08/14/11 high grade lesion in OM graft, total occlusion of SVG to RCA with collaterals,  patent  LIMA to LAD,occlusion of SVG to Diagonal, and severe segmental LV systolic dysfunction, EF 54%   . CARDIOMYOPATHY, ISCHEMIC     EF 20-25%  . Chronic systolic heart failure   . HYPERLIPIDEMIA-MIXED   . CKD (chronic kidney disease), stage III   . Mitral regurgitation      s/p MV repair '06  . HTN (hypertension)   . COPD (chronic obstructive pulmonary disease)   . Complication of anesthesia     "liked to went crazy w/the mess they gave me; started w/an  A"  . Myocardial infarction 1987; ~ 08/17/11  . Anginal pain   . ICD (implantable cardiac defibrillator) in place 08/24/11  . Type II diabetes mellitus   . Arthritis     "in the neck after neck OR"  . Benign neoplasm of colon 08/29/2012  . Personal history of  colonic adenoma 08/29/2012  . GERD (gastroesophageal reflux disease)   . Anemia   . History of warfarin toxicity     skin necrosis     Outpatient Encounter Prescriptions as of 05/05/2013  Medication Sig  . acetaminophen (TYLENOL) 500 MG tablet Take 500 mg by mouth every 4 (four) hours as needed. For pain  . aspirin 81 MG tablet Take 81 mg by mouth daily.   . clopidogrel (PLAVIX) 75 MG tablet Take 75 mg by mouth once.   . digoxin (LANOXIN) 0.125 MG tablet Take 1 tablet (0.125 mg total) by mouth daily.  . furosemide (LASIX) 40 MG tablet Take 40 mg by mouth 2 (two) times daily.   Marland Kitchen glimepiride (AMARYL) 4 MG tablet 2 tablets q day  . isosorbide mononitrate (IMDUR) 60 MG 24 hr tablet Take 1.5 tablets (90 mg total) by mouth daily.  Marland Kitchen KLOR-CON M20 20 MEQ tablet TAKE 1 TABLET (20 MEQ TOTAL) BY MOUTH 2 (TWO) TIMES DAILY.  . metolazone (ZAROXOLYN) 5 MG tablet Take 1 tablet (5 mg total) by mouth daily as needed.  . metoprolol (LOPRESSOR) 25 MG tablet Take 1 tablet (25 mg total) by mouth 2 (two) times daily.  . nitroGLYCERIN (NITROSTAT) 0.4 MG SL tablet Place 0.4 mg  under the tongue every 5 (five) minutes x 3 doses as needed. For chest pain  . polyethylene glycol powder (GLYCOLAX/MIRALAX) powder Take 17 g by mouth as needed.  . potassium chloride SA (K-DUR,KLOR-CON) 20 MEQ tablet Take 20 mEq by mouth daily.  . triazolam (HALCION) 0.25 MG tablet TAKE 1 TABLET BY MOUTH AT BEDTIME  . triamcinolone cream (KENALOG) 0.1 % Apply 1 application topically 2 (two) times daily. Do not use in the same place for more than 7 day.    Review of Systems  Skin: Positive for rash.  Patient denies any headache, lightheadedness or dizziness.  No sinus or allergy symptoms.   No chest pain, tightness or palpitations currently.  Breathing appears to be stable.  No BRBPR or melana.  Some intermittent abdominalGI issues.  Itching and rash as outlined.  Better.         Objective:   Physical Exam  Filed Vitals:   05/05/13  0945  BP: 110/70  Pulse: 70  Temp: 98.1 F (17.76 C)   78 year old male in no acute distress.  NECK:  Supple.  Nontender.   HEART:  Appears to be regular.   LUNGS:  No crackles or wheezing audible.  Respirations even and unlabored.   RADIAL PULSE:  Equal bilaterally.  ABDOMEN:  Soft.  No significant tenderness noted today.  Bowel sounds present and normal.  No audible abdominal bruit.  No rebound or guarding.  SKIN:  No rash.          Assessment & Plan:  HEALTH MAINTENANCE.  Scheduled for a physical for next visit.  Colonoscopy per GI as outlined.  PSA 06/07/12 - .20.

## 2013-05-16 ENCOUNTER — Other Ambulatory Visit: Payer: Self-pay | Admitting: Internal Medicine

## 2013-06-16 ENCOUNTER — Encounter: Payer: Self-pay | Admitting: Internal Medicine

## 2013-06-16 ENCOUNTER — Ambulatory Visit (INDEPENDENT_AMBULATORY_CARE_PROVIDER_SITE_OTHER): Payer: Medicare Other | Admitting: Internal Medicine

## 2013-06-16 VITALS — BP 120/80 | HR 70 | Temp 97.8°F | Ht 68.5 in | Wt 172.5 lb

## 2013-06-16 DIAGNOSIS — N259 Disorder resulting from impaired renal tubular function, unspecified: Secondary | ICD-10-CM

## 2013-06-16 DIAGNOSIS — I2510 Atherosclerotic heart disease of native coronary artery without angina pectoris: Secondary | ICD-10-CM

## 2013-06-16 DIAGNOSIS — E538 Deficiency of other specified B group vitamins: Secondary | ICD-10-CM

## 2013-06-16 DIAGNOSIS — I10 Essential (primary) hypertension: Secondary | ICD-10-CM

## 2013-06-16 DIAGNOSIS — Z23 Encounter for immunization: Secondary | ICD-10-CM

## 2013-06-16 DIAGNOSIS — K5900 Constipation, unspecified: Secondary | ICD-10-CM

## 2013-06-16 DIAGNOSIS — K219 Gastro-esophageal reflux disease without esophagitis: Secondary | ICD-10-CM

## 2013-06-16 DIAGNOSIS — F3289 Other specified depressive episodes: Secondary | ICD-10-CM

## 2013-06-16 DIAGNOSIS — F329 Major depressive disorder, single episode, unspecified: Secondary | ICD-10-CM

## 2013-06-16 DIAGNOSIS — E785 Hyperlipidemia, unspecified: Secondary | ICD-10-CM

## 2013-06-16 DIAGNOSIS — E119 Type 2 diabetes mellitus without complications: Secondary | ICD-10-CM

## 2013-06-16 DIAGNOSIS — R5381 Other malaise: Secondary | ICD-10-CM

## 2013-06-16 DIAGNOSIS — R109 Unspecified abdominal pain: Secondary | ICD-10-CM

## 2013-06-16 DIAGNOSIS — F32A Depression, unspecified: Secondary | ICD-10-CM

## 2013-06-16 DIAGNOSIS — R5383 Other fatigue: Secondary | ICD-10-CM

## 2013-06-16 DIAGNOSIS — R0602 Shortness of breath: Secondary | ICD-10-CM

## 2013-06-16 DIAGNOSIS — I4891 Unspecified atrial fibrillation: Secondary | ICD-10-CM

## 2013-06-16 DIAGNOSIS — Z79899 Other long term (current) drug therapy: Secondary | ICD-10-CM

## 2013-06-16 DIAGNOSIS — I25810 Atherosclerosis of coronary artery bypass graft(s) without angina pectoris: Secondary | ICD-10-CM

## 2013-06-16 DIAGNOSIS — I2589 Other forms of chronic ischemic heart disease: Secondary | ICD-10-CM

## 2013-06-16 LAB — COMPREHENSIVE METABOLIC PANEL
ALK PHOS: 42 U/L (ref 39–117)
ALT: 21 U/L (ref 0–53)
AST: 26 U/L (ref 0–37)
Albumin: 4 g/dL (ref 3.5–5.2)
BUN: 31 mg/dL — ABNORMAL HIGH (ref 6–23)
CO2: 31 meq/L (ref 19–32)
CREATININE: 1.3 mg/dL (ref 0.4–1.5)
Calcium: 9.5 mg/dL (ref 8.4–10.5)
Chloride: 96 mEq/L (ref 96–112)
GFR: 59.78 mL/min — AB (ref >60.00)
Glucose, Bld: 148 mg/dL — ABNORMAL HIGH (ref 70–99)
Potassium: 3.8 mEq/L (ref 3.5–5.1)
Sodium: 139 mEq/L (ref 135–145)
Total Bilirubin: 2.2 mg/dL — ABNORMAL HIGH (ref 0.3–1.2)
Total Protein: 7.3 g/dL (ref 6.0–8.3)

## 2013-06-16 LAB — CBC WITH DIFFERENTIAL/PLATELET
BASOS PCT: 0.5 % (ref 0.0–3.0)
Basophils Absolute: 0 10*3/uL (ref 0.0–0.1)
Eosinophils Absolute: 0.1 10*3/uL (ref 0.0–0.7)
Eosinophils Relative: 1 % (ref 0.0–5.0)
HEMATOCRIT: 45.2 % (ref 39.0–52.0)
HEMOGLOBIN: 15 g/dL (ref 13.0–17.0)
LYMPHS PCT: 12.9 % (ref 12.0–46.0)
Lymphs Abs: 1 10*3/uL (ref 0.7–4.0)
MCHC: 33.1 g/dL (ref 30.0–36.0)
MCV: 99.4 fl (ref 78.0–100.0)
MONOS PCT: 6.9 % (ref 3.0–12.0)
Monocytes Absolute: 0.5 10*3/uL (ref 0.1–1.0)
NEUTROS ABS: 6 10*3/uL (ref 1.4–7.7)
Neutrophils Relative %: 78.7 % — ABNORMAL HIGH (ref 43.0–77.0)
Platelets: 166 10*3/uL (ref 150.0–400.0)
RBC: 4.55 Mil/uL (ref 4.22–5.81)
RDW: 17.2 % — ABNORMAL HIGH (ref 11.5–14.6)
WBC: 7.6 10*3/uL (ref 4.5–10.5)

## 2013-06-16 LAB — HEMOGLOBIN A1C: Hgb A1c MFr Bld: 8.7 % — ABNORMAL HIGH (ref 4.6–6.5)

## 2013-06-16 LAB — URINALYSIS, ROUTINE W REFLEX MICROSCOPIC
Bilirubin Urine: NEGATIVE
Hgb urine dipstick: NEGATIVE
Ketones, ur: NEGATIVE
Leukocytes, UA: NEGATIVE
Nitrite: NEGATIVE
PH: 6 (ref 5.0–8.0)
RBC / HPF: NONE SEEN (ref 0–?)
SPECIFIC GRAVITY, URINE: 1.02 (ref 1.000–1.030)
Total Protein, Urine: 100 — AB
URINE GLUCOSE: NEGATIVE
Urobilinogen, UA: 0.2 (ref 0.0–1.0)

## 2013-06-16 LAB — LIPID PANEL
Cholesterol: 151 mg/dL (ref 0–200)
HDL: 27.4 mg/dL — ABNORMAL LOW (ref >39.00)
LDL Cholesterol: 103 mg/dL — ABNORMAL HIGH (ref 0–99)
TRIGLYCERIDES: 101 mg/dL (ref 0.0–149.0)
Total CHOL/HDL Ratio: 6
VLDL: 20.2 mg/dL (ref 0.0–40.0)

## 2013-06-16 LAB — MICROALBUMIN / CREATININE URINE RATIO
CREATININE, U: 154.4 mg/dL
MICROALB/CREAT RATIO: 36.2 mg/g — AB (ref 0.0–30.0)
Microalb, Ur: 55.9 mg/dL — ABNORMAL HIGH (ref 0.0–1.9)

## 2013-06-16 LAB — TSH: TSH: 3.65 u[IU]/mL (ref 0.35–5.50)

## 2013-06-16 MED ORDER — CYANOCOBALAMIN 1000 MCG/ML IJ SOLN
1000.0000 ug | Freq: Once | INTRAMUSCULAR | Status: AC
  Administered 2013-06-16: 1000 ug via INTRAMUSCULAR

## 2013-06-16 MED ORDER — TRIAZOLAM 0.25 MG PO TABS: ORAL_TABLET | ORAL | Status: AC

## 2013-06-16 NOTE — Progress Notes (Signed)
Subjective:    Patient ID: Corey [name], male    DOB: Dec 11, 1935, 78 y.o.   MRN: [id number]  HPI 78 year old male with past history of CAD s/p CABG x 2, chronic systolic heart failure, renal insufficiency, hypercholesterolemia, hypertension and diabetes.  He comes in today to follow up on these issues as well as for a complete physical exam.  He reports his sugars are doing well.  Brought in no recorded sugar readings.  States am sugars are averaging 110-140 and reports not checking his pm sugars.  Have discussed the importance of eating regular meals and a snack before bed.  The abdominal gas has improved.  He is still having increased abdominal pain and discomfort.  When he eats, he feels like "things aren't passing through".  Has seen Dr Carlean Purl with GI.  See Dr Ambrose Mantle note for details.  Some persistent problems with constipation.  No vomiting.  No blood.  Had CT abdomen/pelvis - no acute finding.  Had colonoscopy.  No acute abnormality.   He states that his main complaint is that of persistent abdominal pain.  Breathing appears to be stable.  Still has good and bad days.  Still get sob with exertion.  Continues to follow up with Dr Rockey Situ.  We discussed his depression questionnaire.  He states he gets frustrated not being able to do what he wants to do.  No suicidal ideations. States he would not hurt himself.      Past Medical History  Diagnosis Date  . 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 CAD, ARTERY BYPASS GRAFT     4v CABG w/ redo '06; cath 08/14/11 high grade lesion in OM graft, total occlusion of SVG to RCA with collaterals,  patent  LIMA to LAD,occlusion of SVG to Diagonal, and severe segmental LV systolic dysfunction, EF 53%   . CARDIOMYOPATHY, ISCHEMIC     EF 20-25%  . Chronic systolic heart failure   . HYPERLIPIDEMIA-MIXED   . CKD (chronic kidney disease), stage III   . Mitral regurgitation      s/p MV repair '06  . HTN  (hypertension)   . COPD (chronic obstructive pulmonary disease)   . Complication of anesthesia     "liked to went crazy w/the mess they gave me; started w/an  A"  . Myocardial infarction 1987; ~ 08/17/11  . Anginal pain   . ICD (implantable cardiac defibrillator) in place 08/24/11  . Type II diabetes mellitus   . Arthritis     "in the neck after neck OR"  . Benign neoplasm of colon 08/29/2012  . Personal history of colonic adenoma 08/29/2012  . GERD (gastroesophageal reflux disease)   . Anemia   . History of warfarin toxicity     skin necrosis     Outpatient Encounter Prescriptions as of 06/16/2013  Medication Sig  . acetaminophen (TYLENOL) 500 MG tablet Take 500 mg by mouth every 4 (four) hours as needed. For pain  . aspirin 81 MG tablet Take 81 mg by mouth daily.   . clopidogrel (PLAVIX) 75 MG tablet Take 75 mg by mouth once.   . digoxin (LANOXIN) 0.125 MG tablet Take 1 tablet (0.125 mg total) by mouth daily.  . furosemide (LASIX) 40 MG tablet Take 40 mg by mouth 2 (two) times daily.   Marland Kitchen glimepiride (AMARYL) 4 MG tablet 2 tablets q day  . glimepiride (AMARYL) 4 MG tablet TAKE 2 TABLETS BY MOUTH  TWICE A DAY  . isosorbide mononitrate (IMDUR) 60 MG 24 hr tablet Take 1.5 tablets (90 mg total) by mouth daily.  Marland Kitchen KLOR-CON M20 20 MEQ tablet TAKE 1 TABLET (20 MEQ TOTAL) BY MOUTH 2 (TWO) TIMES DAILY.  . metolazone (ZAROXOLYN) 5 MG tablet Take 1 tablet (5 mg total) by mouth daily as needed.  . metoprolol (LOPRESSOR) 25 MG tablet Take 1 tablet (25 mg total) by mouth 2 (two) times daily.  . nitroGLYCERIN (NITROSTAT) 0.4 MG SL tablet Place 0.4 mg under the tongue every 5 (five) minutes x 3 doses as needed. For chest pain  . polyethylene glycol powder (GLYCOLAX/MIRALAX) powder Take 17 g by mouth as needed.  . potassium chloride SA (K-DUR,KLOR-CON) 20 MEQ tablet Take 20 mEq by mouth daily.  Marland Kitchen triamcinolone cream (KENALOG) 0.1 % Apply 1 application topically 2 (two) times daily. Do not use in the  same place for more than 7 day.  . triazolam (HALCION) 0.25 MG tablet TAKE 1 TABLET BY MOUTH AT BEDTIME    Review of Systems Patient denies any headache, lightheadedness or dizziness.  No sinus or allergy symptoms.   No chest pain, tightness or palpitations currently.  Gets sob with exertion.  Has to sit and rest.  Breathing appears to be stable.  No BRBPR or melana.  Does have issues with constipation.  See above.   Sugars per his report are better.  See above for sugar averages.  Brought in no recorded sugar readings.  Abdominal pain and discomfort as outlined.  Persistent.         Objective:   Physical Exam  Filed Vitals:   06/16/13 0823  BP: 120/80  Pulse: 70  Temp: 97.8 F (36.6 C)   Blood pressure recheck 54/2  78 year old male in no acute distress.  HEENT:  Nares - clear.  Oropharynx - without lesions. NECK:  Supple.  Nontender.  No audible carotid bruit.  HEART:  Rate controlled.    LUNGS:  No crackles or wheezing audible.  Respirations even and unlabored.   RADIAL PULSE:  Equal bilaterally.  ABDOMEN:  Soft.  No significant tenderness noted today.  Bowel sounds present and normal.  No audible abdominal bruit.  No rebound or guarding.  GU:  Not performed.   EXTREMITIES:  No increased edema present.  Stable.         Assessment & Plan:  COUGH.  Resolved.      HEALTH MAINTENANCE. Physical today.   Colonoscopy per GI as outlined.  PSA 06/07/12 - .20.    I spent 25 minutes with the patient and more than 50% of the time was spent in consultation regarding the above.

## 2013-06-16 NOTE — Progress Notes (Signed)
Pre-visit discussion using our clinic review tool. No additional management support is needed unless otherwise documented below in the visit note.  

## 2013-06-17 ENCOUNTER — Ambulatory Visit: Payer: Medicare Other

## 2013-06-17 LAB — BILIRUBIN, DIRECT: Bilirubin, Direct: 0.4 mg/dL — ABNORMAL HIGH (ref 0.0–0.3)

## 2013-06-17 LAB — DIGOXIN LEVEL: DIGOXIN LVL: 0.9 ng/mL (ref 0.8–2.0)

## 2013-06-19 ENCOUNTER — Encounter: Payer: Self-pay | Admitting: *Deleted

## 2013-06-21 ENCOUNTER — Encounter: Payer: Self-pay | Admitting: Internal Medicine

## 2013-06-21 DIAGNOSIS — F32A Depression, unspecified: Secondary | ICD-10-CM | POA: Insufficient documentation

## 2013-06-21 DIAGNOSIS — F329 Major depressive disorder, single episode, unspecified: Secondary | ICD-10-CM | POA: Insufficient documentation

## 2013-06-21 DIAGNOSIS — R109 Unspecified abdominal pain: Secondary | ICD-10-CM | POA: Insufficient documentation

## 2013-06-21 NOTE — Assessment & Plan Note (Signed)
Discussed at length with him today.  He denies any suicidal ideations.  States he would not hurt himself.  Discussed treatment options, including celexa, zoloft, etc.  He states he cannot tolerate "those medications" and desires not to start.  Will follow.

## 2013-06-21 NOTE — Assessment & Plan Note (Signed)
Followed by Dr Gollan.  Appears to be stable.    

## 2013-06-21 NOTE — Assessment & Plan Note (Signed)
Discussed the importance of eating regular meals and appropriate snacks - including a bedtime snack.  Continue to follow.   Discussed the importance of checking sugars regularly and recording.   Will check metabolic panel and E5U.  Keep up to date with eye exams.  Sugars as outlined.

## 2013-06-21 NOTE — Assessment & Plan Note (Signed)
Takes lasix regularly and doses (extra doses) - prn.  Currently stable.  Follow. Check metabolic panel.

## 2013-06-21 NOTE — Assessment & Plan Note (Signed)
Discussed at length with him last visit and this visit.  He has had colonoscopy and CT.  Unrevealing of any acute abnormality.  Continues to use miralax, stool softeners and MOM.

## 2013-06-21 NOTE — Assessment & Plan Note (Signed)
Last Cr stable.   Follow renal function.

## 2013-06-21 NOTE — Assessment & Plan Note (Signed)
Off simvastatin.  Follow cholesterol.  Low cholesterol diet.

## 2013-06-21 NOTE — Assessment & Plan Note (Signed)
Blood pressure under good control.  Follow.  Check metabolic panel.    

## 2013-06-21 NOTE — Assessment & Plan Note (Signed)
Stable.  Continues follow up with Dr Rockey Situ.

## 2013-06-21 NOTE — Assessment & Plan Note (Signed)
Symptoms controlled

## 2013-06-21 NOTE — Assessment & Plan Note (Signed)
Followed by Dr Rockey Situ.  Appears to be stable.

## 2013-06-21 NOTE — Assessment & Plan Note (Signed)
Does have good and bad days.  Sob with exertion.  Overall appears to be stable. Continue current medication regimen.  Follows his weight.

## 2013-06-21 NOTE — Assessment & Plan Note (Signed)
Persistent abdominal pain and cramping.  Has had CT and colonoscopy as outlined.  Unclear etiology.  States it feels as if "food not moving through".  Will refer back to GI to see if any further testing warranted and to discuss any further possible treatment options.

## 2013-06-23 NOTE — Progress Notes (Signed)
Pt notified & lab appt scheduled 

## 2013-06-25 ENCOUNTER — Telehealth: Payer: Self-pay | Admitting: *Deleted

## 2013-06-25 NOTE — Telephone Encounter (Signed)
Order placed for f/u liver panel.  

## 2013-06-25 NOTE — Telephone Encounter (Signed)
Pt is coming in tomorrow what labs and dx?  

## 2013-06-26 ENCOUNTER — Other Ambulatory Visit (INDEPENDENT_AMBULATORY_CARE_PROVIDER_SITE_OTHER): Payer: Medicare Other

## 2013-06-26 DIAGNOSIS — R17 Unspecified jaundice: Secondary | ICD-10-CM

## 2013-06-26 LAB — HEPATIC FUNCTION PANEL
ALBUMIN: 3.7 g/dL (ref 3.5–5.2)
ALT: 16 U/L (ref 0–53)
AST: 24 U/L (ref 0–37)
Alkaline Phosphatase: 46 U/L (ref 39–117)
BILIRUBIN DIRECT: 0.4 mg/dL — AB (ref 0.0–0.3)
Total Bilirubin: 1.4 mg/dL — ABNORMAL HIGH (ref 0.3–1.2)
Total Protein: 6.7 g/dL (ref 6.0–8.3)

## 2013-06-27 ENCOUNTER — Encounter: Payer: Self-pay | Admitting: *Deleted

## 2013-07-12 IMAGING — CR DG CHEST 2V
2 series · 2 of 2 positions shown · non-contrast
Comparison: 07/22/2008

CLINICAL DATA: Shortness of breath, pacemaker battery dead.

CHEST - 2 VIEW

[w chest pa]
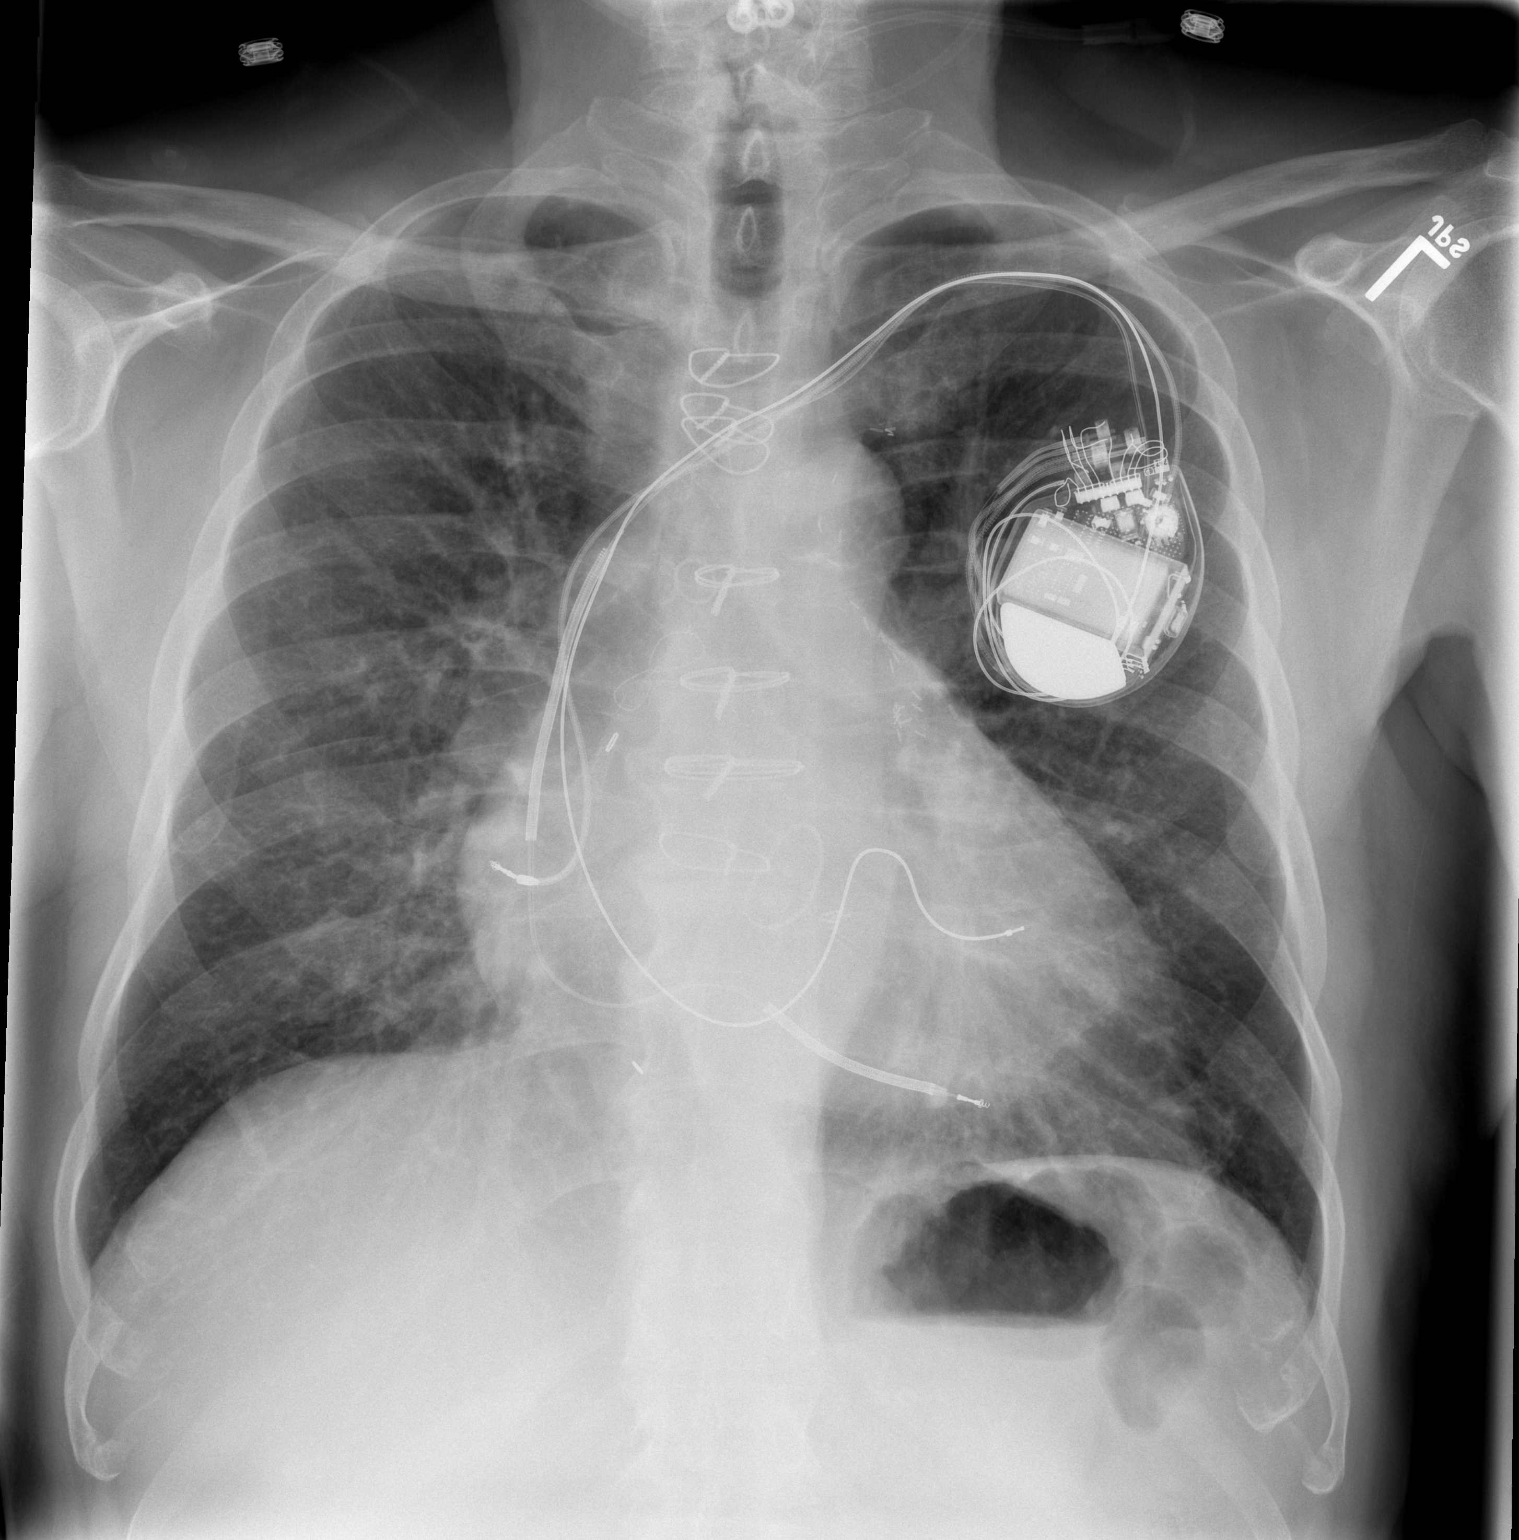

[w chest lat]
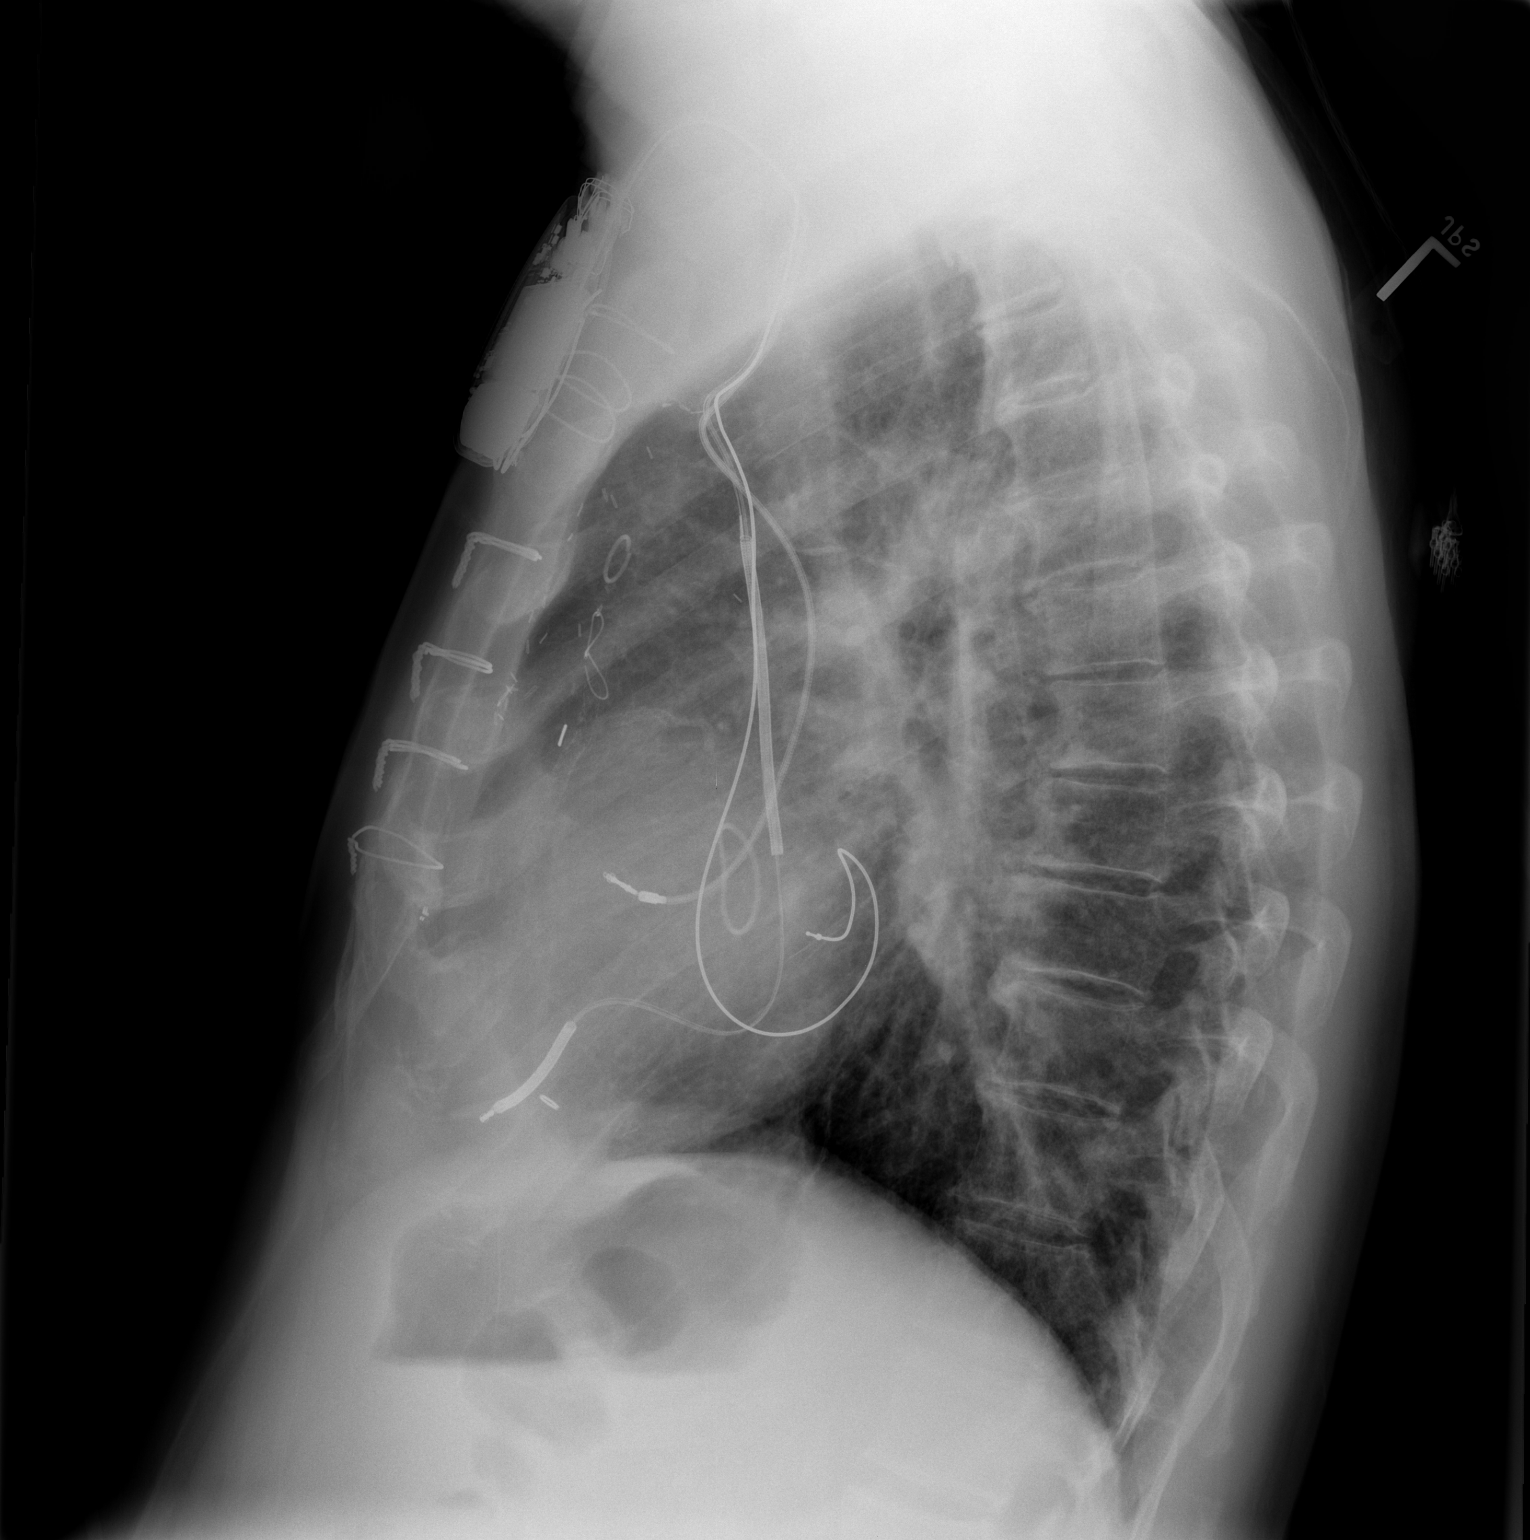

[2 of 2 positions shown; findings below may reference images not displayed]

FINDINGS: Lungs are essentially clear. No pleural effusion or
pneumothorax.

Stable cardiomegaly. Postsurgical changes related to prior CABG.

Left subclavian ICD.

Degenerative changes of the visualized thoracolumbar spine.
Cervical spine fixation hardware, incompletely visualized.
IMPRESSION: No evidence of acute cardiopulmonary disease.

Stable cardiomegaly.

## 2013-07-15 IMAGING — CR DG CHEST 2V
2 series · 2 of 2 positions shown · non-contrast
Comparison: Plain film 08/14/2011

CLINICAL DATA: Chest heart failure

CHEST - 2 VIEW

[w chest pa]
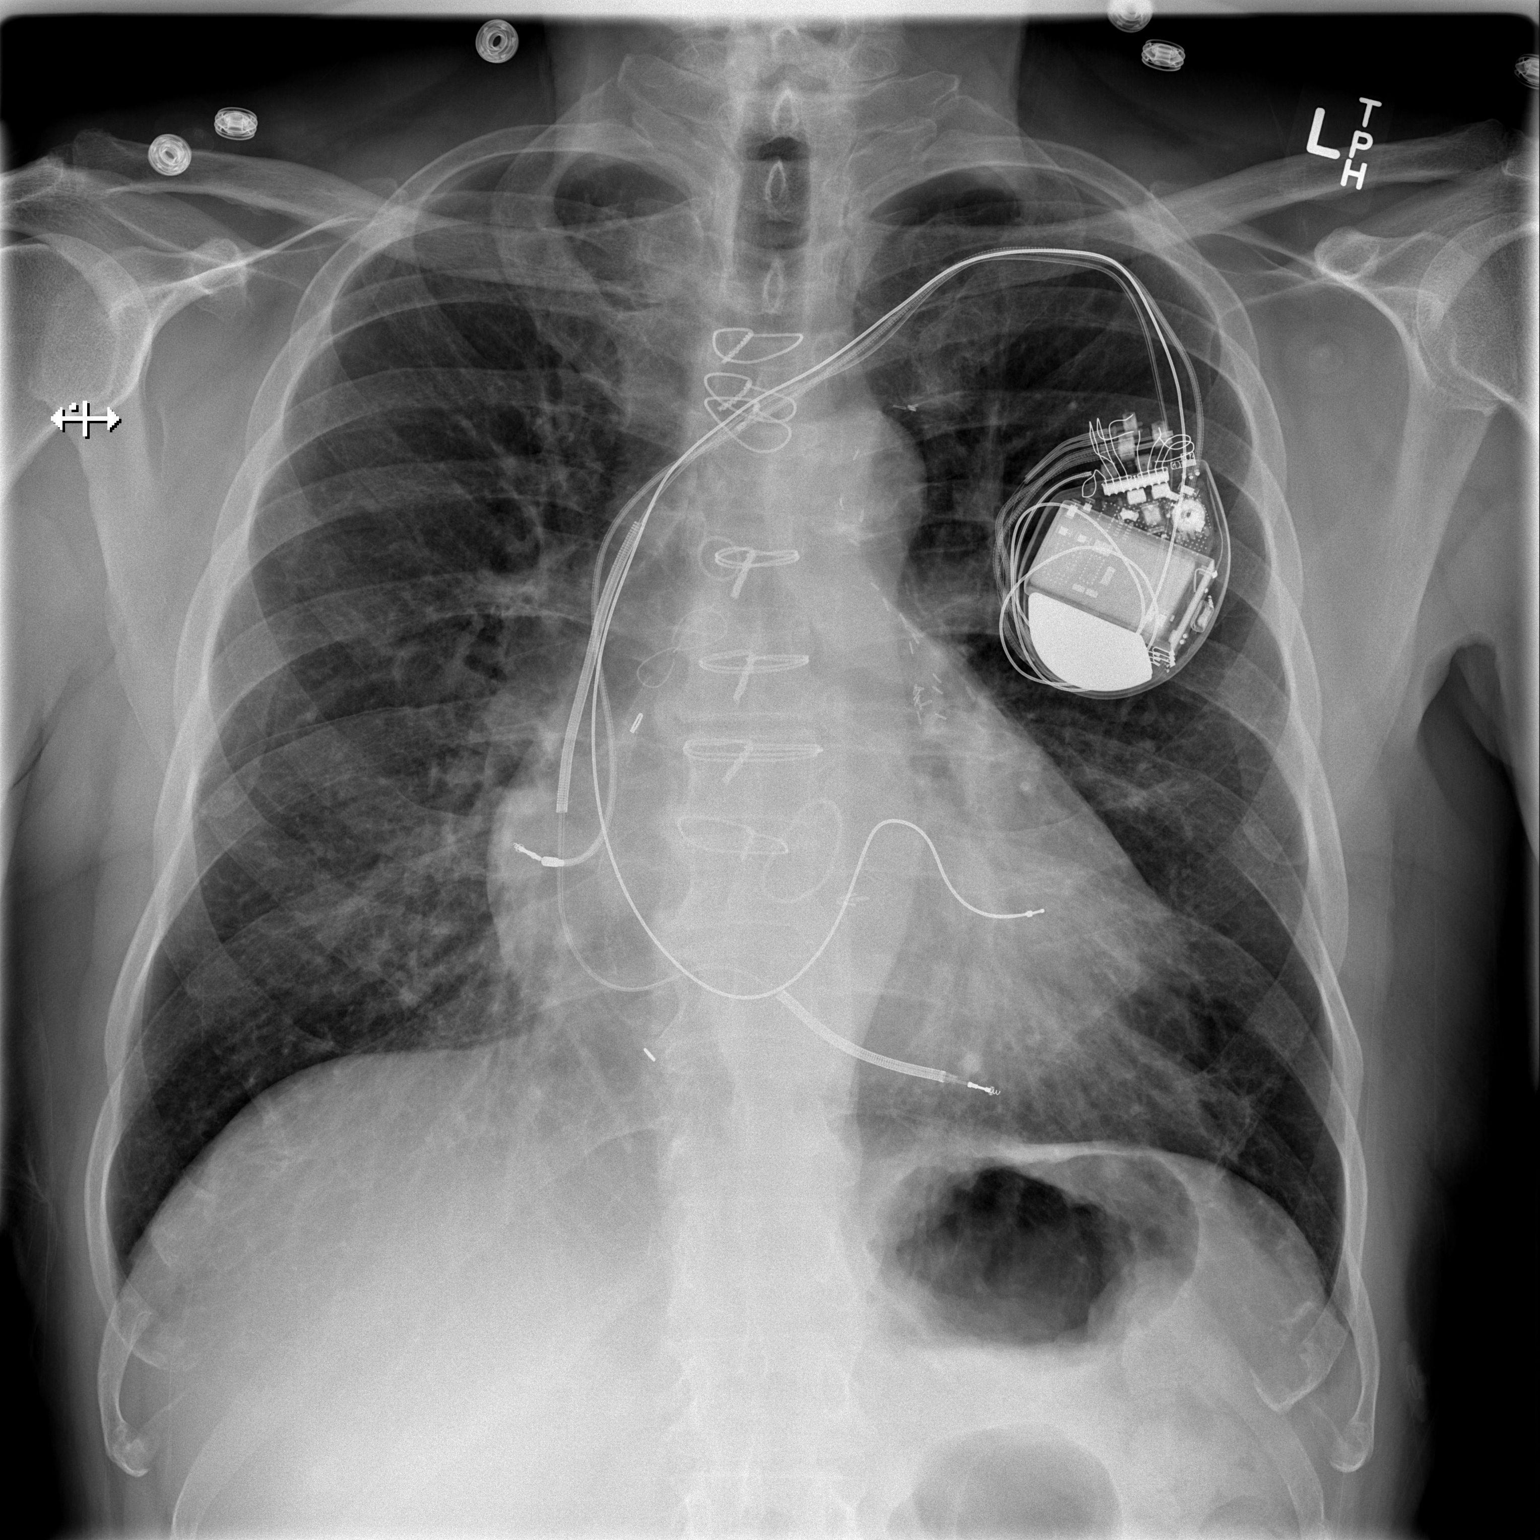

[w chest lat]
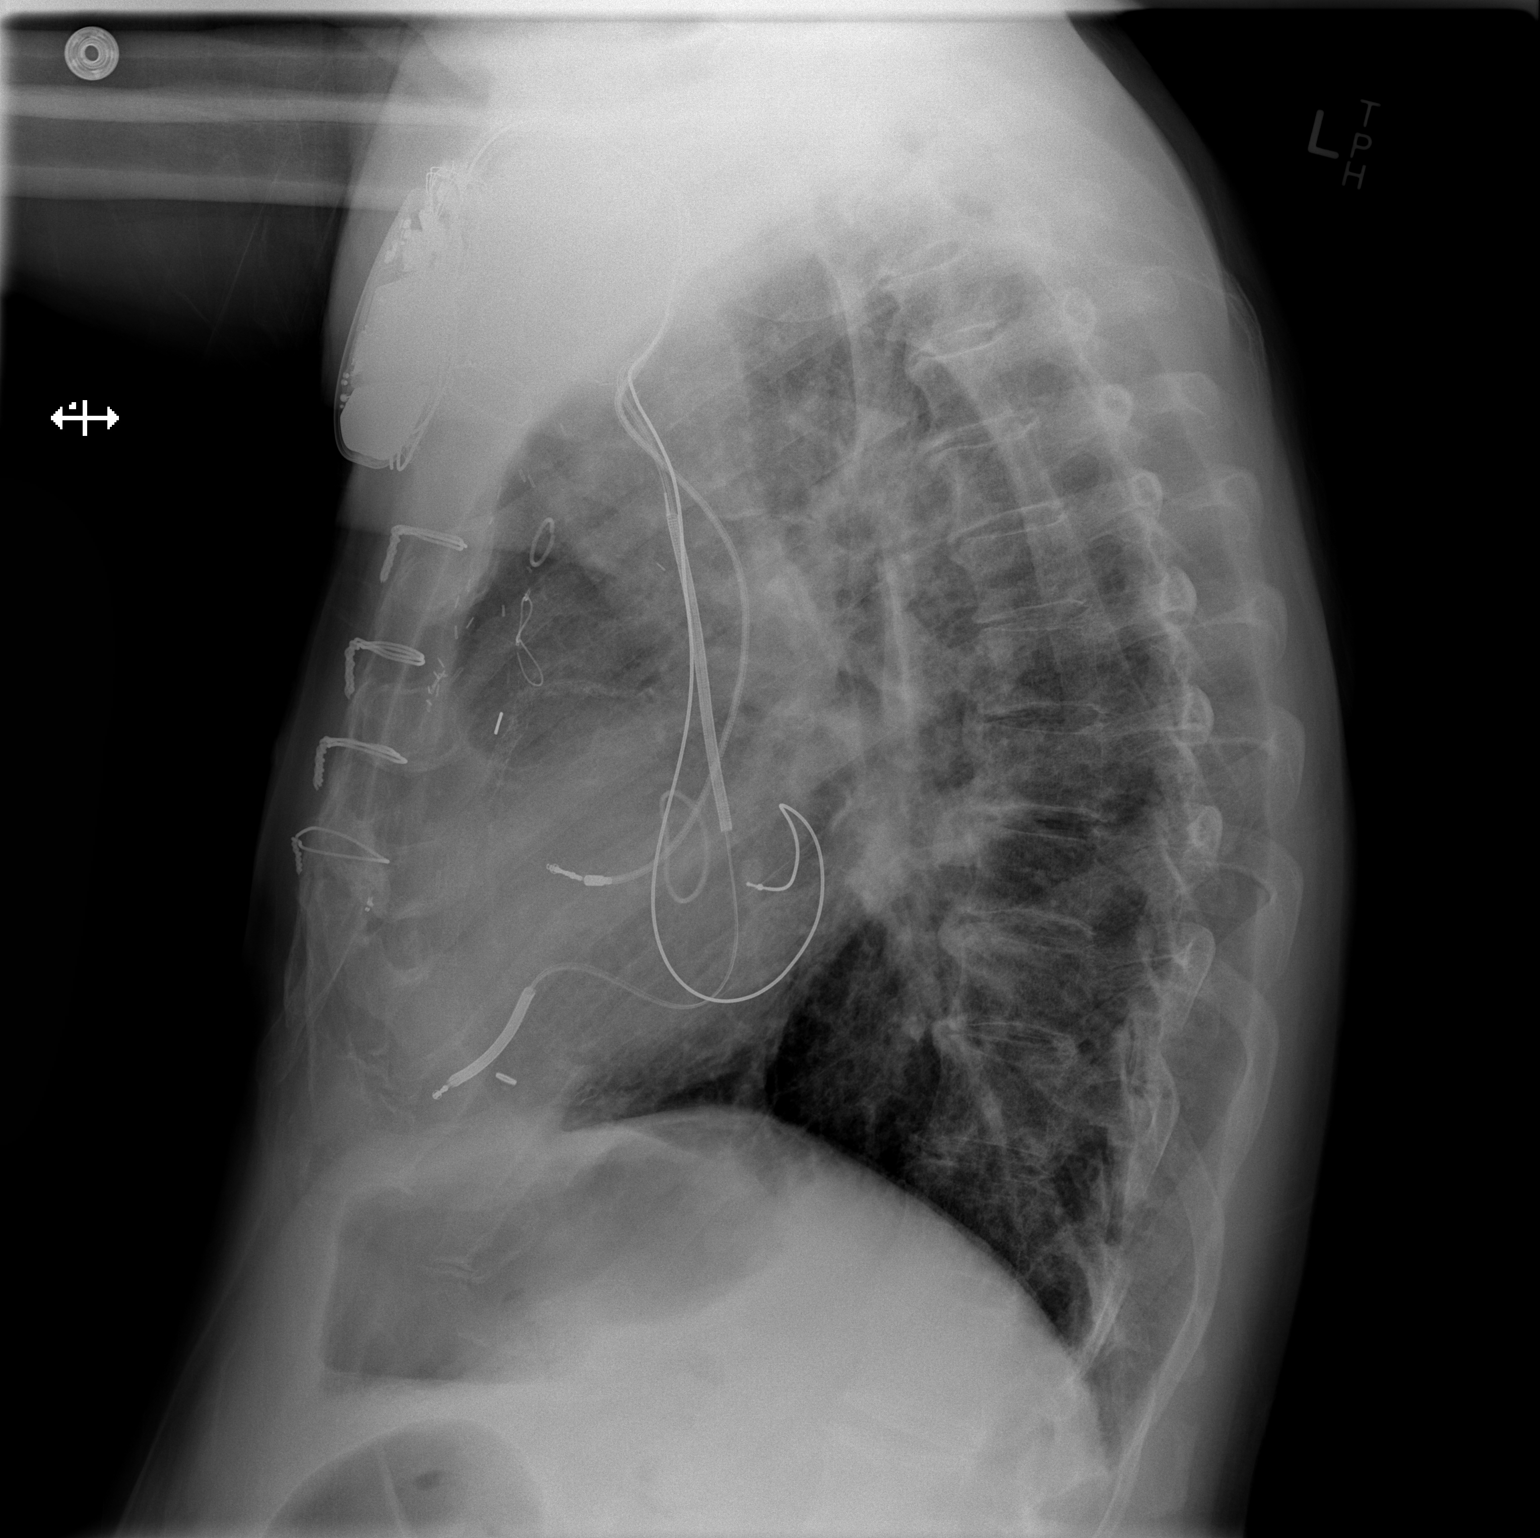

[2 of 2 positions shown; findings below may reference images not displayed]

FINDINGS: Left-sided pacemaker with continuous leads overlies
stable cardiac silhouette.  No effusion, infiltrate, or
pneumothorax. Degenerative osteophytosis of the thoracic spine.
IMPRESSION: Cardiomegaly without acute cardiopulmonary process.

## 2013-07-21 ENCOUNTER — Ambulatory Visit (INDEPENDENT_AMBULATORY_CARE_PROVIDER_SITE_OTHER): Payer: Medicare Other | Admitting: Cardiovascular Disease

## 2013-07-21 ENCOUNTER — Encounter: Payer: Self-pay | Admitting: Cardiovascular Disease

## 2013-07-21 VITALS — BP 102/70 | HR 72 | Ht 69.0 in | Wt 172.5 lb

## 2013-07-21 DIAGNOSIS — E785 Hyperlipidemia, unspecified: Secondary | ICD-10-CM

## 2013-07-21 DIAGNOSIS — I10 Essential (primary) hypertension: Secondary | ICD-10-CM

## 2013-07-21 DIAGNOSIS — I5043 Acute on chronic combined systolic (congestive) and diastolic (congestive) heart failure: Secondary | ICD-10-CM

## 2013-07-21 DIAGNOSIS — R0602 Shortness of breath: Secondary | ICD-10-CM

## 2013-07-21 DIAGNOSIS — I25810 Atherosclerosis of coronary artery bypass graft(s) without angina pectoris: Secondary | ICD-10-CM

## 2013-07-21 DIAGNOSIS — I4891 Unspecified atrial fibrillation: Secondary | ICD-10-CM

## 2013-07-21 DIAGNOSIS — K5900 Constipation, unspecified: Secondary | ICD-10-CM

## 2013-07-21 DIAGNOSIS — I509 Heart failure, unspecified: Secondary | ICD-10-CM

## 2013-07-21 MED ORDER — METOLAZONE 5 MG PO TABS: 5.0000 mg | ORAL_TABLET | Freq: Two times a day (BID) | ORAL | Status: DC | PRN

## 2013-07-21 NOTE — Assessment & Plan Note (Signed)
He does not want statins.

## 2013-07-21 NOTE — Assessment & Plan Note (Signed)
Currently with no symptoms of angina. No further workup at this time. Continue current medication regimen. 

## 2013-07-21 NOTE — Assessment & Plan Note (Signed)
Recommended he stay on milk of magnesia and MiraLAX alternating daily

## 2013-07-21 NOTE — Assessment & Plan Note (Signed)
Chronic shortness of breath likely from systolic dysfunction, deconditioning. He'll continue on metolazone with his Lasix

## 2013-07-21 NOTE — Assessment & Plan Note (Addendum)
We have encouraged him to stay on his current medications. He appears relatively euvolemic on today's visit. he will take metolazone as needed

## 2013-07-21 NOTE — Patient Instructions (Addendum)
Ok to hold the digoxin for a few weeks Monitor your heart rate If your skin is not getting better, Please go back on the digoxin Call our office  If bruising is severe, You could hold the aspirin Continue on plavix  Please call us if you have new issues that need to be addressed before your next appt.  Your physician wants you to follow-up in: 3 months.  You will receive a reminder letter in the mail two months in advance. If you don't receive a letter, please call our office to schedule the follow-up appointment.

## 2013-07-21 NOTE — Progress Notes (Signed)
Patient ID: Corey [name], male    DOB: 1935-09-06, 78 y.o.   MRN: [id number]  HPI Comments: Corey [name] is a 78 year old male  with history of diabetes,  coronary artery disease, bypass surgery 1995, with a redo bypass, mitral valve annuloplasty ring placed,  ischemic cardiomyopathy, non-ST elevation MI in June 2013 with medical management recommended his coronary artery disease, ICD /defibrillator for ventricular tachycardia (notes indicating complete heart block), chronic atrial fibrillation,  who presented to the hospital in August 2013 with rash of the lower extremities and volume overload. He was taking Lasix 40 mg twice a day on presentation.  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  He completed  a course of steroids. He has refused statins in the past He presents for followup today  In followup today, he reports having significant dermatologic issues. Skin is dry, he has lesions and breaks out frequently, itching, lots of bruising. He is seeing dermatology and they feel his symptoms are from one of his medications. He feels it is the digoxin. He reports stopping the digoxin in the past and symptoms improved. In general he does not feel well, has significant constipation issues. He takes MiraLAX and milk of magnesia periodically Is taking diuretics on a regular basis for chronic shortness of breath. Previously was having stomach discomfort on a chronic basis   cardiac catheterization 03/21/2013 for shortness of breath. Stent was placed to 90% blocked vein graft to the OM. LIMA graft to the LAD was patent. He has 80% left main disease, 100% occluded left circumflex, proximal LAD and proximal RCA.  Previous  nosebleeds x2  He has chosen not to try any other anticoagulants given his  leg condition/vasculitis and nosebleeds.  Previous TEE June 2013 showing ejection fraction 20-25%, diffuse hypokinesis,  moderate TR No significant recent edema  EKG today shows paced rhythm with a rate of 72 beats per minute    Outpatient Encounter Prescriptions as of 07/21/2013  Medication Sig  . acetaminophen (TYLENOL) 500 MG tablet Take 500 mg by mouth every 4 (four) hours as needed. For pain  . aspirin 81 MG tablet Take 81 mg by mouth daily.   . clopidogrel (PLAVIX) 75 MG tablet Take 75 mg by mouth once.   . digoxin (LANOXIN) 0.125 MG tablet Take 1 tablet (0.125 mg total) by mouth daily.  . furosemide (LASIX) 40 MG tablet Take 40 mg by mouth 2 (two) times daily.   . isosorbide mononitrate (IMDUR) 60 MG 24 hr tablet Take 60 mg by mouth daily.  Marland Kitchen KLOR-CON M20 20 MEQ tablet TAKE 1 TABLET (20 MEQ TOTAL) BY MOUTH 2 (TWO) TIMES DAILY.  . metolazone (ZAROXOLYN) 5 MG tablet Take 1 tablet (5 mg total) by mouth 2 (two) times daily as needed.  . metoprolol (LOPRESSOR) 25 MG tablet Take 1 tablet (25 mg total) by mouth 2 (two) times daily.  . nitroGLYCERIN (NITROSTAT) 0.4 MG SL tablet Place 0.4 mg under the tongue every 5 (five) minutes x 3 doses as needed. For chest pain  . polyethylene glycol powder (GLYCOLAX/MIRALAX) powder Take 17 g by mouth as needed.  . triamcinolone cream (KENALOG) 0.1 % Apply 1 application topically 2 (two) times daily. Do not use in the same place for more than 7 day.  . triazolam (HALCION) 0.25 MG tablet TAKE 1 TABLET BY MOUTH AT BEDTIME    Review of Systems  Constitutional: Negative.   HENT:  Negative.   Eyes: Negative.   Respiratory: Positive for shortness of breath.   Cardiovascular: Negative.   Gastrointestinal: Negative.   Endocrine: Negative.   Musculoskeletal: Negative.   Skin: Positive for rash and wound.  Allergic/Immunologic: Negative.   Neurological: Negative.   Hematological: Negative.   Psychiatric/Behavioral: Negative.   All other systems reviewed and are negative.   BP 102/70  Pulse 72  Ht 5\' 9"  (1.753 m)  Wt 172 lb 8 oz (78.245 kg)  BMI 25.46  kg/m2  Physical Exam  Nursing note and vitals reviewed. Constitutional: He is oriented to person, place, and time. He appears well-developed and well-nourished.  HENT:  Head: Normocephalic.  Nose: Nose normal.  Mouth/Throat: Oropharynx is clear and moist.  Eyes: Conjunctivae are normal. Pupils are equal, round, and reactive to light.  Neck: Normal range of motion. Neck supple. No JVD present.  Cardiovascular: Normal rate, regular rhythm, S1 normal, S2 normal, normal heart sounds and intact distal pulses.  Exam reveals no gallop and no friction rub.   No murmur heard. Pulmonary/Chest: Effort normal. No respiratory distress. He has decreased breath sounds. He has no wheezes. He has no rales. He exhibits no tenderness.  Abdominal: Soft. Bowel sounds are normal. He exhibits no distension. There is no tenderness.  Musculoskeletal: Normal range of motion. He exhibits no edema and no tenderness.  Lymphadenopathy:    He has no cervical adenopathy.  Neurological: He is alert and oriented to person, place, and time. Coordination normal.  Skin: Skin is warm and dry. No rash noted. No erythema.  Large regions of bruising on his forearms  Psychiatric: He has a normal mood and affect. His behavior is normal. Judgment and thought content normal.      Assessment and Plan

## 2013-07-21 NOTE — Assessment & Plan Note (Addendum)
He does not want anticoagulation, prior issue with warfarin. He is unhappy with aspirin and Plavix. He does have significant bruising. We have suggested if he would like, he could hold the aspirin, stay on the Plavix.  He wants to do a trial hold off his digoxin to see if his skin condition gets better. We have suggested he go back on the digoxin if no relief after several weeks

## 2013-07-31 ENCOUNTER — Ambulatory Visit: Payer: Medicare Other | Admitting: Internal Medicine

## 2013-08-05 ENCOUNTER — Telehealth: Payer: Self-pay | Admitting: *Deleted

## 2013-08-05 ENCOUNTER — Ambulatory Visit: Payer: Medicare Other | Admitting: Internal Medicine

## 2013-08-05 ENCOUNTER — Telehealth: Payer: Self-pay | Admitting: Internal Medicine

## 2013-08-05 NOTE — Telephone Encounter (Signed)
Left message for patient to call back  

## 2013-08-05 NOTE — Telephone Encounter (Signed)
Needs an order to stop Plavix for 3 days for Dr. Vickki Muff [facility] (having eye surgery).

## 2013-08-05 NOTE — Telephone Encounter (Signed)
What kind of eye surgery?

## 2013-08-05 NOTE — Telephone Encounter (Signed)
Spoke w/ pt.  He states that he is having a "black spot removed from the bottom of my eye.  They will give me a valium beforehand." Advised pt to have Dr. Alvera Singh office fax [place] request for cardiac clearance and we will send it back to them.  He is agreeable and will call w/ further questions or concerns.

## 2013-08-07 ENCOUNTER — Encounter: Payer: Self-pay | Admitting: *Deleted

## 2013-08-07 NOTE — Telephone Encounter (Signed)
Patient got lost of the way to the appointment earlier this week and turned against traffic and got nervous and lost so he went home.  He would like an earlier appt that 8/5 for abdominal pain.  He is offered an appt for today at 11:30, but he can't get here in time he states.  He is offered an appt with Alonza Bogus, PA on 08/11/13 9:30.  He will take this appt.

## 2013-08-11 ENCOUNTER — Encounter: Payer: Self-pay | Admitting: Gastroenterology

## 2013-08-11 ENCOUNTER — Ambulatory Visit (INDEPENDENT_AMBULATORY_CARE_PROVIDER_SITE_OTHER): Payer: Medicare Other | Admitting: Gastroenterology

## 2013-08-11 VITALS — BP 100/66 | HR 60 | Ht 69.0 in | Wt 174.2 lb

## 2013-08-11 DIAGNOSIS — K5900 Constipation, unspecified: Secondary | ICD-10-CM

## 2013-08-11 DIAGNOSIS — I25810 Atherosclerosis of coronary artery bypass graft(s) without angina pectoris: Secondary | ICD-10-CM

## 2013-08-11 DIAGNOSIS — R109 Unspecified abdominal pain: Secondary | ICD-10-CM | POA: Insufficient documentation

## 2013-08-11 MED ORDER — COLCHICINE 0.6 MG PO TABS: 0.6000 mg | ORAL_TABLET | Freq: Every day | ORAL | Status: AC

## 2013-08-11 NOTE — Patient Instructions (Signed)
Continue Miralax once daily   We have sent the following medications to your pharmacy for you to pick up at your convenience: Colchicine 0.6 mg, please take one tablet by mouth once daily   You have a follow up office visit with Dr. Carlean Purl on 10-08-2013 at 945 am. If your symptoms become worse or you are not getting better please call back to make an appointment with one of Dr. Carlean Purl extenders.

## 2013-08-11 NOTE — Progress Notes (Signed)
     08/11/2013 Corey [name] [id number] 03/18/1935   History of Present Illness:  This is a 78 year old male with multiple medical problems who is known to Dr. Carlean Purl previously for issues with constipation, etc. He underwent colonoscopy in June 2014 at which time he had a polyp removed from the distal sigmoid colon; it was a tubular adenoma on pathology. He presents to our office today with 2 different complaints. First, he complains of chronic abdominal pain that began prior to his colonoscopy last year, and he states that the pain has been present daily ever since. He reports that as generalized lower abdominal pain and states that her her extensor his rectum. He says that the pain is getting worse. He has not lost any weight and states that he can't eat just fine, but I anytime he drink any liquids, including water, it causes the pain to be worse.  He also reports constipation. Take MiraLax daily and states that that helped a little bit. He has used Dulcolax, excised, and milk of magnesia without any further improvement. Apparently Linzess and Amitza are not on his medication formularies.    He had a CT scan of the abdomen and pelvis without contrast in July 2014 that showed some changes consistent with constipation, but was otherwise unremarkable for pain causing abnormalities.  Dr. Carlean Purl did mention at his last visit with the patient about potentially treating him for neuropathy.   Current Medications, Allergies, Past Medical History, Past Surgical History, Family History and Social History were reviewed in [employer] record.   Physical Exam: BP 100/66  Pulse 60  Ht 5\' 9"  (1.753 m)  Wt 174 lb 3.2 oz (79.017 kg)  BMI 25.71 kg/m2 General: Chronically ill-appearing male in no acute distress Head: Normocephalic and atraumatic Eyes:  Sclerae anicteric, conjunctiva pink  Ears: Normal auditory acuity Lungs: Clear throughout to auscultation Heart: Regular rate  and rhythm Abdomen: Soft, non-distended.  Normal bowel sounds.  Mild diffuse TTP without R/R/G. Rectal:  Sphincter tone and pelvic floor function normal.  No masses noted on DRE.  Brown stool noted on exam glove. Musculoskeletal: Symmetrical with no gross deformities  Extremities: No edema  Neurological: Alert oriented x 4, grossly non-focal Psychological:  Alert and cooperative. Normal mood and affect  Assessment and Recommendations: -Chronic abdominal pain:  ? Source of his pain.  ? If it is related to his cardiac disease and "low-flow" state.  ? If related to his constipation.  ? Neuropathy.   -Constipation:  He will continue his Miralax once daily.  We will also place him on colchicine daily to see if the side effect of diarrhea helps him move his bowels.    *He will follow-up with Dr. Carlean Purl in 4-8 weeks or sooner with an APP if needed. *Dr. Carlean Purl also saw and examined the patient today and was involved in all decision making.

## 2013-08-14 ENCOUNTER — Encounter: Payer: Self-pay | Admitting: *Deleted

## 2013-08-14 NOTE — Progress Notes (Signed)
Agree 

## 2013-08-19 ENCOUNTER — Ambulatory Visit (INDEPENDENT_AMBULATORY_CARE_PROVIDER_SITE_OTHER): Payer: Medicare Other | Admitting: Internal Medicine

## 2013-08-19 ENCOUNTER — Encounter: Payer: Self-pay | Admitting: Internal Medicine

## 2013-08-19 VITALS — BP 102/68 | HR 70 | Temp 98.0°F | Resp 20 | Ht 69.0 in | Wt 172.5 lb

## 2013-08-19 DIAGNOSIS — I2589 Other forms of chronic ischemic heart disease: Secondary | ICD-10-CM

## 2013-08-19 DIAGNOSIS — R109 Unspecified abdominal pain: Secondary | ICD-10-CM

## 2013-08-19 DIAGNOSIS — R05 Cough: Secondary | ICD-10-CM

## 2013-08-19 DIAGNOSIS — N179 Acute kidney failure, unspecified: Secondary | ICD-10-CM

## 2013-08-19 DIAGNOSIS — I25810 Atherosclerosis of coronary artery bypass graft(s) without angina pectoris: Secondary | ICD-10-CM

## 2013-08-19 DIAGNOSIS — R0600 Dyspnea, unspecified: Secondary | ICD-10-CM

## 2013-08-19 DIAGNOSIS — R059 Cough, unspecified: Secondary | ICD-10-CM

## 2013-08-19 DIAGNOSIS — R0989 Other specified symptoms and signs involving the circulatory and respiratory systems: Secondary | ICD-10-CM

## 2013-08-19 DIAGNOSIS — R0609 Other forms of dyspnea: Secondary | ICD-10-CM

## 2013-08-19 DIAGNOSIS — Z79899 Other long term (current) drug therapy: Secondary | ICD-10-CM

## 2013-08-19 DIAGNOSIS — R0689 Other abnormalities of breathing: Secondary | ICD-10-CM

## 2013-08-19 LAB — CBC WITH DIFFERENTIAL/PLATELET
Basophils Absolute: 0 10*3/uL (ref 0.0–0.1)
Basophils Relative: 0.9 % (ref 0.0–3.0)
EOS PCT: 1.3 % (ref 0.0–5.0)
Eosinophils Absolute: 0.1 10*3/uL (ref 0.0–0.7)
HEMATOCRIT: 41.6 % (ref 39.0–52.0)
Hemoglobin: 13.9 g/dL (ref 13.0–17.0)
Lymphocytes Relative: 21.4 % (ref 12.0–46.0)
Lymphs Abs: 0.9 10*3/uL (ref 0.7–4.0)
MCHC: 33.4 g/dL (ref 30.0–36.0)
MCV: 97.6 fl (ref 78.0–100.0)
MONOS PCT: 15 % — AB (ref 3.0–12.0)
Monocytes Absolute: 0.6 10*3/uL (ref 0.1–1.0)
NEUTROS PCT: 61.4 % (ref 43.0–77.0)
Neutro Abs: 2.6 10*3/uL (ref 1.4–7.7)
PLATELETS: 147 10*3/uL — AB (ref 150.0–400.0)
RBC: 4.26 Mil/uL (ref 4.22–5.81)
RDW: 17.8 % — ABNORMAL HIGH (ref 11.5–15.5)
WBC: 4.3 10*3/uL (ref 4.0–10.5)

## 2013-08-19 LAB — BASIC METABOLIC PANEL
BUN: 45 mg/dL — AB (ref 6–23)
CO2: 30 mEq/L (ref 19–32)
CREATININE: 1.7 mg/dL — AB (ref 0.4–1.5)
Calcium: 8.9 mg/dL (ref 8.4–10.5)
Chloride: 93 mEq/L — ABNORMAL LOW (ref 96–112)
GFR: 41.9 mL/min — AB (ref >60.00)
Glucose, Bld: 203 mg/dL — ABNORMAL HIGH (ref 70–99)
POTASSIUM: 2.9 meq/L — AB (ref 3.5–5.1)
Sodium: 134 mEq/L — ABNORMAL LOW (ref 135–145)

## 2013-08-19 LAB — BRAIN NATRIURETIC PEPTIDE: Pro B Natriuretic peptide (BNP): 433 pg/mL — ABNORMAL HIGH (ref 0.0–100.0)

## 2013-08-19 LAB — TROPONIN I: Troponin I: 0.04 ng/mL (ref <0.06)

## 2013-08-19 LAB — CARDIAC PANEL
CK-MB: 1.6 ng/mL (ref 0.3–4.0)
Relative Index: 2.1 calc (ref 0.0–2.5)
Total CK: 75 U/L (ref 7–232)

## 2013-08-19 MED ORDER — BENZONATATE 200 MG PO CAPS: 200.0000 mg | ORAL_CAPSULE | Freq: Three times a day (TID) | ORAL | Status: DC | PRN

## 2013-08-19 NOTE — Patient Instructions (Addendum)
Your problem is not low oxygen.    We cannot get you qualified for oxygen because your oxygen level gets better with walking!!  You need to have your stamina and strength regained ., I am referring you for cardiopulmonary rehab program through [facility]   Please increase your furosemide to 80 mg twice daily for 3 days  Please  take a metolazone twice daily for 3 days as well.  I will prescribe a cough medicine fo ryou to take 2 or 3 times daily

## 2013-08-19 NOTE — Assessment & Plan Note (Addendum)
He is dyspneic with minimal exertion bur not hypoxic enough to qualify for supplemental 02 to be reimbursible unless he qualifies due to severe cardiomyopathy.  He refuses sleep study Will increase his lasxi to 80 mg twice daily,  Add metolazone twice daily for 3 days pending review of chest x ray .

## 2013-08-19 NOTE — Progress Notes (Signed)
Patient ID: Corey [name], male   DOB: 18-Sep-1935, 78 y.o.   MRN: [id number]  Patient Active Problem List   Diagnosis Date Noted  . Dyspnea and respiratory abnormality 08/20/2013  . Acute renal failure 08/20/2013  . Abdominal pain, unspecified site 08/11/2013  . Abdominal pain 06/21/2013  . Depression 06/21/2013  . Rash 05/06/2013  . Acute on chronic combined systolic and diastolic CHF, NYHA class 3 04/14/2013  . Personal history of colonic adenoma 08/29/2012  . Shortness of breath 08/15/2012  . Cough 07/03/2012  . ICD (implantable cardioverter-defibrillator) in place 12/20/2011  . GERD (gastroesophageal reflux disease) 09/30/2011  . Constipation 09/29/2011  . Nausea 09/26/2011  . CAD (coronary artery disease) 08/14/2011  . Hypertension 08/14/2011  . Chronic systolic heart failure 09/38/1829  . CAD, ARTERY BYPASS GRAFT 07/22/2008  . DIABETES MELLITUS 07/11/2008  . HYPERLIPIDEMIA-MIXED 07/11/2008  . CARDIOMYOPATHY, ISCHEMIC 07/11/2008  . Atrial fibrillation 07/11/2008  . RENAL INSUFFICIENCY 07/11/2008    Subjective:  CC:   Chief Complaint  Patient presents with  . Abdominal Pain    Mid Hypogastric area.  . Cough    X 2.5 weeks non-productive dry cough.  . Shortness of Breath    especially lying down   . Leg Swelling    bilateral X 2 weeks right leg edema pitting -2 indention    HPI:   Corey [name] is a 78 y.o. male who presents for 3 to 4 week history of "going downhill."  "I need somebody to get me some oxygen."  He has been Short of breath , now orthopneic and using 2 pillows. sleeping in his recliner.  He reports no significant weight change. Dry cough for the past 2/.5 weeks.  He weighs himself daily as directed.  Takes lasix twice daily and doubled his dose  for one day last week to 4 daily  Along with a dose of metolazone for sudden onset of lower extremity edema, which transiently improved his swelling but not his dyspnea.  His lower extremity edema recurred this  morning .     Has been taking cough syrup and ricola cough drops with no change.  Denies chest pain, nausea or jaw pain        Past Medical History  Diagnosis Date  . 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 CAD, ARTERY BYPASS GRAFT     4v CABG w/ redo '06; cath 08/14/11 high grade lesion in OM graft, total occlusion of SVG to RCA with collaterals,  patent  LIMA to LAD,occlusion of SVG to Diagonal, and severe segmental LV systolic dysfunction, EF 93%   . CARDIOMYOPATHY, ISCHEMIC     EF 20-25%  . Chronic systolic heart failure   . HYPERLIPIDEMIA-MIXED   . CKD (chronic kidney disease), stage III   . Mitral regurgitation      s/p MV repair '06  . HTN (hypertension)   . COPD (chronic obstructive pulmonary disease)   . Complication of anesthesia     "liked to went crazy w/the mess they gave me; started w/an  A"  . Myocardial infarction 1987; ~ 08/17/11  . Anginal pain   . ICD (implantable cardiac defibrillator) in place 08/24/11  . Type II diabetes mellitus   . Arthritis     "in the neck after neck OR"  . GERD (gastroesophageal reflux disease)   . Anemia   . History of warfarin toxicity     skin necrosis  . Colon  polyp 08/29/2012    TUBULAR ADENOMA (X1    Past Surgical History  Procedure Laterality Date  . Mitral valve repair      with an angioplasty ring  . Cervical discectomy      C3-C4  . Cardiac defibrillator placement  ~ 2007    [employer]  . Coronary artery bypass graft  1995    CABG X 2  . Coronary artery bypass graft  2002    CABG "& fixed hole in back of the heart"  . Inguinal hernia repair Bilateral   . Excisional hemorrhoidectomy    . Anterior fusion cervical spine      "went down in my throat to do it"  . Tee without cardioversion  08/30/2011    Procedure: TRANSESOPHAGEAL ECHOCARDIOGRAM (TEE);  Surgeon: Jolaine Artist, MD;  Location: [facility] ENDOSCOPY;  Service: Cardiovascular;  Laterality: N/A;  . Cardioversion   08/30/2011    Procedure: CARDIOVERSION;  Surgeon: Jolaine Artist, MD;  Location: [facility] ENDOSCOPY;  Service: Cardiovascular;  Laterality: N/A;  . Mass biopsy  2013    nasal eposcopic  . Colonoscopy N/A 08/29/2012    Procedure: COLONOSCOPY;  Surgeon: Gatha Mayer, MD;  Location: [facility] ENDOSCOPY;  Service: Endoscopy;  Laterality: N/A;  . Coronary angioplasty with stent placement  03/21/2013    drug eluting stent of the 90% stenosis in SVG from the aorta to OM1.        The following portions of the patient's history were reviewed and updated as appropriate: Allergies, current medications, and problem list.    Review of Systems:   Patient denies headache, fevers, malaise, unintentional weight loss, skin rash, eye pain, sinus congestion and sinus pain, sore throat, dysphagia,  hemoptysis , cough, dyspnea, wheezing, chest pain, palpitations, orthopnea, edema, abdominal pain, nausea, melena, diarrhea, constipation, flank pain, dysuria, hematuria, urinary  Frequency, nocturia, numbness, tingling, seizures,  Focal weakness, Loss of consciousness,  Tremor, insomnia, depression, anxiety, and suicidal ideation.     History   Social History  . Marital Status: Widowed    Spouse Name: N/A    Number of Children: 2  . Years of Education: N/A   Occupational History  .     Social History Main Topics  . Smoking status: Former Smoker -- 1.50 packs/day for 40 years    Types: Cigarettes, Pipe    Quit date: 03/06/1985  . Smokeless tobacco: Former Systems developer    Types: Snuff, Sarina Ser    Quit date: 03/06/1958     Comment: "quit chewing and dipping in my early 20's"  . Alcohol Use: No     Comment: 08/24/11 "last alcohol 15 years ago or better"  . Drug Use: No  . Sexual Activity: No   Other Topics Concern  . Not on file   Social History Narrative   The patient is single, says he has 2 sons.   He is retired.    Objective:  Filed Vitals:   08/19/13 1431  BP: 102/68  Pulse: 70  Temp: 98 F (36.7 C)   Resp: 20     General appearance: alert, cooperative and appears stated age Ears: normal TM's and external ear canals both ears Throat: lips, mucosa, and tongue normal; teeth and gums normal Neck: no adenopathy, no carotid bruit, supple, symmetrical, trachea midline and thyroid not enlarged, symmetric, no tenderness/mass/nodules Back: symmetric, no curvature. ROM normal. No CVA tenderness. Lungs: clear to auscultation bilaterally Heart: regular rate and rhythm, S1, S2 normal, no murmur, click, rub or gallop  Abdomen: soft, non-tender; bowel sounds normal; no masses,  no organomegaly Pulses: 2+ and symmetric Skin: Skin color, texture, turgor normal. No rashes or lesions Lymph nodes: Cervical, supraclavicular, and axillary nodes normal.  Assessment and Plan:  CARDIOMYOPATHY, ISCHEMIC He is dyspneic with minimal exertion bur not hypoxic enough to qualify for supplemental 02 to be reimbursible unless he qualifies due to severe cardiomyopathy.  He refuses sleep study Will increase his lasxi to 80 mg twice daily,  Add metolazone twice daily for 3 days pending review of chest x ray .    Dyspnea and respiratory abnormality His lung exam is actually clear, and his BNP is < 500.  His room air saturations are 93% and actually improve to 98% with ambulation despite him feeling fatigued.  I have ordered a chest x ray  And suggested that he increase his lasix to 80 mg twice daily prior to receiving the results of his labs.  Will have him suspend his lasix for a day, and resume diuresis with spironolactone given his low EF of 20-25% and hypokalemia. I have recommended that he start the pulmonary rehab program at [facility], which he is resistant to at this point because he feels he jsut needs oxygen.   Cough Persistent.  Not hypoxic.,  Will add cough suppressant     Acute renal failure Likely secondary to overdiuresis.  Will suspend lasix for a day and resume diuretics with spironolactone    Updated  Medication List Outpatient Encounter Prescriptions as of 08/19/2013  Medication Sig  . acetaminophen (TYLENOL) 500 MG tablet Take 500 mg by mouth every 4 (four) hours as needed. For pain  . clopidogrel (PLAVIX) 75 MG tablet Take 75 mg by mouth once.   . colchicine (COLCRYS) 0.6 MG tablet Take 1 tablet (0.6 mg total) by mouth daily.  . furosemide (LASIX) 40 MG tablet Take 40 mg by mouth 2 (two) times daily.   Marland Kitchen glimepiride (AMARYL) 4 MG tablet Take one tablet by mouth twice a day.  . isosorbide mononitrate (IMDUR) 60 MG 24 hr tablet Take 60 mg by mouth daily.  Marland Kitchen KLOR-CON M20 20 MEQ tablet TAKE 1 TABLET (20 MEQ TOTAL) BY MOUTH 2 (TWO) TIMES DAILY.  . metolazone (ZAROXOLYN) 5 MG tablet Take 1 tablet (5 mg total) by mouth 2 (two) times daily as needed.  . metoprolol (LOPRESSOR) 25 MG tablet Take 1 tablet (25 mg total) by mouth 2 (two) times daily.  . nitroGLYCERIN (NITROSTAT) 0.4 MG SL tablet Place 0.4 mg under the tongue every 5 (five) minutes x 3 doses as needed. For chest pain  . polyethylene glycol powder (GLYCOLAX/MIRALAX) powder Take 17 g by mouth daily.   . triazolam (HALCION) 0.25 MG tablet TAKE 1 TABLET BY MOUTH AT BEDTIME  . benzonatate (TESSALON) 200 MG capsule Take 1 capsule (200 mg total) by mouth 3 (three) times daily as needed for cough.     Orders Placed This Encounter  Procedures  . DG Chest 2 View  . B Nat Peptide  . Cardiac panel  . Troponin I  . Basic metabolic panel  . CBC with Differential  . Ambulatory referral to Physical Therapy    Return in about 3 days (around 08/22/2013).

## 2013-08-19 NOTE — Progress Notes (Signed)
Pre-visit discussion using our clinic review tool. No additional management support is needed unless otherwise documented below in the visit note.  

## 2013-08-20 ENCOUNTER — Telehealth: Payer: Self-pay | Admitting: *Deleted

## 2013-08-20 ENCOUNTER — Ambulatory Visit: Payer: Self-pay | Admitting: Internal Medicine

## 2013-08-20 ENCOUNTER — Encounter: Payer: Self-pay | Admitting: Internal Medicine

## 2013-08-20 DIAGNOSIS — R0689 Other abnormalities of breathing: Secondary | ICD-10-CM

## 2013-08-20 DIAGNOSIS — N179 Acute kidney failure, unspecified: Secondary | ICD-10-CM | POA: Insufficient documentation

## 2013-08-20 DIAGNOSIS — R0600 Dyspnea, unspecified: Secondary | ICD-10-CM | POA: Insufficient documentation

## 2013-08-20 NOTE — Telephone Encounter (Signed)
CXR Impression: No acute cardiopulmonary abnormality seen. Pt notified of results & states that his cough is still the same. Please advise

## 2013-08-20 NOTE — Addendum Note (Signed)
Addended by: Crecencio Mc on: 08/20/2013 09:52 PM   Modules accepted: Orders

## 2013-08-20 NOTE — Assessment & Plan Note (Addendum)
His lung exam is actually clear, and his BNP is < 500.  His room air saturations are 93% and actually improve to 98% with ambulation despite him feeling fatigued.  I have ordered a chest x ray  And suggested that he increase his lasix to 80 mg twice daily prior to receiving the results of his labs.  Will have him suspend his lasix for a day, and resume diuresis with spironolactone given his low EF of 20-25% and hypokalemia. I have recommended that he start the pulmonary rehab program at [facility], which he is resistant to at this point because he feels he jsut needs oxygen.

## 2013-08-20 NOTE — Assessment & Plan Note (Signed)
Persistent.  Not hypoxic.,  Will add cough suppressant

## 2013-08-20 NOTE — Assessment & Plan Note (Signed)
Likely secondary to overdiuresis.  Will suspend lasix for a day and resume diuretics with spironolactone

## 2013-08-21 NOTE — Telephone Encounter (Signed)
In reviewing, he saw Dr Derrel Nip this week.  Was having some increased cough and sob.  She had initially adjusted his lasix.  After review of his labs, she adjusted his medications and recommended a f/u appt this week.  If he is more sob, he needs to f/u with cardiology asap.

## 2013-08-21 NOTE — Telephone Encounter (Signed)
Pt notified and verbalized understanding. States cough and sob are the same, unchanged. Has follow up appointment with Raquel tomorrow. Advised to be seen in ED if worsening symptoms.

## 2013-08-22 ENCOUNTER — Inpatient Hospital Stay: Payer: Self-pay | Admitting: Internal Medicine

## 2013-08-22 ENCOUNTER — Telehealth: Payer: Self-pay | Admitting: *Deleted

## 2013-08-22 ENCOUNTER — Ambulatory Visit: Payer: Medicare Other | Admitting: Adult Health

## 2013-08-22 DIAGNOSIS — I4891 Unspecified atrial fibrillation: Secondary | ICD-10-CM

## 2013-08-22 DIAGNOSIS — N183 Chronic kidney disease, stage 3 unspecified: Secondary | ICD-10-CM

## 2013-08-22 DIAGNOSIS — N179 Acute kidney failure, unspecified: Secondary | ICD-10-CM

## 2013-08-22 DIAGNOSIS — I5022 Chronic systolic (congestive) heart failure: Secondary | ICD-10-CM

## 2013-08-22 LAB — URINALYSIS, COMPLETE
BILIRUBIN, UR: NEGATIVE
Bacteria: NONE SEEN
Glucose,UR: NEGATIVE mg/dL (ref 0–75)
Ketone: NEGATIVE
Leukocyte Esterase: NEGATIVE
NITRITE: NEGATIVE
Ph: 5 (ref 4.5–8.0)
Protein: >=500
RBC,UR: 6 /HPF (ref 0–5)
Specific Gravity: 1.015 (ref 1.003–1.030)
Squamous Epithelial: 1
WBC UR: NONE SEEN /HPF (ref 0–5)

## 2013-08-22 LAB — CBC
HCT: 48.3 % (ref 40.0–52.0)
HGB: 15.9 g/dL (ref 13.0–18.0)
MCH: 32.9 pg (ref 26.0–34.0)
MCHC: 32.9 g/dL (ref 32.0–36.0)
MCV: 100 fL (ref 80–100)
PLATELETS: 139 10*3/uL — AB (ref 150–440)
RBC: 4.84 10*6/uL (ref 4.40–5.90)
RDW: 17.7 % — ABNORMAL HIGH (ref 11.5–14.5)
WBC: 7.3 10*3/uL (ref 3.8–10.6)

## 2013-08-22 LAB — TROPONIN I
TROPONIN-I: 0.21 ng/mL — AB
TROPONIN-I: 0.66 ng/mL — AB

## 2013-08-22 LAB — COMPREHENSIVE METABOLIC PANEL
ALBUMIN: 3.6 g/dL (ref 3.4–5.0)
ALT: 33 U/L (ref 12–78)
ANION GAP: 15 (ref 7–16)
Alkaline Phosphatase: 44 U/L — ABNORMAL LOW
BILIRUBIN TOTAL: 2.9 mg/dL — AB (ref 0.2–1.0)
BUN: 56 mg/dL — ABNORMAL HIGH (ref 7–18)
CALCIUM: 8.8 mg/dL (ref 8.5–10.1)
CREATININE: 2.53 mg/dL — AB (ref 0.60–1.30)
Chloride: 92 mmol/L — ABNORMAL LOW (ref 98–107)
Co2: 25 mmol/L (ref 21–32)
GFR CALC AF AMER: 28 — AB
GFR CALC NON AF AMER: 24 — AB
Glucose: 270 mg/dL — ABNORMAL HIGH (ref 65–99)
Osmolality: 290 (ref 275–301)
Potassium: 3.1 mmol/L — ABNORMAL LOW (ref 3.5–5.1)
SGOT(AST): 56 U/L — ABNORMAL HIGH (ref 15–37)
Sodium: 132 mmol/L — ABNORMAL LOW (ref 136–145)
TOTAL PROTEIN: 8.1 g/dL (ref 6.4–8.2)

## 2013-08-22 LAB — CK-MB
CK-MB: 1.4 ng/mL (ref 0.5–3.6)
CK-MB: 2 ng/mL (ref 0.5–3.6)
CK-MB: 2.8 ng/mL (ref 0.5–3.6)

## 2013-08-22 LAB — PRO B NATRIURETIC PEPTIDE: B-Type Natriuretic Peptide: 15603 pg/mL — ABNORMAL HIGH (ref 0–450)

## 2013-08-22 NOTE — Telephone Encounter (Signed)
Pt presented to office unsteady on his feet, c/o shortness of breath and mid abdominal pain. BP 110/70. HR 90-140. O2 87% on RA. Placed pt on O2 2L, increased to 89-93%. Increased to 3L pt up to 95%. Had to get wheelchair to get pt inside door, very unsteady, slow in answering questions. Called for ambulance to transport to ED. Dr. Gilford Rile here and notified. Pt denied fever, N/V.

## 2013-08-23 DIAGNOSIS — R109 Unspecified abdominal pain: Secondary | ICD-10-CM

## 2013-08-23 DIAGNOSIS — I059 Rheumatic mitral valve disease, unspecified: Secondary | ICD-10-CM

## 2013-08-23 LAB — BASIC METABOLIC PANEL
ANION GAP: 8 (ref 7–16)
BUN: 64 mg/dL — ABNORMAL HIGH (ref 7–18)
Calcium, Total: 8.3 mg/dL — ABNORMAL LOW (ref 8.5–10.1)
Chloride: 96 mmol/L — ABNORMAL LOW (ref 98–107)
Co2: 31 mmol/L (ref 21–32)
Creatinine: 2.06 mg/dL — ABNORMAL HIGH (ref 0.60–1.30)
EGFR (African American): 35 — ABNORMAL LOW
EGFR (Non-African Amer.): 31 — ABNORMAL LOW
GLUCOSE: 95 mg/dL (ref 65–99)
Osmolality: 288 (ref 275–301)
Potassium: 3.2 mmol/L — ABNORMAL LOW (ref 3.5–5.1)
Sodium: 135 mmol/L — ABNORMAL LOW (ref 136–145)

## 2013-08-23 LAB — AMYLASE: AMYLASE: 58 U/L (ref 25–115)

## 2013-08-23 LAB — CBC WITH DIFFERENTIAL/PLATELET
BASOS ABS: 0 10*3/uL (ref 0.0–0.1)
Basophil %: 0.4 %
Eosinophil #: 0 10*3/uL (ref 0.0–0.7)
Eosinophil %: 0.1 %
HCT: 44.9 % (ref 40.0–52.0)
HGB: 14.4 g/dL (ref 13.0–18.0)
LYMPHS PCT: 10.3 %
Lymphocyte #: 0.7 10*3/uL — ABNORMAL LOW (ref 1.0–3.6)
MCH: 31.8 pg (ref 26.0–34.0)
MCHC: 32 g/dL (ref 32.0–36.0)
MCV: 99 fL (ref 80–100)
MONOS PCT: 7.2 %
Monocyte #: 0.5 x10 3/mm (ref 0.2–1.0)
Neutrophil #: 6 10*3/uL (ref 1.4–6.5)
Neutrophil %: 82 %
Platelet: 107 10*3/uL — ABNORMAL LOW (ref 150–440)
RBC: 4.52 10*6/uL (ref 4.40–5.90)
RDW: 17.4 % — AB (ref 11.5–14.5)
WBC: 7.3 10*3/uL (ref 3.8–10.6)

## 2013-08-23 LAB — LIPASE, BLOOD: Lipase: 140 U/L (ref 73–393)

## 2013-08-24 LAB — BASIC METABOLIC PANEL
Anion Gap: 11 (ref 7–16)
BUN: 72 mg/dL — ABNORMAL HIGH (ref 7–18)
CO2: 29 mmol/L (ref 21–32)
Calcium, Total: 8 mg/dL — ABNORMAL LOW (ref 8.5–10.1)
Chloride: 93 mmol/L — ABNORMAL LOW (ref 98–107)
Creatinine: 2.09 mg/dL — ABNORMAL HIGH (ref 0.60–1.30)
GFR CALC AF AMER: 35 — AB
GFR CALC NON AF AMER: 30 — AB
GLUCOSE: 75 mg/dL (ref 65–99)
Osmolality: 286 (ref 275–301)
Potassium: 3.3 mmol/L — ABNORMAL LOW (ref 3.5–5.1)
SODIUM: 133 mmol/L — AB (ref 136–145)

## 2013-08-25 ENCOUNTER — Encounter: Payer: Self-pay | Admitting: Cardiovascular Disease

## 2013-08-25 DIAGNOSIS — I5023 Acute on chronic systolic (congestive) heart failure: Secondary | ICD-10-CM

## 2013-08-25 LAB — BASIC METABOLIC PANEL
Anion Gap: 9 (ref 7–16)
BUN: 78 mg/dL — AB (ref 7–18)
Calcium, Total: 8.6 mg/dL (ref 8.5–10.1)
Chloride: 93 mmol/L — ABNORMAL LOW (ref 98–107)
Co2: 32 mmol/L (ref 21–32)
Creatinine: 1.93 mg/dL — ABNORMAL HIGH (ref 0.60–1.30)
GFR CALC AF AMER: 38 — AB
GFR CALC NON AF AMER: 33 — AB
Glucose: 99 mg/dL (ref 65–99)
Osmolality: 292 (ref 275–301)
Potassium: 3 mmol/L — ABNORMAL LOW (ref 3.5–5.1)
Sodium: 134 mmol/L — ABNORMAL LOW (ref 136–145)

## 2013-08-25 LAB — PLATELET COUNT: Platelet: 80 10*3/uL — ABNORMAL LOW (ref 150–440)

## 2013-08-25 LAB — MAGNESIUM: MAGNESIUM: 2.6 mg/dL — AB

## 2013-08-25 LAB — PHOSPHORUS: Phosphorus: 3.2 mg/dL (ref 2.5–4.9)

## 2013-08-26 LAB — BASIC METABOLIC PANEL
Anion Gap: 6 — ABNORMAL LOW (ref 7–16)
BUN: 71 mg/dL — AB (ref 7–18)
CO2: 37 mmol/L — AB (ref 21–32)
Calcium, Total: 8.9 mg/dL (ref 8.5–10.1)
Chloride: 92 mmol/L — ABNORMAL LOW (ref 98–107)
Creatinine: 1.56 mg/dL — ABNORMAL HIGH (ref 0.60–1.30)
EGFR (African American): 50 — ABNORMAL LOW
GFR CALC NON AF AMER: 43 — AB
Glucose: 86 mg/dL (ref 65–99)
Osmolality: 290 (ref 275–301)
POTASSIUM: 2.9 mmol/L — AB (ref 3.5–5.1)
SODIUM: 135 mmol/L — AB (ref 136–145)

## 2013-08-26 LAB — POTASSIUM: Potassium: 4 mmol/L (ref 3.5–5.1)

## 2013-08-26 LAB — PHOSPHORUS: PHOSPHORUS: 1.9 mg/dL — AB (ref 2.5–4.9)

## 2013-08-26 LAB — MAGNESIUM: MAGNESIUM: 2.3 mg/dL

## 2013-08-27 LAB — BASIC METABOLIC PANEL
Anion Gap: 4 — ABNORMAL LOW (ref 7–16)
BUN: 49 mg/dL — ABNORMAL HIGH (ref 7–18)
CALCIUM: 8.7 mg/dL (ref 8.5–10.1)
CO2: 36 mmol/L — AB (ref 21–32)
Chloride: 93 mmol/L — ABNORMAL LOW (ref 98–107)
Creatinine: 1.66 mg/dL — ABNORMAL HIGH (ref 0.60–1.30)
EGFR (African American): 46 — ABNORMAL LOW
GFR CALC NON AF AMER: 40 — AB
GLUCOSE: 102 mg/dL — AB (ref 65–99)
Osmolality: 280 (ref 275–301)
Potassium: 4 mmol/L (ref 3.5–5.1)
SODIUM: 133 mmol/L — AB (ref 136–145)

## 2013-08-27 LAB — CULTURE, BLOOD (SINGLE)

## 2013-08-28 DIAGNOSIS — I4891 Unspecified atrial fibrillation: Secondary | ICD-10-CM

## 2013-08-28 DIAGNOSIS — N19 Unspecified kidney failure: Secondary | ICD-10-CM

## 2013-08-28 DIAGNOSIS — N183 Chronic kidney disease, stage 3 unspecified: Secondary | ICD-10-CM

## 2013-08-28 DIAGNOSIS — N179 Acute kidney failure, unspecified: Secondary | ICD-10-CM

## 2013-08-28 DIAGNOSIS — I5023 Acute on chronic systolic (congestive) heart failure: Secondary | ICD-10-CM

## 2013-08-28 LAB — BASIC METABOLIC PANEL
ANION GAP: 6 — AB (ref 7–16)
Anion Gap: 4 — ABNORMAL LOW (ref 7–16)
BUN: 68 mg/dL — ABNORMAL HIGH (ref 7–18)
BUN: 74 mg/dL — ABNORMAL HIGH (ref 7–18)
CALCIUM: 9.3 mg/dL (ref 8.5–10.1)
CO2: 32 mmol/L (ref 21–32)
CREATININE: 2.56 mg/dL — AB (ref 0.60–1.30)
Calcium, Total: 9 mg/dL (ref 8.5–10.1)
Chloride: 93 mmol/L — ABNORMAL LOW (ref 98–107)
Chloride: 94 mmol/L — ABNORMAL LOW (ref 98–107)
Co2: 32 mmol/L (ref 21–32)
Creatinine: 2.97 mg/dL — ABNORMAL HIGH (ref 0.60–1.30)
EGFR (African American): 27 — ABNORMAL LOW
EGFR (African American): 23 — ABNORMAL LOW
EGFR (Non-African Amer.): 24 — ABNORMAL LOW
EGFR (Non-African Amer.): 20 — ABNORMAL LOW
Glucose: 119 mg/dL — ABNORMAL HIGH (ref 65–99)
Glucose: 178 mg/dL — ABNORMAL HIGH (ref 65–99)
Osmolality: 284 (ref 275–301)
Osmolality: 287 (ref 275–301)
Potassium: 6.9 mmol/L (ref 3.5–5.1)
Potassium: 6.4 mmol/L — ABNORMAL HIGH (ref 3.5–5.1)
Sodium: 131 mmol/L — ABNORMAL LOW (ref 136–145)
Sodium: 130 mmol/L — ABNORMAL LOW (ref 136–145)

## 2013-08-28 LAB — PHOSPHORUS: Phosphorus: 4.6 mg/dL (ref 2.5–4.9)

## 2013-08-29 LAB — BASIC METABOLIC PANEL
Anion Gap: 8 (ref 7–16)
BUN: 70 mg/dL — ABNORMAL HIGH (ref 7–18)
CALCIUM: 8.4 mg/dL — AB (ref 8.5–10.1)
CHLORIDE: 97 mmol/L — AB (ref 98–107)
Co2: 30 mmol/L (ref 21–32)
Creatinine: 2.39 mg/dL — ABNORMAL HIGH (ref 0.60–1.30)
EGFR (Non-African Amer.): 26 — ABNORMAL LOW
GFR CALC AF AMER: 30 — AB
GLUCOSE: 72 mg/dL (ref 65–99)
Osmolality: 289 (ref 275–301)
Potassium: 3.8 mmol/L (ref 3.5–5.1)
SODIUM: 135 mmol/L — AB (ref 136–145)

## 2013-08-29 LAB — PLATELET COUNT: PLATELETS: 125 10*3/uL — AB (ref 150–440)

## 2013-08-30 LAB — BASIC METABOLIC PANEL
ANION GAP: 6 — AB (ref 7–16)
BUN: 56 mg/dL — ABNORMAL HIGH (ref 7–18)
CO2: 29 mmol/L (ref 21–32)
Calcium, Total: 8.1 mg/dL — ABNORMAL LOW (ref 8.5–10.1)
Chloride: 99 mmol/L (ref 98–107)
Creatinine: 1.82 mg/dL — ABNORMAL HIGH (ref 0.60–1.30)
EGFR (African American): 41 — ABNORMAL LOW
EGFR (Non-African Amer.): 36 — ABNORMAL LOW
Glucose: 81 mg/dL (ref 65–99)
Osmolality: 283 (ref 275–301)
POTASSIUM: 4 mmol/L (ref 3.5–5.1)
Sodium: 134 mmol/L — ABNORMAL LOW (ref 136–145)

## 2013-08-31 LAB — BASIC METABOLIC PANEL
Anion Gap: 6 — ABNORMAL LOW (ref 7–16)
BUN: 56 mg/dL — ABNORMAL HIGH (ref 7–18)
CALCIUM: 8.4 mg/dL — AB (ref 8.5–10.1)
Chloride: 99 mmol/L (ref 98–107)
Co2: 28 mmol/L (ref 21–32)
Creatinine: 1.73 mg/dL — ABNORMAL HIGH (ref 0.60–1.30)
GFR CALC AF AMER: 44 — AB
GFR CALC NON AF AMER: 38 — AB
Glucose: 89 mg/dL (ref 65–99)
OSMOLALITY: 281 (ref 275–301)
POTASSIUM: 4 mmol/L (ref 3.5–5.1)
Sodium: 133 mmol/L — ABNORMAL LOW (ref 136–145)

## 2013-09-01 DIAGNOSIS — I4891 Unspecified atrial fibrillation: Secondary | ICD-10-CM

## 2013-09-01 DIAGNOSIS — I5023 Acute on chronic systolic (congestive) heart failure: Secondary | ICD-10-CM

## 2013-09-01 LAB — BASIC METABOLIC PANEL
Anion Gap: 6 — ABNORMAL LOW (ref 7–16)
BUN: 45 mg/dL — ABNORMAL HIGH (ref 7–18)
CALCIUM: 8.6 mg/dL (ref 8.5–10.1)
CO2: 28 mmol/L (ref 21–32)
Chloride: 99 mmol/L (ref 98–107)
Creatinine: 1.67 mg/dL — ABNORMAL HIGH (ref 0.60–1.30)
EGFR (African American): 46 — ABNORMAL LOW
EGFR (Non-African Amer.): 39 — ABNORMAL LOW
Glucose: 117 mg/dL — ABNORMAL HIGH (ref 65–99)
Osmolality: 279 (ref 275–301)
POTASSIUM: 3.8 mmol/L (ref 3.5–5.1)
SODIUM: 133 mmol/L — AB (ref 136–145)

## 2013-09-02 ENCOUNTER — Telehealth: Payer: Self-pay

## 2013-09-02 ENCOUNTER — Other Ambulatory Visit: Payer: Self-pay | Admitting: Internal Medicine

## 2013-09-02 NOTE — Telephone Encounter (Signed)
Patient contacted regarding discharge from [facility] on 09/01/13.  Patient understands to follow up with Ignacia Bayley, NP on 09/04/13 at 3:00 at [facility]. Patient understands discharge instructions? yes Patient understands medications and regiment? yes Patient understands to bring all medications to this visit? yes

## 2013-09-03 ENCOUNTER — Ambulatory Visit: Payer: Self-pay | Admitting: Internal Medicine

## 2013-09-04 ENCOUNTER — Ambulatory Visit (INDEPENDENT_AMBULATORY_CARE_PROVIDER_SITE_OTHER): Payer: Medicare Other | Admitting: Nurse Practitioner

## 2013-09-04 ENCOUNTER — Inpatient Hospital Stay: Payer: Self-pay | Admitting: Internal Medicine

## 2013-09-04 ENCOUNTER — Encounter: Payer: Self-pay | Admitting: Nurse Practitioner

## 2013-09-04 VITALS — BP 108/71 | HR 74 | Ht 68.0 in | Wt 178.0 lb

## 2013-09-04 DIAGNOSIS — N183 Chronic kidney disease, stage 3 unspecified: Secondary | ICD-10-CM

## 2013-09-04 DIAGNOSIS — I4891 Unspecified atrial fibrillation: Secondary | ICD-10-CM

## 2013-09-04 DIAGNOSIS — I2510 Atherosclerotic heart disease of native coronary artery without angina pectoris: Secondary | ICD-10-CM

## 2013-09-04 DIAGNOSIS — I5043 Acute on chronic combined systolic (congestive) and diastolic (congestive) heart failure: Secondary | ICD-10-CM

## 2013-09-04 DIAGNOSIS — I5023 Acute on chronic systolic (congestive) heart failure: Secondary | ICD-10-CM

## 2013-09-04 LAB — CBC WITH DIFFERENTIAL/PLATELET
BASOS ABS: 0.1 10*3/uL (ref 0.0–0.1)
Basophil %: 0.8 %
EOS ABS: 0.1 10*3/uL (ref 0.0–0.7)
EOS PCT: 1.1 %
HCT: 38 % — AB (ref 40.0–52.0)
HGB: 12.7 g/dL — ABNORMAL LOW (ref 13.0–18.0)
LYMPHS PCT: 11 %
Lymphocyte #: 0.8 10*3/uL — ABNORMAL LOW (ref 1.0–3.6)
MCH: 32.9 pg (ref 26.0–34.0)
MCHC: 33.4 g/dL (ref 32.0–36.0)
MCV: 99 fL (ref 80–100)
Monocyte #: 0.4 x10 3/mm (ref 0.2–1.0)
Monocyte %: 5.7 %
Neutrophil #: 6.1 10*3/uL (ref 1.4–6.5)
Neutrophil %: 81.4 %
Platelet: 122 10*3/uL — ABNORMAL LOW (ref 150–440)
RBC: 3.86 10*6/uL — AB (ref 4.40–5.90)
RDW: 18.2 % — AB (ref 11.5–14.5)
WBC: 7.5 10*3/uL (ref 3.8–10.6)

## 2013-09-04 LAB — BASIC METABOLIC PANEL
Anion Gap: 6 — ABNORMAL LOW (ref 7–16)
BUN: 40 mg/dL — ABNORMAL HIGH (ref 7–18)
CO2: 28 mmol/L (ref 21–32)
Calcium, Total: 8 mg/dL — ABNORMAL LOW (ref 8.5–10.1)
Chloride: 100 mmol/L (ref 98–107)
Creatinine: 1.36 mg/dL — ABNORMAL HIGH (ref 0.60–1.30)
EGFR (African American): 59 — ABNORMAL LOW
GFR CALC NON AF AMER: 51 — AB
Glucose: 190 mg/dL — ABNORMAL HIGH (ref 65–99)
Osmolality: 283 (ref 275–301)
Potassium: 3.9 mmol/L (ref 3.5–5.1)
Sodium: 134 mmol/L — ABNORMAL LOW (ref 136–145)

## 2013-09-04 NOTE — Progress Notes (Signed)
Patient Name: Corey [name] Date of Encounter: 09/04/2013  Primary Care Provider:  Alisa Graff, MD Primary Cardiologist:  Johnny Bridge, MD   Patient Profile  78 y/o male with h/o CAD, ICM, and recent admission for low-output CHF and cardiorenal syndrome req dobutamine, who presents for f/u.  Problem List   Past Medical History  Diagnosis Date  . Atrial fibrillation     a. s/p ablation, not on anticoagulation s/p Coumadin vasculitis.  Marland Kitchen CAD (coronary artery disease)     a. s/p 4v CABG w/ redo '06; b. cath 08/14/11 high grade lesion in OM graft, total occlusion of SVG to RCA with collaterals,  patent  LIMA to LAD,occlusion of SVG to Diagonal, and severe segmental LV systolic dysfunction, EF 84%;  c. 03/2013 Cath/PCI: 3VD, LIMA->LAD ok, VG->D1 100, VG->OM1 90 (Xience DES), VG->OM3 90(med Rx), VG->RCA 100.  . Ischemic cardiomyopathy     a. EF previously 20-25%;  a. 09/2011 s/p BSX N164 BiV ICD, ser# [id number];  c. 08/2013 Echo: EF 10-15%.  . Chronic systolic CHF (congestive heart failure), NYHA class 4     a. 08/2013 Echo: EF 10-15%, restrictive LV diastolic filling, mild MR/TR, mild biatrial enlargement, mild-mod AI, mod-sev PAH--required dobutamine.  Marland Kitchen HYPERLIPIDEMIA-MIXED   . CKD (chronic kidney disease), stage III   . Mitral regurgitation     a. s/p MV repair '06;  b. 08/2013 Echo: mild MR.  Marland Kitchen HTN (hypertension)   . COPD (chronic obstructive pulmonary disease)   . Complication of anesthesia     "liked to went crazy w/the mess they gave me; started w/an  A"  . ICD (implantable cardiac defibrillator) in place 08/24/11    a. 09/2011 s/p BSX N164 BiV ICD, ser# [id number]  . Type II diabetes mellitus   . Arthritis     "in the neck after neck OR"  . GERD (gastroesophageal reflux disease)   . Anemia   . Colon polyp     a. 08/2012 tubular adenoma x 1.   Past Surgical History  Procedure Laterality Date  . Mitral valve repair      with an angioplasty ring  . Cervical discectomy      C3-C4  .  Cardiac defibrillator placement  ~ 2007    Guidant  . Coronary artery bypass graft  1995    CABG X 2  . Coronary artery bypass graft  2002    CABG "& fixed hole in back of the heart"  . Inguinal hernia repair Bilateral   . Excisional hemorrhoidectomy    . Anterior fusion cervical spine      "went down in my throat to do it"  . Tee without cardioversion  08/30/2011    Procedure: TRANSESOPHAGEAL ECHOCARDIOGRAM (TEE);  Surgeon: Jolaine Artist, MD;  Location: [facility] ENDOSCOPY;  Service: Cardiovascular;  Laterality: N/A;  . Cardioversion  08/30/2011    Procedure: CARDIOVERSION;  Surgeon: Jolaine Artist, MD;  Location: [facility] ENDOSCOPY;  Service: Cardiovascular;  Laterality: N/A;  . Mass biopsy  2013    nasal eposcopic  . Colonoscopy N/A 08/29/2012    Procedure: COLONOSCOPY;  Surgeon: Gatha Mayer, MD;  Location: [facility] ENDOSCOPY;  Service: Endoscopy;  Laterality: N/A;  . Coronary angioplasty with stent placement  03/21/2013    drug eluting stent of the 90% stenosis in SVG from the aorta to OM1.     Allergies  Allergies  Allergen Reactions  . Coumadin [Warfarin Sodium] Hives    Pt states he broke out all  over his body even the bottom of his feet  . Lorazepam Other (See Comments)    "became unresponsive"  . Codeine Other (See Comments)    "made skin crawl"    HPI  78 y/o male with the above complex problem list.  He was recently admitted to [facility] in the setting of several wks of progressive dyspnea, wkns, and reduced exercise tolerance.  He also had significant abd discomfort  His wt had been stable and there was concern for low-output CHF.  Creat was elevated.  Echo showed EF 10-15% with dilated IVC suggestive of volume overload.  EF was down from prior measurements.  Pt developed dyspnea and was subsequently placed on dobutamine and IV lasix with symptomatic improvement, along with improved renal fxn.  He was later weaned off of dobutamine but w/in 24 hrs felt worse again and he was thus  placed back on it.  Again, he perked up some and it was again d/c'd last Sunday morning.  Over the next 24 hrs, his creat remained stable and he felt well.  Wt was also stable. We did consider diagnostic to re-eval anatomy and determine why EF had fallen however creat was never felt to be stable enough to perform.  He was d/c'd to rehab on Monday on his usual meds plus lasix 20mg  daily.  Unfortunately, since d/c, he has noted progressive decline in fxnl status with return of weakness, malaise, dyspnea, wt gain, and lower ext edema.  He gained 5 lbs overnight last night.  In clinic, he is washed out appearing and wishes to be readmitted for further eval.  Home Medications  Prior to Admission medications   Medication Sig Start Date End Date Taking? Authorizing Provider  acetaminophen (TYLENOL) 325 MG tablet Take 650 mg by mouth every 6 (six) hours as needed.   Yes Historical Provider, MD  aspirin 81 MG tablet Take 81 mg by mouth daily.   Yes Historical Provider, MD  benzonatate (TESSALON) 100 MG capsule Take 100 mg by mouth as needed for cough.   Yes Historical Provider, MD  carvedilol (COREG) 6.25 MG tablet Take 6.25 mg by mouth 2 (two) times daily with a meal.   Yes Historical Provider, MD  chlorpheniramine-HYDROcodone (TUSSIONEX) 10-8 MG/5ML LQCR Take 5 mLs by mouth as needed for cough.   Yes Historical Provider, MD  clopidogrel (PLAVIX) 75 MG tablet Take 75 mg by mouth once.  03/23/13  Yes Historical Provider, MD  colchicine (COLCRYS) 0.6 MG tablet Take 1 tablet (0.6 mg total) by mouth daily. 08/11/13  Yes Jessica D. Zehr, PA-C  furosemide (LASIX) 20 MG tablet Take 20 mg by mouth daily.   Yes Historical Provider, MD  glimepiride (AMARYL) 4 MG tablet Take one tablet by mouth daily. 05/16/13  Yes Alisa Graff, MD  hydrALAZINE (APRESOLINE) 10 MG tablet Take 10 mg by mouth 4 (four) times daily.   Yes Historical Provider, MD  insulin aspart (NOVOLOG) 100 UNIT/ML injection Inject into the skin as  directed.   Yes Historical Provider, MD  isosorbide mononitrate (IMDUR) 60 MG 24 hr tablet Take 60 mg by mouth daily. 04/21/13  Yes Minna Merritts, MD  nitroGLYCERIN (NITROSTAT) 0.4 MG SL tablet Place 0.4 mg under the tongue every 5 (five) minutes x 3 doses as needed. For chest pain   Yes Historical Provider, MD  polyethylene glycol powder (GLYCOLAX/MIRALAX) powder Take 17 g by mouth daily.  08/29/12  Yes Gatha Mayer, MD  Pseudoephedrine-DM-GG (ROBITUSSIN CF PO) Take by  mouth as needed.   Yes Historical Provider, MD  triazolam (HALCION) 0.25 MG tablet TAKE 1 TABLET BY MOUTH AT BEDTIME 06/16/13  Yes Alisa Graff, MD    Family History  Family History  Problem Relation Age of Onset  . Hypertension Mother   . Skin cancer Mother   . Heart disease Brother     x 3  . Leukemia Sister     Social History  History   Social History  . Marital Status: Married    Spouse Name: N/A    Number of Children: 2  . Years of Education: N/A   Occupational History  .     Social History Main Topics  . Smoking status: Former Smoker -- 1.50 packs/day for 40 years    Types: Cigarettes, Pipe    Quit date: 03/06/1985  . Smokeless tobacco: Former Systems developer    Types: Snuff, Sarina Ser    Quit date: 03/06/1958     Comment: "quit chewing and dipping in my early 20's"  . Alcohol Use: No     Comment: 08/24/11 "last alcohol 15 years ago or better"  . Drug Use: No  . Sexual Activity: No   Other Topics Concern  . Not on file   Social History Narrative   The patient is single, says he has 2 sons.   He is retired.     Review of Systems General:  Generalized malaise and wkns.  No chills, fever, night sweats or weight changes.  Cardiovascular:  No chest pain, +++dyspnea on exertion, +++edema, +++orthopnea, no palpitations, paroxysmal nocturnal dyspnea. Dermatological: No rash, lesions/masses Respiratory: +++ non prod cough, +++dyspnea Urologic: No hematuria, dysuria Abdominal:   Mild lower abd discomfort -  similar to what he experienced with volume overload prior to last admission.  No nausea, vomiting, diarrhea, bright red blood per rectum, melena, or hematemesis Neurologic:  No visual changes, +++wkns, changes in mental status. All other systems reviewed and are otherwise negative except as noted above.  Physical Exam  Blood pressure 108/71, pulse 74, height 5\' 8"  (1.727 m), weight 178 lb (80.74 kg).  General: Pleasant, NAD Psych: Flat affect. Neuro: Alert and oriented X 3. Moves all extremities spontaneously. HEENT: Normal  Neck: Supple without bruits.  JVP to jaw. Lungs:  Resp regular and unlabored, CTA. Heart: RRR no s3, s4, or murmurs. Abdomen: Soft, mild diffuse lower abd tenderness, non-distended, BS + x 4.  Extremities: No clubbing, cyanosis.  3+bilat LEE to mid calf.  DP/PT/Radials 1+ and equal bilaterally.  Accessory Clinical Findings  ECG - v paced, underlying afib, 74.  Assessment & Plan  1.  Acute on chronic stage IV systolic CHF/ICM:  Pt returns today for office f/u after recent hospitalization during which he required IV dobutamine in the setting of low-output CHF and cardiorenal syndrome.  Though he did improve clinically after a second trial of dobutamine, he has quickly put back on fluid and weight and is quite symptomatic, though hemodynamically stable.  He will be directly admitted to CCU for PICC placement, labs, and dobutamine infusion with IV lasix.  We will need to reconsider right and left heart diagnostic cath with limited contrast while hospitalized in order to assess pressures on inotrope as well as eval anatomy to determine if there are any revascularization options that may improve his EF.  Unfortunately, it appears at this time that he will require home inotropic support going forward.  If this is accurate, he will require referral to our CHF clinic in  [place].  Would hold coreg in setting of low-output and need for inotrope.  Cont afterload reduction (hydral/nitrate)  as pressure allows.  No acei/arb/spiro in setting of CKD and probable acute worsening (labs pending).  He already has an ICD.  2.  CAD:  No chest pain.  In setting of above, need to continue to consider diagnostic cath if creat stable.  Cont asa, plavix, hold bb as above.  He does not appear to be on a statin.    3.  CKD III:  With acute worsening in the setting of low-output CHF on last admission and concern for recurrent worsening now.  F/U BMET upon arrival to CCU.  4.  Afib:  Chronic underlying afib (V paced otw).  He is not on anticoagulation (severe rxn to warfarin in the past).  Follow off of BB.  5.  HTN:  Stable.  6.  DM:  Per IM.   Murray Hodgkins, NP 09/04/2013, 3:55 PM

## 2013-09-04 NOTE — Patient Instructions (Signed)
Please go to [facility] registration in the medical mall  You will be in CCU 6

## 2013-09-05 LAB — BASIC METABOLIC PANEL
ANION GAP: 7 (ref 7–16)
BUN: 34 mg/dL — ABNORMAL HIGH (ref 7–18)
CALCIUM: 7.8 mg/dL — AB (ref 8.5–10.1)
Chloride: 102 mmol/L (ref 98–107)
Co2: 28 mmol/L (ref 21–32)
Creatinine: 1.31 mg/dL — ABNORMAL HIGH (ref 0.60–1.30)
EGFR (Non-African Amer.): 53 — ABNORMAL LOW
Glucose: 65 mg/dL (ref 65–99)
Osmolality: 280 (ref 275–301)
POTASSIUM: 3.4 mmol/L — AB (ref 3.5–5.1)
Sodium: 137 mmol/L (ref 136–145)

## 2013-09-05 LAB — CBC WITH DIFFERENTIAL/PLATELET
Basophil #: 0 10*3/uL (ref 0.0–0.1)
Basophil %: 0.6 %
EOS ABS: 0.1 10*3/uL (ref 0.0–0.7)
Eosinophil %: 1 %
HCT: 36.9 % — AB (ref 40.0–52.0)
HGB: 12.3 g/dL — AB (ref 13.0–18.0)
LYMPHS ABS: 0.9 10*3/uL — AB (ref 1.0–3.6)
Lymphocyte %: 15 %
MCH: 32.8 pg (ref 26.0–34.0)
MCHC: 33.4 g/dL (ref 32.0–36.0)
MCV: 98 fL (ref 80–100)
Monocyte #: 0.4 x10 3/mm (ref 0.2–1.0)
Monocyte %: 7.4 %
Neutrophil #: 4.5 10*3/uL (ref 1.4–6.5)
Neutrophil %: 76 %
Platelet: 117 10*3/uL — ABNORMAL LOW (ref 150–440)
RBC: 3.76 10*6/uL — ABNORMAL LOW (ref 4.40–5.90)
RDW: 18.5 % — ABNORMAL HIGH (ref 11.5–14.5)
WBC: 5.9 10*3/uL (ref 3.8–10.6)

## 2013-09-06 LAB — CBC WITH DIFFERENTIAL/PLATELET
Basophil #: 0 10*3/uL (ref 0.0–0.1)
Basophil %: 0.7 %
Eosinophil #: 0.1 10*3/uL (ref 0.0–0.7)
Eosinophil %: 0.9 %
HCT: 35.6 % — AB (ref 40.0–52.0)
HGB: 11.9 g/dL — ABNORMAL LOW (ref 13.0–18.0)
Lymphocyte #: 0.8 10*3/uL — ABNORMAL LOW (ref 1.0–3.6)
Lymphocyte %: 11.4 %
MCH: 32.9 pg (ref 26.0–34.0)
MCHC: 33.5 g/dL (ref 32.0–36.0)
MCV: 98 fL (ref 80–100)
Monocyte #: 0.5 x10 3/mm (ref 0.2–1.0)
Monocyte %: 6.8 %
Neutrophil #: 5.8 10*3/uL (ref 1.4–6.5)
Neutrophil %: 80.2 %
Platelet: 131 10*3/uL — ABNORMAL LOW (ref 150–440)
RBC: 3.63 10*6/uL — AB (ref 4.40–5.90)
RDW: 17.9 % — AB (ref 11.5–14.5)
WBC: 7.2 10*3/uL (ref 3.8–10.6)

## 2013-09-06 LAB — BASIC METABOLIC PANEL
ANION GAP: 6 — AB (ref 7–16)
BUN: 28 mg/dL — AB (ref 7–18)
CREATININE: 1.06 mg/dL (ref 0.60–1.30)
Calcium, Total: 8.2 mg/dL — ABNORMAL LOW (ref 8.5–10.1)
Chloride: 103 mmol/L (ref 98–107)
Co2: 28 mmol/L (ref 21–32)
EGFR (Non-African Amer.): >60
Glucose: 72 mg/dL (ref 65–99)
Osmolality: 278 (ref 275–301)
Potassium: 3.8 mmol/L (ref 3.5–5.1)
Sodium: 137 mmol/L (ref 136–145)

## 2013-09-07 LAB — BASIC METABOLIC PANEL
ANION GAP: 8 (ref 7–16)
BUN: 26 mg/dL — ABNORMAL HIGH (ref 7–18)
Calcium, Total: 8.2 mg/dL — ABNORMAL LOW (ref 8.5–10.1)
Chloride: 101 mmol/L (ref 98–107)
Co2: 27 mmol/L (ref 21–32)
Creatinine: 1.38 mg/dL — ABNORMAL HIGH (ref 0.60–1.30)
EGFR (African American): 58 — ABNORMAL LOW
GFR CALC NON AF AMER: 50 — AB
Glucose: 90 mg/dL (ref 65–99)
Osmolality: 276 (ref 275–301)
POTASSIUM: 4 mmol/L (ref 3.5–5.1)
Sodium: 136 mmol/L (ref 136–145)

## 2013-09-07 LAB — URIC ACID: URIC ACID: 5.3 mg/dL (ref 3.5–7.2)

## 2013-09-08 ENCOUNTER — Telehealth: Payer: Self-pay

## 2013-09-08 DIAGNOSIS — I25810 Atherosclerosis of coronary artery bypass graft(s) without angina pectoris: Secondary | ICD-10-CM

## 2013-09-08 DIAGNOSIS — E119 Type 2 diabetes mellitus without complications: Secondary | ICD-10-CM

## 2013-09-08 DIAGNOSIS — M109 Gout, unspecified: Secondary | ICD-10-CM

## 2013-09-08 DIAGNOSIS — I10 Essential (primary) hypertension: Secondary | ICD-10-CM

## 2013-09-08 LAB — CBC WITH DIFFERENTIAL/PLATELET
BASOS ABS: 0.1 10*3/uL (ref 0.0–0.1)
BASOS PCT: 1.1 %
EOS ABS: 0.1 10*3/uL (ref 0.0–0.7)
EOS PCT: 1.4 %
HCT: 37.3 % — ABNORMAL LOW (ref 40.0–52.0)
HGB: 11.8 g/dL — ABNORMAL LOW (ref 13.0–18.0)
LYMPHS ABS: 0.6 10*3/uL — AB (ref 1.0–3.6)
LYMPHS PCT: 10.7 %
MCH: 31.3 pg (ref 26.0–34.0)
MCHC: 31.6 g/dL — AB (ref 32.0–36.0)
MCV: 99 fL (ref 80–100)
Monocyte #: 0.5 x10 3/mm (ref 0.2–1.0)
Monocyte %: 8.8 %
Neutrophil #: 4.4 10*3/uL (ref 1.4–6.5)
Neutrophil %: 78 %
Platelet: 128 10*3/uL — ABNORMAL LOW (ref 150–440)
RBC: 3.77 10*6/uL — ABNORMAL LOW (ref 4.40–5.90)
RDW: 18.6 % — ABNORMAL HIGH (ref 11.5–14.5)
WBC: 5.6 10*3/uL (ref 3.8–10.6)

## 2013-09-08 LAB — CK TOTAL AND CKMB (NOT AT ARMC)
CK, Total: 35 U/L — ABNORMAL LOW
CK-MB: 1.5 ng/mL (ref 0.5–3.6)

## 2013-09-08 LAB — BASIC METABOLIC PANEL
Anion Gap: 7 (ref 7–16)
BUN: 23 mg/dL — AB (ref 7–18)
CALCIUM: 8.3 mg/dL — AB (ref 8.5–10.1)
CO2: 27 mmol/L (ref 21–32)
Chloride: 100 mmol/L (ref 98–107)
Creatinine: 1.33 mg/dL — ABNORMAL HIGH (ref 0.60–1.30)
EGFR (African American): >60
EGFR (Non-African Amer.): 52 — ABNORMAL LOW
Glucose: 96 mg/dL (ref 65–99)
OSMOLALITY: 272 (ref 275–301)
POTASSIUM: 4.2 mmol/L (ref 3.5–5.1)
SODIUM: 134 mmol/L — AB (ref 136–145)

## 2013-09-08 LAB — PROTIME-INR
INR: 1.2
INR: 1.2 — AB (ref 0.9–1.1)
PROTHROMBIN TIME: 15.5 s — AB (ref 11.5–14.7)

## 2013-09-08 NOTE — Telephone Encounter (Signed)
Patient is on Raquel's schedule on 09/16/13 as a hospital follow up. She wanted to know if Dr. Nicki Reaper was aware of this and if Dr. Nicki Reaper wanted to see him? If so please move patient from Raquel's schedule over to Dr. Lars Mage. Thank you

## 2013-09-08 NOTE — Telephone Encounter (Signed)
After doing some research, it looks like Mr. Popelka followed up with Cardiology after his hospital visit. I have requested hospital records & will place in folder once received.

## 2013-09-09 LAB — BASIC METABOLIC PANEL
Anion Gap: 8 (ref 7–16)
BUN: 31 mg/dL — AB (ref 7–18)
Calcium, Total: 8.5 mg/dL (ref 8.5–10.1)
Chloride: 100 mmol/L (ref 98–107)
Co2: 27 mmol/L (ref 21–32)
Creatinine: 1.54 mg/dL — ABNORMAL HIGH (ref 0.60–1.30)
EGFR (African American): 50 — ABNORMAL LOW
EGFR (Non-African Amer.): 43 — ABNORMAL LOW
Glucose: 126 mg/dL — ABNORMAL HIGH (ref 65–99)
OSMOLALITY: 278 (ref 275–301)
Potassium: 4.3 mmol/L (ref 3.5–5.1)
Sodium: 135 mmol/L — ABNORMAL LOW (ref 136–145)

## 2013-09-09 NOTE — Telephone Encounter (Signed)
In reviewing, this appt was made on 09/01/13.  He saw cardiology on 09/04/13 and was readmitted to the CCU.  He was in rehab after that hospitalization.    I have not received any information that he has been discharged.  If he has been discharged, then he will probably go back to rehab.  Let me know if still needs appt.  It appears he may not need appt yet if still in hospital or in rehab.

## 2013-09-09 NOTE — Telephone Encounter (Signed)
ER & hospital records received. Pt has been d/c from [place]. ER records in your folder. I contacted Mr. Sexson & he is still in the hospital. I will cancel his appointment with Raquel.

## 2013-09-09 NOTE — Telephone Encounter (Signed)
Noted  

## 2013-09-10 LAB — CBC WITH DIFFERENTIAL/PLATELET
Basophil #: 0.1 10*3/uL (ref 0.0–0.1)
Basophil %: 1 %
Eosinophil #: 0 10*3/uL (ref 0.0–0.7)
Eosinophil %: 0 %
HCT: 34.8 % — ABNORMAL LOW (ref 40.0–52.0)
HGB: 11.4 g/dL — AB (ref 13.0–18.0)
Lymphocyte #: 0.2 10*3/uL — ABNORMAL LOW (ref 1.0–3.6)
Lymphocyte %: 3.9 %
MCH: 32.1 pg (ref 26.0–34.0)
MCHC: 32.6 g/dL (ref 32.0–36.0)
MCV: 98 fL (ref 80–100)
MONOS PCT: 4.2 %
Monocyte #: 0.2 x10 3/mm (ref 0.2–1.0)
Neutrophil #: 5.1 10*3/uL (ref 1.4–6.5)
Neutrophil %: 90.9 %
PLATELETS: 130 10*3/uL — AB (ref 150–440)
RBC: 3.54 10*6/uL — ABNORMAL LOW (ref 4.40–5.90)
RDW: 18.3 % — ABNORMAL HIGH (ref 11.5–14.5)
WBC: 5.6 10*3/uL (ref 3.8–10.6)

## 2013-09-10 LAB — BASIC METABOLIC PANEL
Anion Gap: 8 (ref 7–16)
BUN: 33 mg/dL — ABNORMAL HIGH (ref 7–18)
CALCIUM: 8.3 mg/dL — AB (ref 8.5–10.1)
CO2: 28 mmol/L (ref 21–32)
Chloride: 97 mmol/L — ABNORMAL LOW (ref 98–107)
Creatinine: 1.45 mg/dL — ABNORMAL HIGH (ref 0.60–1.30)
EGFR (Non-African Amer.): 47 — ABNORMAL LOW
GFR CALC AF AMER: 54 — AB
Glucose: 231 mg/dL — ABNORMAL HIGH (ref 65–99)
Osmolality: 281 (ref 275–301)
Potassium: 4.3 mmol/L (ref 3.5–5.1)
Sodium: 133 mmol/L — ABNORMAL LOW (ref 136–145)

## 2013-09-11 LAB — CBC WITH DIFFERENTIAL/PLATELET
Basophil #: 0 10*3/uL (ref 0.0–0.1)
Basophil %: 0.3 %
Eosinophil #: 0 10*3/uL (ref 0.0–0.7)
Eosinophil %: 0.3 %
HCT: 37 % — ABNORMAL LOW (ref 40.0–52.0)
HGB: 12.3 g/dL — AB (ref 13.0–18.0)
LYMPHS ABS: 0.5 10*3/uL — AB (ref 1.0–3.6)
Lymphocyte %: 4.3 %
MCH: 33.5 pg (ref 26.0–34.0)
MCHC: 33.1 g/dL (ref 32.0–36.0)
MCV: 101 fL — AB (ref 80–100)
MONO ABS: 0.8 x10 3/mm (ref 0.2–1.0)
Monocyte %: 7.7 %
NEUTROS ABS: 9.2 10*3/uL — AB (ref 1.4–6.5)
Neutrophil %: 87.4 %
Platelet: 142 10*3/uL — ABNORMAL LOW (ref 150–440)
RBC: 3.66 10*6/uL — ABNORMAL LOW (ref 4.40–5.90)
RDW: 18.3 % — AB (ref 11.5–14.5)
WBC: 10.5 10*3/uL (ref 3.8–10.6)

## 2013-09-11 LAB — BASIC METABOLIC PANEL
ANION GAP: 9 (ref 7–16)
BUN: 47 mg/dL — ABNORMAL HIGH (ref 7–18)
CREATININE: 1.89 mg/dL — AB (ref 0.60–1.30)
Calcium, Total: 8.6 mg/dL (ref 8.5–10.1)
Chloride: 97 mmol/L — ABNORMAL LOW (ref 98–107)
Co2: 27 mmol/L (ref 21–32)
EGFR (African American): 39 — ABNORMAL LOW
EGFR (Non-African Amer.): 34 — ABNORMAL LOW
GLUCOSE: 168 mg/dL — AB (ref 65–99)
Osmolality: 282 (ref 275–301)
Potassium: 4.7 mmol/L (ref 3.5–5.1)
SODIUM: 133 mmol/L — AB (ref 136–145)

## 2013-09-12 DIAGNOSIS — I4891 Unspecified atrial fibrillation: Secondary | ICD-10-CM

## 2013-09-12 DIAGNOSIS — R0989 Other specified symptoms and signs involving the circulatory and respiratory systems: Secondary | ICD-10-CM

## 2013-09-12 DIAGNOSIS — R609 Edema, unspecified: Secondary | ICD-10-CM

## 2013-09-12 DIAGNOSIS — N183 Chronic kidney disease, stage 3 unspecified: Secondary | ICD-10-CM

## 2013-09-12 DIAGNOSIS — R0609 Other forms of dyspnea: Secondary | ICD-10-CM

## 2013-09-12 DIAGNOSIS — M79609 Pain in unspecified limb: Secondary | ICD-10-CM

## 2013-09-12 DIAGNOSIS — R509 Fever, unspecified: Secondary | ICD-10-CM

## 2013-09-12 DIAGNOSIS — I5023 Acute on chronic systolic (congestive) heart failure: Secondary | ICD-10-CM

## 2013-09-12 LAB — BASIC METABOLIC PANEL
ANION GAP: 10 (ref 7–16)
BUN: 55 mg/dL — ABNORMAL HIGH (ref 7–18)
CALCIUM: 8.5 mg/dL (ref 8.5–10.1)
Chloride: 99 mmol/L (ref 98–107)
Co2: 25 mmol/L (ref 21–32)
Creatinine: 1.75 mg/dL — ABNORMAL HIGH (ref 0.60–1.30)
EGFR (Non-African Amer.): 37 — ABNORMAL LOW
GFR CALC AF AMER: 43 — AB
Glucose: 118 mg/dL — ABNORMAL HIGH (ref 65–99)
OSMOLALITY: 284 (ref 275–301)
POTASSIUM: 4.3 mmol/L (ref 3.5–5.1)
SODIUM: 134 mmol/L — AB (ref 136–145)

## 2013-09-13 ENCOUNTER — Other Ambulatory Visit: Payer: Self-pay | Admitting: Cardiovascular Disease

## 2013-09-13 LAB — BASIC METABOLIC PANEL
Anion Gap: 7 (ref 7–16)
BUN: 51 mg/dL — AB (ref 7–18)
CALCIUM: 8.3 mg/dL — AB (ref 8.5–10.1)
Chloride: 98 mmol/L (ref 98–107)
Co2: 29 mmol/L (ref 21–32)
Creatinine: 1.85 mg/dL — ABNORMAL HIGH (ref 0.60–1.30)
EGFR (African American): 40 — ABNORMAL LOW
EGFR (Non-African Amer.): 35 — ABNORMAL LOW
GLUCOSE: 166 mg/dL — AB (ref 65–99)
Osmolality: 286 (ref 275–301)
Potassium: 3.9 mmol/L (ref 3.5–5.1)
Sodium: 134 mmol/L — ABNORMAL LOW (ref 136–145)

## 2013-09-14 LAB — CREATININE, SERUM
CREATININE: 1.84 mg/dL — AB (ref 0.60–1.30)
EGFR (African American): 41 — ABNORMAL LOW
EGFR (Non-African Amer.): 35 — ABNORMAL LOW

## 2013-09-15 LAB — CREATININE, SERUM
Creatinine: 1.66 mg/dL — ABNORMAL HIGH (ref 0.60–1.30)
EGFR (Non-African Amer.): 40 — ABNORMAL LOW
GFR CALC AF AMER: 46 — AB

## 2013-09-16 ENCOUNTER — Ambulatory Visit: Payer: BC Managed Care – PPO | Admitting: Adult Health

## 2013-09-16 ENCOUNTER — Ambulatory Visit (INDEPENDENT_AMBULATORY_CARE_PROVIDER_SITE_OTHER): Payer: Medicare Other | Admitting: *Deleted

## 2013-09-16 ENCOUNTER — Telehealth: Payer: Self-pay | Admitting: *Deleted

## 2013-09-16 DIAGNOSIS — I4891 Unspecified atrial fibrillation: Secondary | ICD-10-CM

## 2013-09-16 DIAGNOSIS — I5022 Chronic systolic (congestive) heart failure: Secondary | ICD-10-CM

## 2013-09-16 DIAGNOSIS — I5023 Acute on chronic systolic (congestive) heart failure: Secondary | ICD-10-CM

## 2013-09-16 LAB — MDC_IDC_ENUM_SESS_TYPE_INCLINIC
Implantable Pulse Generator Serial Number: 104248
Lead Channel Setting Pacing Amplitude: 3.5 V
Lead Channel Setting Pacing Pulse Width: 0.4 ms
Lead Channel Setting Sensing Sensitivity: 0.5 mV
MDC IDC SET LEADCHNL LV PACING PULSEWIDTH: 1 ms
MDC IDC SET LEADCHNL LV SENSING SENSITIVITY: 1 mV
MDC IDC SET LEADCHNL RA PACING AMPLITUDE: 3 V
MDC IDC SET LEADCHNL RV PACING AMPLITUDE: 2.4 V
MDC IDC SET ZONE DETECTION INTERVAL: 353 ms
MDC IDC SET ZONE DETECTION INTERVAL: 462 ms
Zone Setting Detection Interval: 286 ms

## 2013-09-16 LAB — CREATININE, SERUM
CREATININE: 2.15 mg/dL — AB (ref 0.60–1.30)
GFR CALC AF AMER: 34 — AB
GFR CALC NON AF AMER: 29 — AB

## 2013-09-16 LAB — EXPECTORATED SPUTUM ASSESSMENT W REFEX TO RESP CULTURE

## 2013-09-16 LAB — WBC: WBC: 8.6 10*3/uL (ref 3.8–10.6)

## 2013-09-16 NOTE — Progress Notes (Signed)
ICD interrogation in patient CCU @ [facility].  ICD therapies off per Dr. Caryl Comes.

## 2013-09-16 NOTE — Telephone Encounter (Signed)
Received a call notifying that [name] will be discharged tomorrow from [facility]. The original plans were to send to rehab ([facility]). The family has decided not to go that route & would like for him to be discharged home with Hospice care. Needed a verbal order for Hospice care. (Verbal order given)

## 2013-09-17 LAB — CULTURE, BLOOD (SINGLE)

## 2013-09-17 LAB — VANCOMYCIN, TROUGH: Vancomycin, Trough: 24 ug/mL (ref 10–20)

## 2013-09-17 NOTE — Telephone Encounter (Signed)
Records requested

## 2013-09-17 NOTE — Telephone Encounter (Signed)
Noted.  Thanks.  Need discharge summary from hospital.

## 2013-09-18 ENCOUNTER — Telehealth: Payer: Self-pay | Admitting: Internal Medicine

## 2013-09-18 NOTE — Telephone Encounter (Signed)
See below

## 2013-09-18 NOTE — Telephone Encounter (Signed)
Person called in and stated Corey [name] was discharged from hospital recently and was on sliding scale insulin. Was needing to get an order from doctor to presume glintercide 4mg  once a day.

## 2013-09-18 NOTE — Telephone Encounter (Signed)
With his worsening renal function ( prior to and during his hospitalization), I would hold off on restarting amaryl.  Need to know how his sugars are running now.  Will get a trend and then see what medication is needed.  For now, if hospital discharged him on sliding scale insulin, then I would leave him on sliding scale until can get trend of sugar readings.

## 2013-09-19 ENCOUNTER — Encounter: Payer: Self-pay | Admitting: *Deleted

## 2013-09-19 NOTE — Telephone Encounter (Signed)
Records received & placed in your folder for review

## 2013-09-19 NOTE — Telephone Encounter (Signed)
Letter faxed to Hospice requesting information below and medication recommendation attached

## 2013-09-19 NOTE — Telephone Encounter (Signed)
Will review on return to office.

## 2013-09-24 ENCOUNTER — Encounter: Payer: Medicare Other | Admitting: Physician Assistant

## 2013-09-26 ENCOUNTER — Telehealth: Payer: Self-pay | Admitting: *Deleted

## 2013-09-26 NOTE — Telephone Encounter (Signed)
Please call [facility] nurse w/ Hospice regarding d/c or decrease Dobutamin drip. Patient is non responsive

## 2013-09-26 NOTE — Telephone Encounter (Signed)
Spoke w/ Helene Kelp, hospice nurse.  She states that pt has stopped eating, is nonresponsive and only moans and groans. She states that pt's son Arnette Norris is requesting that pt's dobutamine drip (4mg /ml 5.9/hr continuously) be decreased or d/c'd.  Spoke w/ Dr. Rockey Situ who gave verbal order to d/c dobutamine drip. This message was relayed to Helene Kelp who verbalizes understanding.  She is to call back if we can be of further assistance.

## 2013-09-29 ENCOUNTER — Telehealth: Payer: Self-pay | Admitting: *Deleted

## 2013-09-29 NOTE — Telephone Encounter (Signed)
Received notification that [name] passed away on Oct 11, 2013 @ 8:30am. Sympathy card mailed to home address, status changed in chart & Dr. Nicki Reaper notified.

## 2013-10-04 ENCOUNTER — Ambulatory Visit: Payer: Self-pay | Admitting: Internal Medicine

## 2013-10-04 DEATH — deceased

## 2013-10-08 ENCOUNTER — Ambulatory Visit: Payer: Medicare Other | Admitting: Internal Medicine

## 2013-10-10 ENCOUNTER — Encounter: Payer: Self-pay | Admitting: Internal Medicine

## 2013-10-17 ENCOUNTER — Ambulatory Visit: Payer: Medicare Other | Admitting: Internal Medicine

## 2013-10-20 ENCOUNTER — Ambulatory Visit: Payer: Medicare Other | Admitting: Internal Medicine

## 2013-10-22 ENCOUNTER — Ambulatory Visit: Payer: Medicare Other | Admitting: Cardiovascular Disease

## 2013-11-24 ENCOUNTER — Encounter: Payer: Self-pay | Admitting: Cardiovascular Disease

## 2013-11-24 ENCOUNTER — Encounter: Payer: Self-pay | Admitting: Nurse Practitioner

## 2013-11-24 ENCOUNTER — Encounter: Payer: Self-pay | Admitting: Internal Medicine

## 2014-02-12 ENCOUNTER — Encounter (HOSPITAL_COMMUNITY): Payer: Self-pay | Admitting: Cardiovascular Disease

## 2014-03-19 ENCOUNTER — Encounter (HOSPITAL_COMMUNITY): Payer: Self-pay | Admitting: Internal Medicine

## 2014-06-23 NOTE — Consult Note (Signed)
General Aspect 79 yo male with history of CAD s/p CABG, recent NSTEMI in June 2013 with medical management of CAD, ICD for VT, chronic atrial fibrillation on coumadin therapy, DM who was admitted with rash on lower extremities and volume overload. Recent admission 2 weeks ago with atrial fibrillation. He reports 2 weeks of progressive SOB and orthopnea since discharge. He has had a rash on his legs for 2 weeks and has been treated as an outpatient for cellulitis. INR has been therapeutic at home.  He tells me that he has had a dry cough and weight gain with increased lower extremity edema since discharge from [facility] 2 weeks ago. No chest pain or jaw pain (his anginal equivalent). He has been taking Lasix 40 mg po BID at home. He was restarted on coumadin therapy two weeks ago. He fell and hit both lower legs causing bruising. The bruising has progressed to include a large area of ecchymosis over the left anterior leg and right lower leg above his achilles. There are also small purpuric areas on both legs up to the thighs. Denies palpitations.    Present Illness PMH: CAD s/p CABG, ICD impanted for VT, atrial fibrillation, DM  PSH: CABG with redo, ICD implantation, cervical diskectomy, MV repair with angioplasty ring, inguinal hernia repair  All: Codeine  Meds: see computer list  FH: HTN  SH: Former smoker. No alcohol or drugs.   Physical Exam:   GEN well developed, well nourished, no acute distress    HEENT PERRL, moist oral mucosa    NECK supple  No masses  thyroid not tender  trachea midline    RESP normal resp effort  clear BS    CARD Regular rate and rhythm  No murmur    ABD denies tenderness  soft  normal BS    LYMPH negative neck    EXTR 1-2+ bilateral lower extremity edema. Purpuric rash over both lower extremities with two large areas, one over left shin and the other on posterior aspect of right leg above achilles    SKIN positive rashes, skin turgor good    NEURO  cranial nerves intact    PSYCH alert, A+O to time, place, person   Review of Systems:   Subjective/Chief Complaint As stated in HPI and otherwise negative.   Lab Results: Routine Chem:  10-Aug-13 16:39    Glucose, Serum  198   BUN  27   Creatinine (comp)  1.67   Sodium, Serum 140   Potassium, Serum 3.9   Chloride, Serum 107   CO2, Serum 24   Calcium (Total), Serum  8.4   Anion Gap 9   Osmolality (calc) 290   eGFR (African American)  46   eGFR (Non-African American)  40 (eGFR values <27m/min/1.73 m2 may be an indication of chronic kidney disease (CKD). Calculated eGFR is useful in patients with stable renal function. The eGFR calculation will not be reliable in acutely ill patients when serum creatinine is changing rapidly. It is not useful in  patients on dialysis. The eGFR calculation may not be applicable to patients at the low and high extremes of body sizes, pregnant women, and vegetarians.)   B-Type Natriuretic Peptide ([facility]  6507 (Result(s) reported on 14 Oct 2011 at 06:05PM.)  11-Aug-13 00:44    Glucose, Serum  122   BUN  26   Creatinine (comp)  1.44   Sodium, Serum 140   Potassium, Serum 3.9   Chloride, Serum 105   CO2,  Serum 26   Calcium (Total), Serum 8.6   Anion Gap 9   Osmolality (calc) 285   eGFR (African American)  55   eGFR (Non-African American)  48 (eGFR values <65m/min/1.73 m2 may be an indication of chronic kidney disease (CKD). Calculated eGFR is useful in patients with stable renal function. The eGFR calculation will not be reliable in acutely ill patients when serum creatinine is changing rapidly. It is not useful in  patients on dialysis. The eGFR calculation may not be applicable to patients at the low and high extremes of body sizes, pregnant women, and vegetarians.)  Cardiac:  10-Aug-13 16:39    CK, Total 77   CPK-MB, Serum 1.0 (Result(s) reported on 14 Oct 2011 at 05:33PM.)   Troponin I < 0.02 (0.00-0.05 0.05 ng/mL or less:  NEGATIVE  Repeat testing in 3-6 hrs  if clinically indicated. >0.05 ng/mL: POTENTIAL  MYOCARDIAL INJURY. Repeat  testing in 3-6 hrs if  clinically indicated. NOTE: An increase or decrease  of 30% or more on serial  testing suggests a  clinically important change)  11-Aug-13 00:44    CK, Total 66   CPK-MB, Serum 1.0 (Result(s) reported on 15 Oct 2011 at 01:37AM.)   Troponin I < 0.02 (0.00-0.05 0.05 ng/mL or less: NEGATIVE  Repeat testing in 3-6 hrs  if clinically indicated. >0.05 ng/mL: POTENTIAL  MYOCARDIAL INJURY. Repeat  testing in 3-6 hrs if  clinically indicated. NOTE: An increase or decrease  of 30% or more on serial  testing suggests a  clinically important change)    09:02    CK, Total 55   CPK-MB, Serum 1.1 (Result(s) reported on 15 Oct 2011 at 09:41AM.)   Troponin I < 0.02 (0.00-0.05 0.05 ng/mL or less: NEGATIVE  Repeat testing in 3-6 hrs  if clinically indicated. >0.05 ng/mL: POTENTIAL  MYOCARDIAL INJURY. Repeat  testing in 3-6 hrs if  clinically indicated. NOTE: An increase or decrease  of 30% or more on serial  testing suggests a  clinically important change)  Routine Coag:  10-Aug-13 16:39    Prothrombin  27.6   INR 2.6 (INR reference interval applies to patients on anticoagulant therapy. A single INR therapeutic range for coumarins is not optimal for all indications; however, the suggested range for most indications is 2.0 - 3.0. Exceptions to the INR Reference Range may include: Prosthetic heart valves, acute myocardial infarction, prevention of myocardial infarction, and combinations of aspirin and anticoagulant. The need for a higher or lower target INR must be assessed individually. Reference: The Pharmacology and Management of the Vitamin K  antagonists: the seventh ACCP Conference on Antithrombotic and Thrombolytic Therapy. CYELYH.9093Sept:126 (3suppl): [id number] A HCT value >55% may artifactually increase the PT.  In one study,  the  increase was an average of 25%. Reference:  "Effect on Routine and Special Coagulation Testing Values of Citrate Anticoagulant Adjustment in Patients with High HCT Values." American Journal of Clinical Pathology 2006;126:400-405.)  Routine Hem:  10-Aug-13 16:39    WBC (CBC) 5.8   RBC (CBC)  3.80   Hemoglobin (CBC)  11.3   Hematocrit (CBC)  34.0   Platelet Count (CBC) 245 (Result(s) reported on 14 Oct 2011 at 05:16PM.)   MCV 90   MCH 29.6   MCHC 33.1   RDW  17.1   Neutrophil % 80.3   Lymphocyte % 10.3   Monocyte % 7.7   Eosinophil % 1.0   Basophil % 0.7   Neutrophil # 4.7   Lymphocyte #  0.6   Monocyte # 0.4   Eosinophil # 0.1   Basophil # 0.0   Reference Accession# [id number] (Result(s) reported on 14 Oct 2011 at 07:03PM.)  11-Aug-13 00:44    WBC (CBC) 7.6   RBC (CBC)  3.94   Hemoglobin (CBC)  11.7   Hematocrit (CBC)  35.2   Platelet Count (CBC) 259   MCV 89   MCH 29.6   MCHC 33.1   RDW  17.3   Neutrophil % 85.4   Lymphocyte % 8.8   Monocyte % 4.7   Eosinophil % 0.6   Basophil % 0.5   Neutrophil # 6.5   Lymphocyte #  0.7   Monocyte # 0.4   Eosinophil # 0.0   Basophil # 0.0 (Result(s) reported on 15 Oct 2011 at 01:25AM.)   EKG:   EKG Interp. by me    Interpretation V-paced.   Radiology Results: XRay:    10-Aug-13 16:48, Chest PA and Lateral   Chest PA and Lateral    REASON FOR EXAM:    SOB  COMMENTS:       PROCEDURE: DXR - DXR CHEST PA (OR AP) AND LATERAL  - Oct 14 2011  4:48PM     RESULT: Comparison is made to the study dated 09 October 2011.    Left-sided pacemaker device is present. The cardiac silhouette is   enlarged but unchanged. Atherosclerotic calcification is seen. There is   pulmonary vascular congestion with mild interstitial edema. There is no   consolidation, effusion or pneumothorax.    IMPRESSION:   1. Cardiomegaly with mild pulmonary edema.    Dictation Site: 6          Verified By: Sundra Aland, M.D., MD    No Known  Allergies:   Vital Signs/Nurse's Notes: **Vital Signs.:   11-Aug-13 03:48   Vital Signs Type Routine   Temperature Temperature (F) 97.8   Celsius 36.5   Temperature Source Oral   Pulse Pulse 80   Systolic BP Systolic BP 989   Diastolic BP (mmHg) Diastolic BP (mmHg) 71   Mean BP 83   Pulse Ox % Pulse Ox % 97   Pulse Ox Activity Level  At rest   Oxygen Delivery 2L    08:40   Vital Signs Type Routine   Temperature Temperature (F) 98.3   Celsius 36.8   Pulse Pulse 75   Respirations Respirations 18   Systolic BP Systolic BP 211   Diastolic BP (mmHg) Diastolic BP (mmHg) 74   Mean BP 90   Pulse Ox % Pulse Ox % 98   Pulse Ox Activity Level  At rest   Oxygen Delivery 2L  *Intake and Output.:   11-Aug-13 02:34   Grand Totals Intake:   Output:  50    Net:  -47 24 Hr.:  -50   Urine ml     Out:  50   Urinary Method  Void; Urinal    03:49   Grand Totals Intake:   Output:  100    Net:  -100 24 Hr.:  -150   Urine ml     Out:  100   Urinary Method  Void; Urinal    04:06   Grand Totals Intake:   Output:      Net:   62 Hr.:  -150   Weight Type daily   Weight Method Bed   Current Weight (lbs) (lbs) 183   Current Weight (kg) (kg) 83   Height (ft) (feet)  5   Height (in) (in) 9   Height (cm) centimeters 175.2   BSA (m2) 1.9   BMI (kg/m2) 27    Shift 07:00   Grand Totals Intake:   Output:  150    Net:  -150 24 Hr.:  -150   Urine ml     Out:  150   Length of Stay Totals Intake:   Output:  150    Net:  -150    Daily 07:00   Grand Totals Intake:   Output:  150    Net:  -150 24 Hr.:  -150   Urine ml     Out:  150   Length of Stay Totals Intake:   Output:  150    Net:  -150    08:00   Grand Totals Intake:   Output:  300    Net:  -300 24 Hr.:  -300   Percentage of Meal Eaten  90   Urine ml     Out:  300    Shift 15:00   Grand Totals Intake:   Output:  300    Net:  -300 24 Hr.:  -300   Urine ml     Out:  300   Length of Stay Totals Intake:   Output:   450    Net:  -450     Impression 1. Atrial fibrillation: Rate controlled. His INR is therapeutic on coumadin. There is a question of coumadin related skin necrosis. I think switching to Xarelto is reasonable. His current creatinine clearance is 54. The Xarelto could be started at 20 mg po once daily, however, would not start Xarelto today. Would recheck INR in am and start Xarelto when INR is less than 2.0. He will need to have his renal function followed closely. ? baseline renal function.   2. Acute on chronic systolic CHF: He is diuresing with IV Lasix. Would continue today and will reassess in am. Continue other meds.   3. Rash: This could be secondary to coumadin related skin necrosis. Agree with stopping coumadin. May need dermatology involvement. This could also be related to a vasculitis. No history of PAD. Would check arterial dopplers of lower extremities in am to exclude PAD. Follow closely with wound care.   4. Ischemic cardiomyopathy: Cannot access [facility] records but pt has reduced LVEF by history. ICD in place. Continue current meds.   Electronic Signatures: Burnell Blanks (MD)  (Signed 11-Aug-13 11:09)  Authored: General Aspect/Present Illness, History and Physical Exam, Review of System, Labs, EKG , Radiology, Allergies, Vital Signs/Nurse's Notes, Impression/Plan   Last Updated: 11-Aug-13 11:09 by Burnell Blanks (MD)

## 2014-06-23 NOTE — Discharge Summary (Signed)
PATIENT NAME:  Corey [name], Corey [name] MR#:  [id number] DATE OF BIRTH:  [birth year]  DATE OF ADMISSION:  10/14/2011 DATE OF DISCHARGE:  10/19/2011  DISCHARGE DIAGNOSES: 1. Acute respiratory failure likely due to acute on chronic systolic heart failure close to baseline.  2. Acute on chronic systolic heart failure exacerbation. Echo showing ejection fraction of 25% on previous admission on beta blocker, Lasix, Imdur, aspirin. Cannot give angiotensin-converting-enzyme inhibitor due to renal dysfunction.  3. 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 4. Chronic kidney disease, stage II. Creatinine at baseline.  5. Ischemic cardiomyopathy, on aspirin not on statin secondary to myalgias. 6. Atrial fibrillation, paroxysmal. Now rate controlled and normal sinus rhythm. The patient did not want to start Xarelto at this time. Will be reconsidered as an outpatient.   SECONDARY DIAGNOSES: 1. Coronary artery disease. 2. History of coronary artery bypass graft. 3. History of atrial fibrillation.  4. Diabetes mellitus. 5. History of ventricular tachycardia status post implantable cardiac defibrillator placement.   CONSULTATION: Cardiology, Dr. Fletcher Anon. GI consult and dermatology consult.  PROCEDURES/RADIOLOGY:  1. Skin biopsy done by Dr. Nehemiah Massed on 08/13, results showing vasculitis.  2. Chest x-ray on 08/10 showing cardiomegaly with mild pulmonary edema.  MAJOR LABORATORY PANEL: [facility] spotted fever IgM antibody was within normal limits/negative.   HISTORY AND SHORT HOSPITAL COURSE: The patient is a 79 year old male with above-mentioned medical problems, was admitted for acute on chronic respiratory failure thought to be secondary to congestive heart failure exacerbation (acute on chronic systolic). He was started on diuresis along with nitroglycerin, spironolactone, digoxin. Please see Dr. Lupita Dawn dictated history and physical for  further details.  The patient was evaluated by cardiology from [facility] group, and they recommended stopping Coumadin as patient had possible Coumadin skin necrosis. Xarelto was considered, although the patient did not want to start that while in the hospital. His rash still continued to be painful and not improving, for which wound care consult was obtained who provided recommendation for dressing changes. The patient was also evaluated by Dermatology Dr. Nehemiah Massed who recommended a skin biopsy, and it was done on thirteenth August. Results came back today showing and confirming vasculitis, for which the patient will require outpatient follow-up. The patient was otherwise doing much better on fifteenth of August and is being discharged home in stable condition.   PHYSICAL EXAMINATION: VITAL SIGNS: On the date of discharge, his vital signs are as follows: Temperature 98.6, heart rate 71 per minute, respiration 20 per minute, blood pressure 115/68. He was saturating 95% on room air.   CARDIOVASCULAR: S1 and S2 normal. No murmurs, rubs, or gallops.   LUNGS: Clear to auscultation bilaterally. No wheezing, rales, rhonchi, or crepitation.   ABDOMEN: Soft, benign.   NEUROLOGIC: Nonfocal examination.  All other physical examination remained at the baseline except skin. He had 2 areas which were draining, one on the right medial ankle with scant serosanguineous discharge. Another one was on the left medial ankle measuring 4 x 6.5 cm x 0 cm with scant sanguinous discharge. All other physical examination remained at baseline.   DISCHARGE MEDICATIONS: 1. Aspirin 81 mg p.o. daily.  2. Metoprolol 25 mg p.o. b.i.d.  3. Klor-Con 20 mEq p.o. b.i.d. 4. Glimepiride 4 mg 2 tablets p.o. daily.  5. Triazolam 0.25 mg p.o. daily.  6. Isosorbide mononitrate 30 mg at bedtime. 7. Lasix 40 mg p.o. b.i.d.  8. Digoxin 0.125 mg p.o. daily.   DISCHARGE DIET: Low sodium.  180 ADA.  DISCHARGE ACTIVITY: As  tolerated.  DISCHARGE INSTRUCTIONS AND FOLLOW-UP: The patient was instructed to keep dressing dry and have it changed on follow-up with [facility] on coming Wednesday. He will later follow-up with his primary care physician Dr. Luan Pulling in 1 to 2 weeks and Dr. Rockey Situ from cardiology in 2 to 4 weeks. He will Follow up with [facility], with Dr. Nehemiah Massed on coming Wednesday on the 21st of August as scheduled where further dressing changes and treatment for his vasculitis would be provided.  TIME: Total time discharging this patient: 55 minutes.    ____________________________ Lucina Mellow. Manuella Ghazi, MD vss:kma D: 10/19/2011 22:26:05 ET T: 10/20/2011 14:26:32 ET JOB#: [id number]  cc: Malak Orantes S. Manuella Ghazi, MD, <Dictator> Dr. Dub Mikes, MD Mertie Clause. Fletcher Anon, Kenton Vale MD ELECTRONICALLY SIGNED 10/22/2011 23:40

## 2014-06-23 NOTE — H&P (Signed)
PATIENT NAME:  Corey [name], Corey [name] MR#:  [id number] DATE OF BIRTH:  08-07-1935  DATE OF ADMISSION:  10/14/2011  CHIEF COMPLAINT: Shortness of breath.   HISTORY OF PRESENT ILLNESS: Mr. Arnall is a 79 year old white male with a history of severe multivessel coronary artery disease by catheterization done during June 2013 admission to [facility] with a non-ST-segment elevation myocardial infarction. He underwent implantable cardiac defibrillator replacement and cardiac catheterization at that time which revealed severe multivessel native coronary artery disease with some patent and some occluded grafts. He was readmitted in July at which time he was in atrial fibrillation and was started back on Coumadin, which he had been on many years before. He states that for the last two weeks since discharge home he has had progressive shortness of breath and orthopnea. He weighs himself daily but does not adjust his usual Lasix dose, which is twice daily. He has noted weight gain of several pounds. He also noted the onset of a purpuric rash which started on both ankles. Initially the rash started as a bruise after sustaining a mild tumble at home where he banged his ankles on something on his patio, but he developed multiple purpuric spots on his legs and his primary care doctor, Dr. Luan Pulling, treated him for cellulitis with Levaquin and doxycycline. However, the patient has no signs of cellulitis on exam. He also states that since he has been home he has not been outside and has no recent exposure to insects so he could not have possibly gotten a tick bite. His Coumadin level has been therapeutic and his last INR prior to admission was on 09/29/2011 at which time pro time was 28.3 and INR was 2.6.   The patient's primary care doctor is Dr. Larene Beach and his cardiologist is Dr. Lia Foyer of [facility]. His implantable cardiac defibrillator was replaced by Dr. Cristopher Peru in June of 2013.   PAST MEDICAL HISTORY:   1. Coronary artery disease, severe multivessel, status post non-ST-segment elevation myocardial infarction in June of 2013 status post cardiac catheterization with no stents placed due to severity of disease.  2. History of several vessel CABG with SVG to OM1 and OM2 which were noted to be patent on recent catheterization, LIMA to the LAD which was also patent on recent catheterization and occluded SVG graft to the RCA and diagonals noted on recent catheterization.  3. History of atrial fibrillation now on Coumadin.  4. History of diabetes mellitus, poorly controlled, last A1c was 8.1 in June. 5. History of ventricular tachycardia status post implantable cardiac defibrillator placement with replacement done June 2013 by Dr. Lovena Le.   MEDICATIONS:  1. He was given doxycycline and Levaquin on 10/09/2011 by his primary care doctor and was taking them up until today.  2. Coumadin 5 mg daily.  3. Triazolam 0.25 mg at bedtime. 4. Senokot-S two tablets as needed for constipation.  5. Potassium chloride 20 mEq daily in divided doses.  6. Pantoprazole 40 mg daily. 7. Nitroglycerin as needed. 8. Metoprolol 50 mg by mouth twice daily.  9. Metformin XR 500 mg daily.  10. Isosorbide mononitrate 60 mg daily.  11. Glimepiride 4 mg daily.  12. Furosemide 40 mg twice daily.  13. Fluticasone 50 mcg nasal spray two sprays in the nose daily as needed. 14. Aspirin 81 mg. 15. Tylenol as needed for pain.   PAST SURGICAL HISTORY:  1. Coronary artery bypass graft, date unknown, multivessel.  2. Biventricular implantable cardiac defibrillator, replaced June 2013 by  Dr. Cristopher Peru. 3. Cervical diskectomy at C3/C4, remotely. 4. Mitral valve repair with angioplasty ring. 5. Redo of CABG, date unknown. 6. Inguinal hernia repair, date unknown.   FAMILY HISTORY: Notable for hypertension.   DRUG ALLERGIES: He has allergies to codeine which causes a rash.   LAST HOSPITALIZATION: His last hospitalization was  09/25/2011 at [facility].   SOCIAL HISTORY: He has been widowed since 76. He quit smoking in 1987. He does not drink.  REVIEW OF SYSTEMS: Positive for fatigue and weight gain of 3 to 4 pounds. He denies any changes in vision. He denies any epistaxis or difficulty swallowing. He has had a cough productive of white sputum and increasing dyspnea over the last two months. He denies painful respirations. He denies chest pain but has been having orthopnea and edema as well as dyspnea with exertion. He has not had any nausea, vomiting, diarrhea, or abdominal pain. No change in bowel habits. No dysuria, hematuria, or frequency. He denies any symptoms of prostatitis or hernia. He has had some polyuria secondary to use of Lasix. No history of thyroid problems. No history of anemia, but does have easy bruising secondary to Coumadin dosing. He does have a history of recent purpuric rash which started about two weeks ago. He has chronic neck pain and shoulder pain. He has no history of numbness, strokes, or transient ischemic attacks. He has a history of insomnia treated with Halcion.   PHYSICAL EXAMINATION:   GENERAL: This is a well nourished elderly male in mild distress secondary to shortness of breath.   VITAL SIGNS: Blood pressure 123/80, pulse 76, respirations 20, pain scale 9 out of 10, and pulse oximetry 97% on room air.   HEENT: Pupils are equal, round, and reactive to light. Extraocular movements are intact. Sclerae are anicteric. Oropharynx is benign.   NECK: Supple without lymphadenopathy, JVD, thyromegaly, or carotid bruits.   LUNGS: Clear to auscultation bilaterally with no wheezing or rhonchi.   CARDIOVASCULAR: Irregularly irregular. Chest wall is nontender. Pedal pulses are difficult to appreciate due to 2+ pitting edema.   BREASTS: Nontender. There is no axillary adenopathy.   ABDOMEN: Soft, nontender, and nondistended with good bowel sounds and no evidence of hepatosplenomegaly.   GU:  Deferred.   MUSCULOSKELETAL: He is out of 5 out of 5 musculoskeletal strength. Gait was not tested due to the patient's dyspnea.   SKIN: Skin is notable for large and small purpuric lesions covering both legs from ankles to thighs.   LYMPH: There is no cervical, axillary, inguinal, or supraclavicular lymphadenopathy.   NEUROLOGICAL: Cranial nerves II through XII are intact. The patient has no evidence of dysarthria or aphasia. He is alert and oriented to person, place, and time.   ADMISSION LABS/RADIOLOGIC STUDIES: B-type natriuretic peptide is 6507. Sodium is 140, potassium 3.9, chloride 107, bicarbonate 24, BUN 27, creatinine 1.67, and calcium 8.4. CK is 77, CK-MB 1.0, and troponin I is less than 0.02. White count is 5.8, hemoglobin 11.3, and platelets 245. Pro time is 27.6 and INR is 2.6.   PA and lateral chest x-ray shows left-sided pacemaker device and enlarged cardiac silhouette unchanged from prior films with atherosclerotic calcifications seen. There is some pulmonary vascular congestion with mild interstitial edema.  EKG shows a paced rhythm.   ASSESSMENT AND PLAN:  1. Acute respiratory failure secondary to pulmonary edema secondary to acute on chronic systolic dysfunction. We will admit to a telemetry bed. IV Lasix has been given x1 in the ED. Nitroglycerin  paste has been applied to the chest. The patient will have his enzymes cycled and will be diuresed. O2 has been applied.  2. Purpuric rash. The patient's lower extremities are covered in large and small purpuric lesions. Differential includes [facility] Spotted Fever and Coumadin vasculitis. He has only been on Coumadin since mid July. We will resume doxycycline since we are stopping Coumadin and will anticoagulate the patient with subcutaneous Lovenox while we determine the most appropriate anticoagulant to put him on. We will consider Pradaxa and Xarelto. I will ask his cardiologist at [facility] to see him in and weigh in on this  decision.  3. Diabetes mellitus. The patient is currently taking Amaryl and metformin. We will start both of those. It is too early to recheck a Hemoglobin A1c since this was done June. We will check his sugars twice daily while he is here.  4. Coronary artery disease. Repeat fasting lipids will be done in the morning. He is on a beta blocker and is receiving nitroglycerin transdermally here. He is not on an ACE inhibitor and I suspect this may be due to a history of renal insufficiency. We will review his records to see if that is true or not.  5. Atrial fibrillation. Currently controlled on metoprolol. However, his anticoagulation will be stopped due to his purpuric rash and consider Xarelto versus Pradaxa pending evaluation by his cardiologist.   ESTIMATED TIME OF CARE: 60 minutes. ____________________________ Deborra Medina, MD tt:slb D: 10/14/2011 21:47:43 ET T: 10/15/2011 09:55:02 ET JOB#: [id number]  cc: Deborra Medina, MD, <Dictator> Arlis Porta., MD Deborra Medina MD ELECTRONICALLY SIGNED 11/23/2011 13:36

## 2014-06-27 NOTE — Discharge Summary (Signed)
PATIENT NAME:  Corey [name], Corey [name] MR#:  [id number] DATE OF BIRTH:  13-Jul-1935  DATE OF ADMISSION:  09/04/2013 DATE OF DISCHARGE:  09/17/2013  DISPOSITION: Home with hospice vision.   DISCHARGE DIAGNOSES:  1.  Acute on chronic systolic heart failure. 2.   3.  Cardiomyopathy with ejection fraction of 15%, status post automatic implantable cardiac defibrillator.  4.  Coronary artery disease with coronary artery bypass grafting and new drug eluding stent placed on July 6.  5.  Chronic kidney disease, stage II.  6.  Gout.  7.  Type 2 diabetes mellitus.  8.  Hypertension.  9.  Recent pneumonia.   DISCHARGE MEDICATIONS:  1.  Plavix 75 mg p.o. daily. 2.  Polyethylene glycol 17 g as needed for constipation.  3.  Tessalon Perles 100 mg every 6 hours as needed.  4.  Aspirin 81 mg p.o. daily.  5.  Dextromethorphan 10 mg/5 mL, discontinued.  6.  Tylenol as needed for pain and fever 650 mg every 4 hours.  7.  Cardizem 0.6 mg p.o. daily.  8.   Furosemide 40 mg p.o. b.i.d.  9.   Dobutrex drip.  10.  Simvastatin 20 mg p.o. daily.  11.  Zyvox 600 mg p.o. every 12 hours.  12.  Hydralazine 10 mg p.o. t.i.d.  13.  Lasix 40 mg p.o. b.i.d.  14.  Roxanol 20 mg/mL, takes 0.5 mL every 4 hours.  The patient was given 30 mL bottle 5.  Discharged home with hospice and he is discharged with 2 liters of oxygen. Discharged home with torsemide, as well and again Zyvox for 14 days.   CONSULTATIONS:  1.  Cardiology consult with Dr. Esmond Plants.  2.  Palliative care consult with Dr. Ermalinda Memos.  3.  Hospice consult with Jeannie. 4.  The patient also was seen with physical therapy.   HOSPITAL COURSE:  1.  Inotropic dependent CHF. The patient is a 79 year old male patient with a history of severe cardiomyopathy of 10%-15% and a CKD stage III, type 2 diabetes mellitus disease, coronary artery disease, and hypertension sent in from cardiology clinic because of weight gain and trouble breathing and lower extremity edema.   Patient admitted to Intensive Care Unit and patient started on Dobutrex infusion and he continued the Dobutrex and also Lasix.  The patient did not get Coreg because of his low heart functioning.  Patient continued to have poor functioning and trouble breathing, so we continued Dobutrex drip and the patient received Dobutrex drip from July 2 to July 15.  Patient is seen by cardiology on a regular bases and they recommended that we continued Dobutrex drip as an outpatient also because he had severe cardiomyopathy and severe Grade IV CHF. The patient also was given hydralazine and nitrates and the Lasix, and we discharged him home with hospice care. The patient initially wanted to go to nursing home, but finally the family and the patient wanted to go home with hospice and he has an ICD which was done on July 14, as per patient. The patient's overall prognosis was poor and  patient was seen by palliative care and he made his wishes to be DO NOT RESUSCITATE. Patient does have an appointment with Dr. Esmond Plants as an outpatient. He was very miserable and ready for the end at the time of discharge.   2.  Cough, trouble breathing, and fever,   with  pneumonia on x-ray. He received Vancomycin and  Zosyn, and sputum culture showed MRSA, so we  discharged him home with the Zyvox and oxygen.   3.  Severe deconditioning. The patient was seen with physical therapy.   4.  The patient has history of gout and left ankle pain, and we discharged home with colchicine, and the patient has chronic atrial fibrillation.  Poor candidate for anticoagulation, so he did not want any anticoagulation.   5.  The patient had coronary artery disease. The patient did have a cardiac catheterization and drug-eluting stent on July 6. Patient is continued on aspirin and Plavix and statins. The patient had a cardiac catheterization on July 6, and had severely decreased EF and had in-stent restenosis in ostial and medial branches, status post  drug-eluting stent and this was done by Dr. Fletcher Anon.   DISCHARGE VITAL SIGNS: Temperature was 97.4, heart rate 93, blood pressure 111/79, saturations 92 on room air.   TIME SPENT ON DISCHARGE: More than 30 minutes.   CODE STATUS: DO NOT RESUSCITATE.    ____________________________ Epifanio Lesches, MD sk:ts D: 09/18/2013 09:22:36 ET T: 09/18/2013 11:06:04 ET JOB#: [id number]  cc: Epifanio Lesches, MD, <Dictator> Epifanio Lesches MD ELECTRONICALLY SIGNED 09/26/2013 11:21

## 2014-06-27 NOTE — Consult Note (Signed)
General Aspect Patient Name: Corey [name] Date of Encounter: 09/04/2013  Primary Care Provider:  Alisa Graff, MD Primary Cardiologist:  Johnny Bridge, MD    Patient Profile   79 y/o male with h/o CAD, ICM, and recent admission for low-output CHF and cardiorenal syndrome req dobutamine, who presents for f/u.   Past Medical History    ???  Atrial fibrillation         a. s/p ablation, not on anticoagulation s/p Coumadin vasculitis.   ???  CAD (coronary artery disease)         a. s/p 4v CABG w/ redo '06; b. cath 08/14/11 high grade lesion in OM graft, total occlusion of SVG to RCA with collaterals,  patent  LIMA to LAD,occlusion of SVG to Diagonal, and severe segmental LV systolic dysfunction, EF 74%;  c. 03/2013 Cath/PCI: 3VD, LIMA->LAD ok, VG->D1 100, VG->OM1 90 (Xience DES), VG->OM3 90(med Rx), VG->RCA 100.   ???  Ischemic cardiomyopathy         a. EF previously 20-25%;  a. 09/2011 s/p BSX N164 BiV ICD, ser# [id number];  c. 08/2013 Echo: EF 10-15%.   ???  Chronic systolic CHF (congestive heart failure), NYHA class 4         a. 08/2013 Echo: EF 10-15%, restrictive LV diastolic filling, mild MR/TR, mild biatrial enlargement, mild-mod AI, mod-sev PAH--required dobutamine.   ???  HYPERLIPIDEMIA-MIXED     ???  CKD (chronic kidney disease), stage III     ???  Mitral regurgitation         a. s/p MV repair '06;  b. 08/2013 Echo: mild MR.   ???  HTN (hypertension)     ???  COPD (chronic obstructive pulmonary disease)     ???  Complication of anesthesia         "liked to went crazy w/the mess they gave me; started w/an  A"   ???  ICD (implantable cardiac defibrillator) in place  08/24/11       a. 09/2011 s/p BSX N164 BiV ICD, ser# [id number]   ???  Type II diabetes mellitus     ???  Arthritis         "in the neck after neck OR"   ???  GERD (gastroesophageal reflux disease)     ???  Anemia     ???  Colon polyp         a. 08/2012 tubular adenoma x 1.   Present Illness HPI  79 y/o male with end stage  ischemic cardiomyopathy, COPD and pulm HTN,  recently admitted to [facility] in the setting of several wks of progressive dyspnea, weakness, and reduced exercise tolerance.  He also had significant abd discomfort    Creat was elevated secondary to cardiorenal syndrome.  Echo showed EF 10-15% with dilated IVC suggestive of volume overload.  EF was down from prior measurements.  Pt developed dyspnea and was subsequently placed on dobutamine and IV lasix with symptomatic improvement, along with improved renal fxn.  He was later weaned off of dobutamine but w/in 24 hrs felt worse again and he was thus placed back on it.  We was weaned again with stable renal function.    Wt was also stable.     He was d/c'd to rehab on Monday on his usual meds plus lasix 20mg  daily.  since d/c, he has noted progressive decline in fxnl status with return of weakness, malaise, dyspnea, wt gain, and lower ext edema.  He gained 5 lbs overnight  last night.  He reports being on lasix 40 mg BID as an outpt in the past.   Physical Exam:  GEN well developed, well nourished, no acute distress, pleasant, nad.   HEENT hearing intact to voice, moist oral mucosa   NECK supple  no bruits/jvp to jaw.   RESP normal resp effort  clear BS   CARD Regular rate and rhythm  No murmur   ABD soft  normal BS  mild lower abd discomfort.   LYMPH negative neck   EXTR negative cyanosis/clubbing, 3+bilat LE edema to mid calf.   SKIN normal to palpation, No rashes   NEURO grossly intact, nonfocal.   PSYCH alert, A+O to time, place, person, flat affect   Review of Systems:  General: generalized malaise and wkns.   Skin: No Complaints   ENT: No Complaints   Eyes: No Complaints   Neck: No Complaints   Respiratory: Frequent cough  Short of breath   Cardiovascular: Dyspnea  Orthopnea  Edema   Gastrointestinal: mild lower abd discomfort.   Genitourinary: urine has been dark.   Vascular: No Complaints   Musculoskeletal: No  Complaints   Neurologic: No Complaints   Hematologic: No Complaints   Endocrine: No Complaints   Psychiatric: Depression   Review of Systems: All other systems were reviewed and found to be negative   Medications/Allergies Reviewed Medications/Allergies reviewed   Family & Social History:  Family and Social History:  Family History mother: HTN/CA; brother CAD   Social History negative tobacco, negative ETOH, negative Illicit drugs   Place of Living currently @ rehab but prev lived @ home.   Home Medications: Medication Instructions Status  clopidogrel 75 mg oral tablet 1 tab(s) orally once a day Active  nitroglycerin 0.4 mg sublingual tablet 1 tab(s) sublingual every 5 minutes, As Needed - for Chest Pain Active  Metoprolol Tartrate 25 mg oral tablet 1 tab(s) orally 2 times a day Active  glimepiride 4 mg oral tablet 1 tab(s) orally 2 times a day Active  isosorbide mononitrate 60 mg oral tablet, extended release 1 tab(s) orally once a day (in the morning) Active  acetaminophen 500 mg oral tablet 1 tab(s) orally every 4 hours, As Needed  -for Pain Active  benzonatate 200 mg oral capsule 1 cap(s) orally 3 times a day, As Needed Active  colchicine 0.6 mg oral tablet 1 tab(s) orally once a day Active  metolazone 5 mg oral tablet 1 tab(s) orally 2 times a day, As Needed Active  polyethylene glycol 3350 - oral powder for reconstitution 17 gram(s) orally once a day Active  potassium chloride 20 mEq oral tablet, extended release 1 tab(s) orally 2 times a day Active  triazolam 0.25 mg oral tablet 1 tab(s) orally once a day (at bedtime) Active   EKG:  EKG Interp. by me   Interpretation Clinic ECG: V paced, underlying afib, 74.    Coumadin: Other  Lorazepam: Other  Codeine: Other   Impression 1.  Acute on chronic stage IV systolic CHF/ICM:    recent hospitalization during which he required IV dobutamine in the setting of low-output CHF and cardiorenal syndrome.   -Presenting in  the office today with ABD pain prior to and after a BM, weight gain and leg edema.  hemodynamically stable.  -- He was admitted directly to the CCU for PICC placement, labs, and dobutamine infusion with IV lasix.  --Suspect me may benefit from dobutamine infusion as an outpt in an effort to limit readmissions for decompensated  systolic CHF. This would not be a bridge to LVAD.  he may benefit from referral to CHF clinic in [place].   --Would hold coreg in setting of ldobutamine infusion  --Cont hydral/nitrate as pressure allows.   --No acei/arb/spiro in setting of CKD and probable acute worsening (labs pending).  He already has an ICD.   2.  CAD:   No chest pain.  In setting of above, need to continue to consider diagnostic cath if creat stable.  Cont asa, plavix, hold bb as above.  He does not appear to be on a statin.     3.  CKD III:   With acute worsening in the setting of low-output CHF on last admission and concern for recurrent worsening now.  F/U BMET upon arrival to CCU.  4.  Afib:   Chronic underlying afib (V paced otw).  He is not on anticoagulation (severe rxn to warfarin in the past).   He does not want a NOAC. Follow off of BB.  5.  HTN:  Stable.  6.  DM:  Per IM.   Electronic Signatures: Rogelia Mire (NP)  (Signed 02-Jul-15 16:32)  Authored: General Aspect/Present Illness, History and Physical Exam, Review of System, Family & Social History, Home Medications, EKG , Allergies, Impression/Plan Ida Rogue (MD)  (Signed 02-Jul-15 21:58)  Authored: General Aspect/Present Illness, History and Physical Exam, Impression/Plan  Co-Signer: General Aspect/Present Illness, History and Physical Exam, Review of System, Family & Social History, Home Medications, EKG , Allergies, Impression/Plan   Last Updated: 02-Jul-15 21:58 by Ida Rogue (MD)

## 2014-06-27 NOTE — H&P (Signed)
PATIENT NAME:  Corey [name], Corey [name] MR#:  [id number] DATE OF BIRTH:  [birth year]  DATE OF ADMISSION:  08/22/2013  PRIMARY CARE PHYSICIAN: Dr. Einar Pheasant.   CHIEF COMPLAINT: Shortness of breath, generalized weakness.   HISTORY OF PRESENT ILLNESS: This is a 79 year old male who was sent over from his primary care physician's office due to worsening shortness of breath and generalized weakness. The patient went to see his primary care physician a couple of days back, had a chest x-ray done as he was having some worsening lower extremity edema and shortness of breath. They thought he was in CHF and asked him to increase his diuretics. The patient increased his dose of Lasix and also was given metolazone. He says that his shortness of breath has not improved, despite being on an increased dose of diuretics and his ankle edema has returned. He presented back to the doctor's office today, who sent him over to the ER for further evaluation. In the Emergency Room, the patient was noted to be in acute on chronic renal failure with his creatinine as high as 2.5. The patient also continues to still have some paroxysmal nocturnal dyspnea and orthopnea. He was also noted to have a mildly elevated troponin. Hospitalist services were contacted for further treatment and evaluation. The patient admits to generalized weakness, having difficulty ambulating at home. He also feels short of breath, even at minimal exertion and also at rest presently. He denies any chest pain. He admits to nausea, but no vomiting. Positive poor p.o. intake.   REVIEW OF SYSTEMS:  CONSTITUTIONAL:  No documented fever. Positive fatigue and weakness. No weight gain. No weight loss.  EYES: No blurry or double vision.  ENT: No tinnitus. No postnasal drip. No redness of the oropharynx.  RESPIRATORY: Positive cough. No wheeze. No hemoptysis. Positive dyspnea.  CARDIOVASCULAR: No chest pain. Positive 2 pillow orthopnea. Positive paroxysmal nocturnal  dyspnea. No syncope.  GASTROINTESTINAL: Positive nausea. No vomiting. No diarrhea. No abdominal pain. No melena. No hematochezia.  GENITOURINARY: No dysuria. No hematuria.  ENDOCRINE: No polyuria or nocturia. No heat or cold intolerance.   HEMATOLOGIC: No anemia. No bruising. No bleeding.  INTEGUMENTARY: No rashes. No lesions.  MUSCULOSKELETAL: No arthritis. No swelling. Positive gout.  NEUROLOGIC:  No numbness or tingling. No ataxia. No seizure-type activity.  PSYCHIATRIC: No anxiety. No insomnia. No ADD.   PAST MEDICAL HISTORY: Consistent with history of cardiomyopathy, ejection fraction of 25%, status post AICD; history of ischemic cardiomyopathy, history of paroxysmal atrial fibrillation, history of coronary artery disease status post bypass, history of CHF, diabetes, gout, history of Coumadin-induced skin necrosis.   ALLERGIES: CODEINE, COUMADIN AND LORAZEPAM.   SOCIAL HISTORY:  Used to be a smoker, quit 50+ years ago. No alcohol abuse. No illicit drug abuse. Lives at home by himself.   FAMILY HISTORY: Mother and father are both deceased. Father died from pneumonia. Mother died from complications of kidney failure.   CURRENT MEDICATIONS: As follows: Tylenol 500 mg 1 tab every 4 hours as needed, Tessalon Perles 200 mg t.i.d. as needed, Plavix 75 mg daily, colchicine 0.6 mg daily as needed, glimepiride 4 mg b.i.d., Imdur 60 mg daily, metolazone 5 mg b.i.d., metoprolol tartrate 25 mg b.i.d., sublingual nitroglycerin as needed, MiraLAX daily as needed, potassium 12 mEq b.i.d. and triazolam 0.25 mg at bedtime as needed.   PHYSICAL EXAMINATION: Presently is as follows:  VITAL SIGNS:  Noted to be, temperature of 99.3, pulse 87, respirations 20, blood pressure 107/70, sats 97% on 2  liters of nasal cannula.    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  HEAD, EYES, EARS, NOSE AND THROAT: Atraumatic, normocephalic. Extraocular muscles are intact. Pupils equal and  reactive to light. Sclerae anicteric. No conjunctival injection. No pharyngeal erythema.  NECK: Supple. There is no jugular venous distention. No bruits. No lymphadenopathy. No thyromegaly.  HEART: Regular rate and rhythm. No murmurs. No rubs. No clicks.  LUNGS: Poor inspiratory and expiratory effort. Negative use of accessory muscles. No dullness to percussion. No rales or rhonchi. No wheezes.  ABDOMEN: Soft, flat, nontender, nondistended. Has good bowel sounds. No hepatosplenomegaly appreciated.  EXTREMITIES: No evidence of any cyanosis, clubbing. He does have trace pedal edema to the ankles bilaterally, +2 radial and pedal pulses bilaterally.  NEUROLOGICAL: He is alert, awake and oriented x 3 with no focal motor or sensory deficits appreciated bilaterally. Globally weak.  SKIN: Moist and warm with no rashes.  LYMPHATIC: There is no cervical or axillary lymphadenopathy.   LABORATORY DATA: Shows a serum glucose of 270, BUN 56, creatinine 2.5. BNP of 15,000. Sodium 132, potassium 3.1, chloride 92, bicarb 28. LFTs are within normal limits. Troponin 0.21. White cell count 7.3, hemoglobin 15.9, hematocrit 48.3, platelet count of 139.   The patient did have a chest x-ray done which showed suspect mild CHF, some bibasilar interstitial fibrosis. No new opacity. No consolidation.   ASSESSMENT AND PLAN: This is a 79 year old male with history of cardiomyopathy, ejection fraction of 25%, status post automatic implantable cardiac defibrillator; history of paroxysmal atrial fib, history of coronary artery disease, status post bypass; history of congestive heart failure, diabetes, Coumadin-induced skin necrosis, gout, presented to the hospital with shortness of breath and progressive weakness and noted to be in acute on chronic renal failure.  1.  Acute on chronic renal failure. The patient's baseline creatinine is around 1.3 as of January 2015. It is currently elevated at 2.5. This is likely secondary to  overdiuresis with the Lasix and metolazone the patient was taking as an outpatient, complicated with poor p.o. intake; therefore, I will give the patient gentle IV fluids, follow BUN and creatinine and urine output, renal dose meds, avoid nephrotoxins, hold his Lasix and metolazone for now.  2.  Shortness of breath. The exact etiology of this is unclear. Suspected to be mild CHF, as the patient's BNP is elevated, but this could also be chronically elevated due to his cardiomyopathy. The patient also clinically does have paroxysmal nocturnal dyspnea and orthopnea. His chest x-ray shows mild CHF, but negative for any acute pneumonia. It did show some pulmonary fibrosis; therefore, he may have some component of underlying chronic lung disease. For now, we will hold off on giving him any diuretics given his acute renal failure. Continue O2 supplementation and supportive care. If his creatinine starts to improve tomorrow with IV fluids, I would consider starting him on mild dose Lasix.  3. Generalized weakness. This is suspected to be secondary to the renal failure, and also his shortness of breath. I will get a physical therapy consult to assess his mobility.  4.  Elevated troponin, likely in setting of demand ischemia from hypoxemia and also poor renal clearance from renal failure. I will cycle his cardiac markers, keep him on unit telemetry, continue his aspirin, beta blocker and statin and Plavix. I will get a cardiology consult. The patient is well known to [facility] cardiology.  5.  History of paroxysmal atrial fib. The patient is currently rate controlled. Continue  his metoprolol.  6.  Diabetes. Hold his glimepiride for now and place him on sliding scale insulin.  7.  History of gout. No acute gout attack. Hold his colchicine for now.   The patient is a DNI/DNR.   Time spent is 50 minutes.    ____________________________ Belia Heman. Verdell Carmine, MD vjs:dmm D: 08/22/2013 11:40:27 ET T: 08/22/2013 12:12:49  ET JOB#: [id number]  cc: Belia Heman. Verdell Carmine, MD, <Dictator> Henreitta Leber MD ELECTRONICALLY SIGNED 09/02/2013 20:46

## 2014-06-27 NOTE — Discharge Summary (Signed)
PATIENT NAME:  Corey [name], Corey [name] MR#:  [id number] DATE OF BIRTH:  [birth year]  DATE OF ADMISSION:  08/22/2013.  DATE OF DISCHARGE:  09/01/2013.  ADDENDUM:   The patient is being discharge to [facility] for rehab. This is an addendum to the interim discharge summary dictated by Dr. Verdell Carmine on 26th of June.    HOSPITAL COURSE:  From 27th of June to 29th of June, the patient continued to improve in the intensive care unit.  His dobutamine drip was turned off. The patient's creatinine improved. Sodium and potassium remained stable. He was making good urine. He was moved out of the intensive care unit. The patient was also explained there is a possibility down the road he may need dobutamine as IV chronic medication, to be given at home. He understands the risks and complications behind it and the beneficial effects, as well. The patient was seen by physical therapy who recommends rehab. Overall, the patient improved, he is hemodynamically stable.  He will be discharged to [facility] today.   CODE STATUS:  Remains no code, DNR.    DIET:  1.  A 2 grams sodium, ADA, 1800 calorie diet.  2.  Physical therapy.  3.  Follow with Dr. Fletcher Anon or Dr. Rockey Situ in 3 to 4 days.   MEDICATIONS:  1.  Lasix 20 mg p.o. daily.  2.  Colchicine 0.6 mg daily.  3.  Polyethylene glycol powder 17 grams orally daily p.r.n.  4.  Bisacodyl 5 mg p.o. daily p.r.n. 5.  Coreg 6.25 b.i.d.  6.  Hydralazine 10 mg q.i.d.  7.  Tessalon Perles 100 mg q.6 hours p.r.n.  8.  Aspirin 81 mg daily.  9.  Tussionex 5 mL b.i.d. p.r.n.  10. Glimeperide 4 mg p.o. daily.  11. Sliding scale insulin.  12. Dextromethorphan 10 mg b.i.d. p.r.n.  13. Nitrostat 0.4 mg sublingual p.r.n.  14. Halcion 0.25 mg daily at bedtime p.r.n.  15. Imdur 60 mg p.o. daily.  16. Plavix 75 mg daily.  17. Tylenol 650 q.4 hours p.r.n.   The patient completed a course of Zithromax for acute bronchitis. Hospital stay otherwise remained stable.   CODE STATUS:  The  patient remains a No Code, DNR.    TIME SPENT: 40 minutes.   ____________________________ Hart Rochester Posey Pronto, MD sap:lt D: 09/01/2013 10:55:11 ET T: 09/01/2013 11:21:39 ET JOB#: [id number]  cc: Sona A. Posey Pronto, MD, <Dictator> Einar Pheasant, MD Mertie Clause. Fletcher Anon, MD Minna Merritts, MD Ilda Basset MD ELECTRONICALLY SIGNED 09/06/2013 10:32

## 2014-06-27 NOTE — H&P (Signed)
PATIENT NAME:  Corey [name], Corey [name] MR#:  [id number] DATE OF BIRTH:  20-Dec-1935  DATE OF ADMISSION:  09/04/2013  CHIEF COMPLAINT: Weight gain, increased shortness of breath and increasing fatigue.   Corey [name] is a 79 year old Caucasian gentleman with history of severe cardiomyopathy, EF of 10% to 15% by echo in June 2015, status post AICD, history of paroxysmal Afib, comes in, who was recently admitted and discharged on the 29th of June to [facility] for rehab. The patient, at that time, was on IV dobutamine drip and we were able to get him off the drip and be stabilized on cardiac meds. He is admitted directly from Dr. Donivan Scull office after he was seen for increasing weight gain, shortness of breath and increased fatigability. He has had a 5 pound weight gain in the last 24 to 36 hours and he has significant leg edema with shortness of breath. He is being admitted for acute on chronic systolic severe congestive heart failure, which appears to be inotrope dependent.   PAST MEDICAL HISTORY: 1.  Systolic congestive heart failure with severe cardiomyopathy.  2.  History of coronary artery disease with MI in the past.  3.  Chronic Afib.  4.  Hypertension.  5.  Type 2 diabetes.  6.  Cardiac stent and CABG in the past.  7.  History of AICD placement.   ALLERGIES: CODEINE, COUMADIN AND LORAZEPAM.   SOCIAL HISTORY:  Comes in from [facility] skilled facility. Nonsmoker, nonalcoholic. He used to be a smoker in the past. Used to live at home by himself.     FAMILY HISTORY: Mother and father both deceased. Father died from pneumonia, mother died from complications of kidney failure.   MEDICATIONS: 1.  Lasix 20 mg p.o. daily.  2.  Colchicine 0.6 mg daily.  3.  MiraLAX 17 grams orally daily p.r.n.  4.  Bisacodyl 5 mg daily p.r.n.  5.  Coreg 6.25 mg b.i.d.  6.  Hydralazine 10 mg q.i.d.  7.  Tessalon Perles 100 mg q.6 p.r.n.  8.  Aspirin 81 mg daily.  9.  Tussionex 5 mL b.i.d. p.r.n.  10.  Glimepiride 4 mg  daily.  11.  Sliding scale insulin.  12.  Dextromethorphan 10 mg b.i.d. p.r.n.  13.  Nitrostat sublingual 0.4 mg sublingual p.r.n.  14.  Halcion 0.25 mg daily at bedtime p.r.n.  15.  Imdur 60 mg p.o. daily.  16.  Plavix 75 mg daily.  17a  Tylenol 650 mg p.o. q.4 p.r.n.   CODE STATUS: NO CODE, DNR.   REVIEW OF SYSTEMS:  CONSTITUTIONAL: No fever. Positive for fatigue, weakness and weight gain and leg edema.  EYES: No blurred or double vision.  ENT: No tinnitus, ear pain, hearing loss.  RESPIRATORY: No cough, wheeze, hemoptysis. Positive for shortness of breath.  CARDIOVASCULAR: No chest pain. Positive for hypertension, Afib and leg edema.  GASTROINTESTINAL: No nausea, vomiting, diarrhea, abdominal pain. No GERD.  GENITOURINARY: No dysuria or hematuria.  ENDOCRINE: No polyuria, nocturia or thyroid problems.  HEMATOLOGY: No anemia or easy bruising. No bleeding.  SKIN: No acne or rash.  MUSCULOSKELETAL: Positive for arthritis and pain and swelling of the left ankle, likely secondary to gout.  NEUROLOGIC: No CVA, TIA, dysarthria or tremors.  PSYCHIATRIC: No anxiety, depression, bipolar disorder. All other systems reviewed and negative.   PHYSICAL EXAMINATION: GENERAL: The patient is awake, alert, oriented x 3, not in acute distress.  VITAL SIGNS: Afebrile. Pulse is 73 with paced rhythm. Blood pressure is 108/74. Sats are  99% on room air.  HEENT: Atraumatic, normocephalic.  PERRLA.  EOM intact. Oral mucosa is moist.  NECK: Supple.  Positive for JVD.  RESPIRATORY: Decreased breath sounds in the bases. A few bibasilar rales present. No respiratory distress or labored breathing.  CARDIOVASCULAR:  Both of the heart sounds are normal. Rate, rhythm regular. PMI not lateralized. Chest nontender.  EXTREMITIES: The patient has 4+ pitting edema with feeble pedal pulses, good femoral pulses.  SKIN:  Warm and dry.  ABDOMEN: Soft, benign, nontender. No organomegaly.  NEUROLOGIC: Grossly intact  cranial nerves 2 through 12. No motor or sensory deficit.  PSYCHIATRIC: The patient is awake, alert, oriented x 3.   LABORATORY DATA:  CBC and basic metabolic panel are pending.   ASSESSMENT AND PLAN: A 79 year old Mr. Tymothy Cass with history of severe cardiomyopathy systolic, ejection fraction of 10% to 15%, history of coronary artery disease status post coronary artery bypass graft and stent in the past, diabetes, comes in with increasing shortness of breath and increasing leg edema. He is being admitted with:  1.  Acute on chronic systolic severe congestive heart failure. Echo shows worsening EF of 10% to 15% and has low output CHF. The patient will be placed back on dobutamine drip due to his worsening symptoms of shortness of breath and significant leg edema with weight gain. He appears to be now inotrope dependent.  The patient was tried to be off the dobutamine during the last admission; however, it appears his condition is progressively getting worse and needs to be on dobutamine drip for now. We will get a PICC line placed. The patient was explained the risks and benefits, is agreeable to it. We will continue Coreg, Imdur and hydralazine as blood pressure allows. Cardiology consultation with Dr. Rockey Situ.  2.  Generalized weakness, likely related to #1. We will have physical therapy see the patient in a day or 2. He is from [employer].  3.  Chronic kidney disease stage III. The patient's metabolic is still pending. This is likely due to his low output CHF, cardiorenal in nature. We will resume his Lasix at this time and see how he does with dobutamine and I will monitor creatinine, I's and O's and avoid nephrotoxins.  4.  History of atrial fib,  rate controlled. Continue Coreg.  5.  Type 2 diabetes. We will place the patient on sliding scale insulin and then resume glimepiride depending on the patient's blood sugars.  6.  History of gout with left ankle pain. We will give Ultram at this time. I  will start back on colchicine if his creatinine is stable; else, we will hold it.  7.  History of coronary artery disease. Continue his aspirin and Plavix.  8.  Deep vein thrombosis prophylaxis, subQ heparin.   CODE STATUS: NO CODE, DNR.   Further workup according to the patient's clinical course. Hospital admission plan was discussed with the patient. No family members are present at this time.   CRITICAL TIME SPENT:  55 minutes.    ____________________________ Hart Rochester Posey Pronto, MD sap:dmm D: 09/04/2013 16:57:33 ET T: 09/04/2013 18:03:48 ET JOB#: [id number]  cc: Zhanna Melin A. Posey Pronto, MD, <Dictator> Ilda Basset MD ELECTRONICALLY SIGNED 09/06/2013 10:36
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
## 2014-06-27 NOTE — Discharge Summary (Signed)
Dates of Admission and Diagnosis:  Date of Admission 21-Mar-2013   Date of Discharge 22-Mar-2013   Admitting Diagnosis Class 3 angina in spite of maximal antianginal therapy with previous CABG   Final Diagnosis Same as above   Discharge Diagnosis 1 hypertension   2 chronic systolic heart failure    Chief Complaint/History of Present Illness 79 year old male presented with progressive dyspnea with minimal activities flet to be due to angina in spite of maximal anti -anginal therapy. He is known to have borderline  significant stenosis in SVG to OM from 08/2011. Thus, cardiac cath was recommended. He has known chronic kidney disease. Thus, he was hydrated. Contrast load was minimized.   TDMs:  17-Jan-15 05:33   Digoxin, Serum 1.11 (Therapeutic range for digoxin in patients with atrial fibrillation: 0.8 - 2.0 ng/mL. In patients with congestive heart failure a therapeutic range of 0.5 - 0.8 ng/mL is suggested as higher levels are associated with an increased risk of toxicity without clear evidence of enhanced efficacy. Digoxin toxicity is commonly associated with serum levels > 2.0 ng/mL but may occur with lower levels, including those in the therapeutic range. Blood samples should be obtained 6-8 hours after administration to assure a reasonable volume of distribution.)  Routine Chem:  17-Jan-15 05:33   Glucose, Serum  166  BUN  24  Creatinine (comp)  1.36  Sodium, Serum  132  Potassium, Serum  3.2  Chloride, Serum  97  CO2, Serum 29  Calcium (Total), Serum 8.9  Anion Gap  6  Osmolality (calc) 272  eGFR (African American)  59  eGFR (Non-African American)  51 (eGFR values <43m/min/1.73 m2 may be an indication of chronic kidney disease (CKD). Calculated eGFR is useful in patients with stable renal function. The eGFR calculation will not be reliable in acutely ill patients when serum creatinine is changing rapidly. It is not useful in  patients on dialysis. The eGFR  calculation may not be applicable to patients at the low and high extremes of body sizes, pregnant women, and vegetarians.)   Hospital Course:  Hospital Course He underwent cardiac cath which showed occluded native vessels with known occluded SVG to diagonal and SVG to RCA. LIMA to LAD was patent. SVG to OM2/Om3 was found to have a 90% mid stenosis supply a large territory. PCI with DES using a distal protection device was performed without complications.  He was hydrated overnight. Renal function improved. He will be discharged home on Plavix. He complained of pain and swelling at the base of the right big toe. This is likely due to gout. I will give him 3 days of Prednisone until he follows with his PCP.   Condition on Discharge Good   DISCHARGE INSTRUCTIONS HOME MEDS:  Medication Reconciliation: Patient's Home Medications at Discharge:     Medication Instructions  metoprolol tartrate 50 mg oral tablet  0.5 tab(s) orally 2 times a day   glimepiride 4 mg oral tablet  2 tab(s) orally once a day   aspirin 81 mg oral tablet  1 tab(s) orally once a day   digoxin 125 mcg (0.125 mg) oral tablet  1 tab(s) orally once a day   furosemide 40 mg oral tablet  1 tab(s) orally 2 times a day   isosorbide mononitrate 60 mg oral tablet, extended release  1.5 tab(s) orally once a day (in the morning)   nitroglycerin 0.4 mg sublingual tablet  1 tab(s) sublingual every 5 minutes, As Needed - for Chest Pain  clopidogrel 75 mg oral tablet  1 tab(s) orally once a day   prednisone 20 mg oral tablet  1 tab(s) orally once a day    PRESCRIPTIONS: ELECTRONICALLY SUBMITTED  STOP TAKING THE FOLLOWING MEDICATION(S):   Do not resume Metfomin until we check your kidney function upon follow up. Predisone 20 mg once daily for 5 days only. (for gout)  Physician's Instructions:  Home Health? No   Home Oxygen? No   Diet Low Sodium   Activity Limitations As tolerated   Return to Work Not Applicable   Time frame  for Follow Up Appointment 1-2 weeks   Electronic Signatures: Kathlyn Sacramento (MD)  (Signed 17-Jan-15 09:58)  Authored: ADMISSION DATE AND DIAGNOSIS, CHIEF COMPLAINT/HPI, PERTINENT [place] MED[facility], PATIENT INSTRUCTIONS   Last Updated: 17-Jan-15 09:58 by Kathlyn Sacramento (MD)

## 2015-01-08 ENCOUNTER — Encounter: Payer: Self-pay | Admitting: Cardiovascular Disease
